# Patient Record
Sex: Female | Born: 1970 | Race: Black or African American | Hispanic: No | Marital: Single | Smoking: Current every day smoker
Health system: Southern US, Community
[De-identification: ages and names within clinical notes are randomized; demographics above are authoritative.]

## PROBLEM LIST (undated history)

## (undated) ENCOUNTER — Emergency Department (HOSPITAL_COMMUNITY): Admission: EM | Payer: Self-pay | Source: Home / Self Care

## (undated) DIAGNOSIS — I341 Nonrheumatic mitral (valve) prolapse: Secondary | ICD-10-CM

## (undated) DIAGNOSIS — I10 Essential (primary) hypertension: Secondary | ICD-10-CM

## (undated) DIAGNOSIS — J42 Unspecified chronic bronchitis: Secondary | ICD-10-CM

## (undated) DIAGNOSIS — D649 Anemia, unspecified: Secondary | ICD-10-CM

## (undated) DIAGNOSIS — L03011 Cellulitis of right finger: Secondary | ICD-10-CM

## (undated) DIAGNOSIS — F319 Bipolar disorder, unspecified: Secondary | ICD-10-CM

## (undated) DIAGNOSIS — J45909 Unspecified asthma, uncomplicated: Secondary | ICD-10-CM

## (undated) DIAGNOSIS — F419 Anxiety disorder, unspecified: Secondary | ICD-10-CM

## (undated) HISTORY — DX: Anxiety disorder, unspecified: F41.9

## (undated) HISTORY — PX: NO PAST SURGERIES: SHX2092

---

## 1998-10-28 ENCOUNTER — Emergency Department (HOSPITAL_COMMUNITY): Admission: EM | Admit: 1998-10-28 | Discharge: 1998-10-28 | Payer: Self-pay | Admitting: Emergency Medicine

## 1999-03-27 ENCOUNTER — Emergency Department (HOSPITAL_COMMUNITY): Admission: EM | Admit: 1999-03-27 | Discharge: 1999-03-27 | Payer: Self-pay | Admitting: Emergency Medicine

## 1999-04-29 ENCOUNTER — Encounter: Payer: Self-pay | Admitting: Family Medicine

## 1999-04-29 ENCOUNTER — Ambulatory Visit (HOSPITAL_COMMUNITY): Admission: RE | Admit: 1999-04-29 | Discharge: 1999-04-29 | Payer: Self-pay | Admitting: Family Medicine

## 1999-05-22 ENCOUNTER — Other Ambulatory Visit: Admission: RE | Admit: 1999-05-22 | Discharge: 1999-05-22 | Payer: Self-pay | Admitting: Family Medicine

## 1999-08-11 ENCOUNTER — Inpatient Hospital Stay (HOSPITAL_COMMUNITY): Admission: AD | Admit: 1999-08-11 | Discharge: 1999-08-11 | Payer: Self-pay | Admitting: Family Medicine

## 1999-09-12 ENCOUNTER — Emergency Department (HOSPITAL_COMMUNITY): Admission: EM | Admit: 1999-09-12 | Discharge: 1999-09-12 | Payer: Self-pay | Admitting: Emergency Medicine

## 2000-05-28 ENCOUNTER — Emergency Department (HOSPITAL_COMMUNITY): Admission: EM | Admit: 2000-05-28 | Discharge: 2000-05-28 | Payer: Self-pay | Admitting: Emergency Medicine

## 2000-05-28 ENCOUNTER — Emergency Department (HOSPITAL_COMMUNITY): Admission: EM | Admit: 2000-05-28 | Discharge: 2000-05-29 | Payer: Self-pay | Admitting: Emergency Medicine

## 2000-05-29 ENCOUNTER — Emergency Department (HOSPITAL_COMMUNITY): Admission: EM | Admit: 2000-05-29 | Discharge: 2000-05-29 | Payer: Self-pay | Admitting: Emergency Medicine

## 2001-12-02 ENCOUNTER — Other Ambulatory Visit: Admission: RE | Admit: 2001-12-02 | Discharge: 2001-12-02 | Payer: Self-pay | Admitting: Family Medicine

## 2002-04-15 ENCOUNTER — Encounter: Admission: RE | Admit: 2002-04-15 | Discharge: 2002-04-15 | Payer: Self-pay | Admitting: Family Medicine

## 2002-04-15 ENCOUNTER — Encounter: Payer: Self-pay | Admitting: Family Medicine

## 2002-09-03 ENCOUNTER — Emergency Department (HOSPITAL_COMMUNITY): Admission: EM | Admit: 2002-09-03 | Discharge: 2002-09-03 | Payer: Self-pay | Admitting: Emergency Medicine

## 2002-10-13 ENCOUNTER — Ambulatory Visit (HOSPITAL_COMMUNITY): Admission: RE | Admit: 2002-10-13 | Discharge: 2002-10-13 | Payer: Self-pay

## 2002-12-14 ENCOUNTER — Ambulatory Visit (HOSPITAL_COMMUNITY): Admission: RE | Admit: 2002-12-14 | Discharge: 2002-12-14 | Payer: Self-pay

## 2003-02-12 ENCOUNTER — Ambulatory Visit (HOSPITAL_COMMUNITY): Admission: RE | Admit: 2003-02-12 | Discharge: 2003-02-12 | Payer: Self-pay

## 2003-03-25 ENCOUNTER — Encounter (HOSPITAL_COMMUNITY): Admission: RE | Admit: 2003-03-25 | Discharge: 2003-04-07 | Payer: Self-pay

## 2003-04-14 ENCOUNTER — Inpatient Hospital Stay (HOSPITAL_COMMUNITY): Admission: AD | Admit: 2003-04-14 | Discharge: 2003-04-16 | Payer: Self-pay

## 2003-04-14 ENCOUNTER — Encounter: Admission: RE | Admit: 2003-04-14 | Discharge: 2003-04-14 | Payer: Self-pay

## 2003-04-15 ENCOUNTER — Encounter (INDEPENDENT_AMBULATORY_CARE_PROVIDER_SITE_OTHER): Payer: Self-pay | Admitting: *Deleted

## 2004-12-13 ENCOUNTER — Emergency Department (HOSPITAL_COMMUNITY): Admission: EM | Admit: 2004-12-13 | Discharge: 2004-12-13 | Payer: Self-pay | Admitting: Emergency Medicine

## 2005-01-22 ENCOUNTER — Emergency Department (HOSPITAL_COMMUNITY): Admission: EM | Admit: 2005-01-22 | Discharge: 2005-01-23 | Payer: Self-pay | Admitting: Emergency Medicine

## 2005-04-24 ENCOUNTER — Other Ambulatory Visit: Admission: RE | Admit: 2005-04-24 | Discharge: 2005-04-24 | Payer: Self-pay | Admitting: Family Medicine

## 2005-12-28 ENCOUNTER — Emergency Department (HOSPITAL_COMMUNITY): Admission: EM | Admit: 2005-12-28 | Discharge: 2005-12-28 | Payer: Self-pay | Admitting: Emergency Medicine

## 2006-04-30 ENCOUNTER — Inpatient Hospital Stay (HOSPITAL_COMMUNITY): Admission: RE | Admit: 2006-04-30 | Discharge: 2006-05-02 | Payer: Self-pay | Admitting: Psychiatry

## 2006-05-01 ENCOUNTER — Ambulatory Visit: Payer: Self-pay | Admitting: Psychiatry

## 2007-01-03 ENCOUNTER — Emergency Department (HOSPITAL_COMMUNITY): Admission: EM | Admit: 2007-01-03 | Discharge: 2007-01-03 | Payer: Self-pay | Admitting: Emergency Medicine

## 2007-05-14 ENCOUNTER — Emergency Department (HOSPITAL_COMMUNITY): Admission: EM | Admit: 2007-05-14 | Discharge: 2007-05-14 | Payer: Self-pay | Admitting: Emergency Medicine

## 2007-06-28 ENCOUNTER — Emergency Department (HOSPITAL_COMMUNITY): Admission: EM | Admit: 2007-06-28 | Discharge: 2007-06-28 | Payer: Self-pay | Admitting: Emergency Medicine

## 2008-11-12 ENCOUNTER — Inpatient Hospital Stay (HOSPITAL_COMMUNITY): Admission: RE | Admit: 2008-11-12 | Discharge: 2008-11-12 | Payer: Self-pay | Admitting: Obstetrics & Gynecology

## 2009-07-15 ENCOUNTER — Emergency Department (HOSPITAL_COMMUNITY): Admission: EM | Admit: 2009-07-15 | Discharge: 2009-07-15 | Payer: Self-pay | Admitting: Emergency Medicine

## 2009-07-16 ENCOUNTER — Emergency Department (HOSPITAL_COMMUNITY): Admission: EM | Admit: 2009-07-16 | Discharge: 2009-07-16 | Payer: Self-pay | Admitting: Emergency Medicine

## 2009-10-22 ENCOUNTER — Inpatient Hospital Stay (HOSPITAL_COMMUNITY): Admission: EM | Admit: 2009-10-22 | Discharge: 2009-10-24 | Payer: Self-pay | Admitting: Emergency Medicine

## 2010-04-19 ENCOUNTER — Emergency Department (HOSPITAL_COMMUNITY): Admission: EM | Admit: 2010-04-19 | Discharge: 2010-04-19 | Payer: Self-pay | Admitting: Emergency Medicine

## 2011-03-14 LAB — URINALYSIS, ROUTINE W REFLEX MICROSCOPIC
Glucose, UA: NEGATIVE mg/dL
Hgb urine dipstick: NEGATIVE
Hgb urine dipstick: NEGATIVE
Ketones, ur: 15 mg/dL — AB
Nitrite: NEGATIVE
Nitrite: NEGATIVE
Protein, ur: 30 mg/dL — AB
Protein, ur: NEGATIVE mg/dL
Specific Gravity, Urine: 1.005 (ref 1.005–1.030)
Specific Gravity, Urine: 1.015 (ref 1.005–1.030)
Urobilinogen, UA: 0.2 mg/dL (ref 0.0–1.0)
Urobilinogen, UA: 1 mg/dL (ref 0.0–1.0)
pH: 5.5 (ref 5.0–8.0)

## 2011-03-14 LAB — BRAIN NATRIURETIC PEPTIDE: Pro B Natriuretic peptide (BNP): 117 pg/mL — ABNORMAL HIGH (ref 0.0–100.0)

## 2011-03-14 LAB — COMPREHENSIVE METABOLIC PANEL
ALT: 21 U/L (ref 0–35)
ALT: 21 U/L (ref 0–35)
ALT: 27 U/L (ref 0–35)
AST: 34 U/L (ref 0–37)
AST: 46 U/L — ABNORMAL HIGH (ref 0–37)
AST: 54 U/L — ABNORMAL HIGH (ref 0–37)
Albumin: 3.1 g/dL — ABNORMAL LOW (ref 3.5–5.2)
Albumin: 3.4 g/dL — ABNORMAL LOW (ref 3.5–5.2)
Albumin: 4.5 g/dL (ref 3.5–5.2)
Alkaline Phosphatase: 66 U/L (ref 39–117)
Alkaline Phosphatase: 71 U/L (ref 39–117)
Alkaline Phosphatase: 84 U/L (ref 39–117)
BUN: 11 mg/dL (ref 6–23)
BUN: 23 mg/dL (ref 6–23)
CO2: 20 mEq/L (ref 19–32)
CO2: 23 mEq/L (ref 19–32)
CO2: 23 mEq/L (ref 19–32)
Calcium: 8 mg/dL — ABNORMAL LOW (ref 8.4–10.5)
Calcium: 8.6 mg/dL (ref 8.4–10.5)
Calcium: 9.8 mg/dL (ref 8.4–10.5)
Chloride: 103 mEq/L (ref 96–112)
Chloride: 90 mEq/L — ABNORMAL LOW (ref 96–112)
Chloride: 99 mEq/L (ref 96–112)
Creatinine, Ser: 0.97 mg/dL (ref 0.4–1.2)
Creatinine, Ser: 3.67 mg/dL — ABNORMAL HIGH (ref 0.4–1.2)
GFR calc Af Amer: 17 mL/min — ABNORMAL LOW (ref >60)
GFR calc Af Amer: >60 mL/min (ref >60)
GFR calc non Af Amer: 14 mL/min — ABNORMAL LOW (ref >60)
GFR calc non Af Amer: >60 mL/min (ref >60)
GFR calc non Af Amer: >60 mL/min (ref >60)
Glucose, Bld: 77 mg/dL (ref 70–99)
Glucose, Bld: 84 mg/dL (ref 70–99)
Glucose, Bld: 97 mg/dL (ref 70–99)
Potassium: 3.8 mEq/L (ref 3.5–5.1)
Potassium: 3.9 mEq/L (ref 3.5–5.1)
Sodium: 125 mEq/L — ABNORMAL LOW (ref 135–145)
Sodium: 127 mEq/L — ABNORMAL LOW (ref 135–145)
Sodium: 131 mEq/L — ABNORMAL LOW (ref 135–145)
Total Bilirubin: 0.5 mg/dL (ref 0.3–1.2)
Total Bilirubin: 0.8 mg/dL (ref 0.3–1.2)
Total Protein: 6.3 g/dL (ref 6.0–8.3)
Total Protein: 8.2 g/dL (ref 6.0–8.3)

## 2011-03-14 LAB — CBC
HCT: 28.4 % — ABNORMAL LOW (ref 36.0–46.0)
Hemoglobin: 9.7 g/dL — ABNORMAL LOW (ref 12.0–15.0)
MCHC: 34.2 g/dL (ref 30.0–36.0)
MCV: 94.2 fL (ref 78.0–100.0)
MCV: 95.7 fL (ref 78.0–100.0)
MCV: 96 fL (ref 78.0–100.0)
Platelets: 175 10*3/uL (ref 150–400)
Platelets: 187 10*3/uL (ref 150–400)
WBC: 5.6 10*3/uL (ref 4.0–10.5)
WBC: 7.6 10*3/uL (ref 4.0–10.5)

## 2011-03-14 LAB — FOLATE RBC: RBC Folate: 257 ng/mL (ref 180–600)

## 2011-03-14 LAB — URINE MICROSCOPIC-ADD ON

## 2011-03-14 LAB — DIFFERENTIAL
Basophils Absolute: 0.1 10*3/uL (ref 0.0–0.1)
Basophils Relative: 1 % (ref 0–1)
Basophils Relative: 1 % (ref 0–1)
Eosinophils Absolute: 0 10*3/uL (ref 0.0–0.7)
Eosinophils Absolute: 0.1 10*3/uL (ref 0.0–0.7)
Eosinophils Relative: 1 % (ref 0–5)
Lymphocytes Relative: 38 % (ref 12–46)
Lymphs Abs: 2.4 10*3/uL (ref 0.7–4.0)
Lymphs Abs: 2.9 10*3/uL (ref 0.7–4.0)
Monocytes Absolute: 0.7 10*3/uL (ref 0.1–1.0)
Monocytes Relative: 10 % (ref 3–12)
Neutro Abs: 2.4 10*3/uL (ref 1.7–7.7)
Neutro Abs: 3.9 10*3/uL (ref 1.7–7.7)
Neutrophils Relative %: 43 % (ref 43–77)
Neutrophils Relative %: 51 % (ref 43–77)

## 2011-03-14 LAB — POCT PREGNANCY, URINE: Preg Test, Ur: NEGATIVE

## 2011-03-14 LAB — POCT CARDIAC MARKERS
CKMB, poc: 3.4 ng/mL (ref 1.0–8.0)
CKMB, poc: 5 ng/mL (ref 1.0–8.0)
Myoglobin, poc: 476 ng/mL (ref 12–200)
Myoglobin, poc: >500 ng/mL (ref 12–200)
Troponin i, poc: <0.05 ng/mL (ref 0.00–0.09)
Troponin i, poc: <0.05 ng/mL (ref 0.00–0.09)

## 2011-03-14 LAB — CARDIAC PANEL(CRET KIN+CKTOT+MB+TROPI)
CK, MB: 6.8 ng/mL — ABNORMAL HIGH (ref 0.3–4.0)
Relative Index: 3.4 — ABNORMAL HIGH (ref 0.0–2.5)
Total CK: 199 U/L — ABNORMAL HIGH (ref 7–177)
Troponin I: 0.01 ng/mL (ref 0.00–0.06)

## 2011-03-14 LAB — POCT I-STAT, CHEM 8
BUN: 24 mg/dL — ABNORMAL HIGH (ref 6–23)
Creatinine, Ser: 3.8 mg/dL — ABNORMAL HIGH (ref 0.4–1.2)
Glucose, Bld: 92 mg/dL (ref 70–99)
Sodium: 124 mEq/L — ABNORMAL LOW (ref 135–145)
TCO2: 22 mmol/L (ref 0–100)

## 2011-03-14 LAB — IRON AND TIBC
Iron: 71 ug/dL (ref 42–135)
Saturation Ratios: 33 % (ref 20–55)
TIBC: 213 ug/dL — ABNORMAL LOW (ref 250–470)
UIBC: 142 ug/dL

## 2011-03-14 LAB — T3, FREE: T3, Free: 2.7 pg/mL (ref 2.3–4.2)

## 2011-03-14 LAB — URINE CULTURE
Colony Count: NO GROWTH
Culture: NO GROWTH

## 2011-03-14 LAB — RETICULOCYTES
RBC.: 3.09 MIL/uL — ABNORMAL LOW (ref 3.87–5.11)
Retic Count, Absolute: 12.4 10*3/uL — ABNORMAL LOW (ref 19.0–186.0)

## 2011-03-14 LAB — CORTISOL-AM, BLOOD: Cortisol - AM: 20.9 ug/dL (ref 4.3–22.4)

## 2011-03-14 LAB — ALDOSTERONE: Aldosterone, Serum: 4 ng/dL

## 2011-04-27 NOTE — H&P (Signed)
NAMEJACQELINE, [name]                         ACCOUNT NO.:  [id number]   MEDICAL RECORD NO.:  [id number]                   PATIENT TYPE:  INP   LOCATION:  9169                                 FACILITY:  [facility]   PHYSICIAN:  Ronda Fairly. Galen Daft, M.D.              DATE OF BIRTH:  [birth year]   DATE OF ADMISSION:  04/14/2003  DATE OF DISCHARGE:                                HISTORY & PHYSICAL   CHIEF COMPLAINT:  Abnormal NST .   HISTORY OF PRESENT ILLNESS:  This patient is 38 weeks, 5 five days gestation  based on first trimester ultrasound, November 4, which was equal to 12 weeks  and 4 days.  She has had an unremarkable prenatal course with the exception  of being small for dates and ultrasounds were therefore implemented for  serial testing as well as a nonstress test weekly.  She is a smoker and she  was counseled about that.  She has a history of syphilis treated and she was  negative this pregnancy.  The patient is negative for GBS.  Patient had a  measurement of 33+ weeks on 03/24/03.  This was an ultrasound where we  implemented testing.  She had had testing without difficulty up until today.  The NST today showed some variables.   PRENATAL LABORATORY:  Blood type B positive.  Antibody screen negative.  Hemoglobin is 10.3.  Rubella immune.  RPR nonreactive.  Hepatitis B  negative.  HIV negative.  Chlamydia negative.  Gonorrhea negative.  Pap  smear within normal limits.  Group-B strep negative.  Triple screen within  normal limits.   PAST MEDICAL HISTORY:  Syphilis, smoker.  Otherwise unremarkable.   PAST OBSTETRIC HISTORY:  Para 1, spontaneous vaginal delivery, baby boy,  uncomplicated pregnancy.  He weighed 6 pounds 7 ounces in 1997.  No prior  miscarriages.  No congenital abnormalities.   FAMILY HISTORY:  Heart disease.  Otherwise unremarkable.   PHYSICAL EXAMINATION:  GENERAL:  Alert, oriented, no apparent distress.  HEENT:  Head and neck examination was negative.  RESPIRATORY:  Clear.  CARDIAC:  Regular, rate and rhythm.  ABDOMEN:  Soft, nontender.  Estimated fetal weight 6 pounds.  Vertex  presentation.  PELVIC:  Cervix is posterior, ripe, firm and approximately 1-2 cm.  The  cervix is less then 50% effaced.  Multiparous.  The vertex presenting and at  -2.  The external vaginal area had no lesions suggestive of herpes or other  acute infection.  The patient has normal external genitalia.  No lesions of  the vagina.  There was no evidence of ruptured membranes by examination.  Negative nitrazine, negative pooling.  The fetal heart rate is 140s-150s  with occasional variable decelerations.  Slow return occasionally.  Otherwise unremarkable.  There are contractions without __________  .   ASSESSMENT:  A 38 week 5 day gestation pregnancy with nonreactive nonstress  test today and she  is therefore set up for suction for borderline IUGR and  growth restriction with unreassuring test.                                               Ronda Fairly. Galen Daft, M.D.   NJT/MEDQ  D:  04/14/2003  T:  04/14/2003  Job:  [id number]

## 2011-04-27 NOTE — Discharge Summary (Signed)
[name], MALACARA NO.:  [id number]   MEDICAL RECORD NO.:  [id number]          PATIENT TYPE:  IPS   LOCATION:  0401                          FACILITY:  [facility]   PHYSICIAN:  Geoffery Lyons, M.D.      DATE OF BIRTH:  26-Oct-1971   DATE OF ADMISSION:  04/30/2006  DATE OF DISCHARGE:  05/02/2006                                 DISCHARGE SUMMARY   CHIEF COMPLAINT AND PRESENT ILLNESS:  This was the first admission to [facility] for this 40 year old single African-American female  voluntarily admitted.  History of depression, crying spells, not sleep.  Denied suicidal ideation.  Chart indicates positive for auditory  hallucinations, recently laid off from work.  That is the major stressor.  History of schizophrenia in the family.  Per chart, she drinks two 40-ounces  per day.   PAST PSYCHIATRIC HISTORY:  First time at [facility].  No previous  treatment.   ALCOHOL/DRUG HISTORY:  Claimed occasional use of alcohol.  No other  substances.   MEDICAL HISTORY:  Arterial hypertension.   MEDICATIONS:  Hydrochlorothiazide 25 mg per day, Xanax 0.25 mg twice a day  for seven years, Zoloft 50 mg daily for the last three years.   PHYSICAL EXAMINATION:  Performed and failed to show any acute findings.   LABORATORY DATA:  Not available in the chart.   MENTAL STATUS EXAM:  Fully alert, cooperative female.  Little eye contact.  Speech clear, normal rate, tempo and production.  Mood depressed.  Affect  constricted.  Thought processes logical, coherent and relevant.  Claims no  hallucinations but earlier there was some information that she was indeed  having some.  Denied any suicidal or homicidal ideation.  Cognition was well-  preserved.   ADMISSION DIAGNOSES:  AXIS I:  Major depression, rule out psychotic  features.  AXIS II:  No diagnosis.  AXIS III:  Arterial hypertension.  AXIS IV:  Moderate.  AXIS V:  GAF upon admission 35; highest GAF in the last  year 60.   HOSPITAL COURSE:  She was admitted.  She was started in individual and group  psychotherapy.  She was given Ambien for sleep.  She was given Zyprexa Zydis  5 mg at bedtime.  Xanax was discontinued as she was given some Librium as  needed.  She endorsed that she got depressed by taking Zoloft and Xanax.  Just said she was doing okay and she did not want to take the Zoloft  anymore.  Got more depressed after she discontinued the medication.  Was  crying with a lot of sadness, decreased sleep, decreased energy, decreased  motivation.  Denied any auditory hallucinations but there was some report of  the same.  On evaluation, she was reserved, guarded, affect was constricted.  Although she denies any hallucinations while she was talking, she repeated  taps the top of her head in some sort of pattern.  Endorsed financial  difficulties, being unemployed.  Her job ceased, several people were let go  of.  She stated that they are probably going to be rehired  once the summer  is over but endorsed that she needed to find another job as financially she  cannot cope.  On May 02, 2006, objectively, she was better.  There were no  suicidal or homicidal ideation.  No hallucinations.  No delusions.  Slept  through the night.  She was willing to continue the Zoloft and the Zyprexa  as she felt it was helping her with sleep as well as the anxiety and felt  much clearer.  No evidence of the previously described behavior.   DISCHARGE DIAGNOSES:  AXIS I:  Major depression, rule out psychotic  features.  AXIS II:  No diagnosis.  AXIS III:  Arterial hypertension.  AXIS IV:  Moderate.  AXIS V:  GAF upon discharge 50-55.   DISCHARGE MEDICATIONS:  1.  Ambien 10 mg at night.  2.  Zoloft 50 mg per day.  3.  Hydrochlorothiazide 25 mg per day.  4.  Zyprexa Zydis 5 mg at night.   FOLLOWUP:  [facility].      Geoffery Lyons, M.D.  Electronically Signed     IL/MEDQ  D:  05/16/2006  T:   05/17/2006  Job:  [id number]

## 2011-06-16 ENCOUNTER — Emergency Department (HOSPITAL_COMMUNITY)
Admission: EM | Admit: 2011-06-16 | Discharge: 2011-06-16 | Disposition: A | Discharge status: Home / Self Care | Payer: Self-pay | Attending: Emergency Medicine | Admitting: Emergency Medicine

## 2011-06-16 DIAGNOSIS — F329 Major depressive disorder, single episode, unspecified: Secondary | ICD-10-CM | POA: Insufficient documentation

## 2011-06-16 DIAGNOSIS — T63391A Toxic effect of venom of other spider, accidental (unintentional), initial encounter: Secondary | ICD-10-CM | POA: Insufficient documentation

## 2011-06-16 DIAGNOSIS — L298 Other pruritus: Secondary | ICD-10-CM | POA: Insufficient documentation

## 2011-06-16 DIAGNOSIS — T6391XA Toxic effect of contact with unspecified venomous animal, accidental (unintentional), initial encounter: Secondary | ICD-10-CM | POA: Insufficient documentation

## 2011-06-16 DIAGNOSIS — R21 Rash and other nonspecific skin eruption: Secondary | ICD-10-CM | POA: Insufficient documentation

## 2011-06-16 DIAGNOSIS — L2989 Other pruritus: Secondary | ICD-10-CM | POA: Insufficient documentation

## 2011-06-16 DIAGNOSIS — I10 Essential (primary) hypertension: Secondary | ICD-10-CM | POA: Insufficient documentation

## 2011-06-16 DIAGNOSIS — F3289 Other specified depressive episodes: Secondary | ICD-10-CM | POA: Insufficient documentation

## 2011-06-16 DIAGNOSIS — J45909 Unspecified asthma, uncomplicated: Secondary | ICD-10-CM | POA: Insufficient documentation

## 2011-09-13 LAB — URINALYSIS, ROUTINE W REFLEX MICROSCOPIC
Glucose, UA: NEGATIVE mg/dL
Leukocytes, UA: NEGATIVE
Protein, ur: NEGATIVE mg/dL
Specific Gravity, Urine: 1.015 (ref 1.005–1.030)
Urobilinogen, UA: 0.2 mg/dL (ref 0.0–1.0)

## 2011-09-13 LAB — URINE MICROSCOPIC-ADD ON

## 2012-01-17 ENCOUNTER — Other Ambulatory Visit: Payer: Self-pay | Admitting: Internal Medicine

## 2012-01-17 DIAGNOSIS — Z1231 Encounter for screening mammogram for malignant neoplasm of breast: Secondary | ICD-10-CM

## 2012-02-07 ENCOUNTER — Ambulatory Visit
Admission: RE | Admit: 2012-02-07 | Discharge: 2012-02-07 | Disposition: A | Discharge status: Home / Self Care | Payer: Medicaid Other | Source: Ambulatory Visit | Attending: Internal Medicine | Admitting: Internal Medicine

## 2012-02-07 DIAGNOSIS — Z1231 Encounter for screening mammogram for malignant neoplasm of breast: Secondary | ICD-10-CM

## 2013-01-13 ENCOUNTER — Other Ambulatory Visit: Payer: Self-pay | Admitting: Internal Medicine

## 2013-01-13 DIAGNOSIS — Z1231 Encounter for screening mammogram for malignant neoplasm of breast: Secondary | ICD-10-CM

## 2013-02-12 ENCOUNTER — Ambulatory Visit
Admission: RE | Admit: 2013-02-12 | Discharge: 2013-02-12 | Disposition: A | Discharge status: Home / Self Care | Payer: Medicaid Other | Source: Ambulatory Visit | Attending: Internal Medicine | Admitting: Internal Medicine

## 2014-02-17 ENCOUNTER — Other Ambulatory Visit: Payer: Self-pay

## 2014-02-17 DIAGNOSIS — Z1231 Encounter for screening mammogram for malignant neoplasm of breast: Secondary | ICD-10-CM

## 2014-03-04 ENCOUNTER — Ambulatory Visit
Admission: RE | Admit: 2014-03-04 | Discharge: 2014-03-04 | Disposition: A | Discharge status: Home / Self Care | Payer: Medicaid Other | Source: Ambulatory Visit

## 2014-03-04 DIAGNOSIS — Z1231 Encounter for screening mammogram for malignant neoplasm of breast: Secondary | ICD-10-CM

## 2014-06-04 ENCOUNTER — Emergency Department (HOSPITAL_COMMUNITY): Payer: Medicaid Other

## 2014-06-04 ENCOUNTER — Encounter (HOSPITAL_COMMUNITY): Payer: Self-pay | Admitting: Emergency Medicine

## 2014-06-04 ENCOUNTER — Inpatient Hospital Stay (HOSPITAL_COMMUNITY)
Admission: EM | Admit: 2014-06-04 | Discharge: 2014-06-05 | DRG: 191 | Disposition: A | Discharge status: Home / Self Care | Payer: Medicaid Other | Attending: Internal Medicine | Admitting: Internal Medicine

## 2014-06-04 DIAGNOSIS — F410 Panic disorder [episodic paroxysmal anxiety] without agoraphobia: Secondary | ICD-10-CM | POA: Diagnosis present

## 2014-06-04 DIAGNOSIS — F17200 Nicotine dependence, unspecified, uncomplicated: Secondary | ICD-10-CM | POA: Diagnosis present

## 2014-06-04 DIAGNOSIS — J45909 Unspecified asthma, uncomplicated: Secondary | ICD-10-CM

## 2014-06-04 DIAGNOSIS — E876 Hypokalemia: Secondary | ICD-10-CM | POA: Diagnosis present

## 2014-06-04 DIAGNOSIS — I10 Essential (primary) hypertension: Secondary | ICD-10-CM | POA: Diagnosis present

## 2014-06-04 DIAGNOSIS — E874 Mixed disorder of acid-base balance: Secondary | ICD-10-CM | POA: Diagnosis present

## 2014-06-04 DIAGNOSIS — D649 Anemia, unspecified: Secondary | ICD-10-CM

## 2014-06-04 DIAGNOSIS — R7402 Elevation of levels of lactic acid dehydrogenase (LDH): Secondary | ICD-10-CM | POA: Diagnosis present

## 2014-06-04 DIAGNOSIS — F419 Anxiety disorder, unspecified: Secondary | ICD-10-CM | POA: Diagnosis present

## 2014-06-04 DIAGNOSIS — R7401 Elevation of levels of liver transaminase levels: Secondary | ICD-10-CM

## 2014-06-04 DIAGNOSIS — J984 Other disorders of lung: Secondary | ICD-10-CM

## 2014-06-04 DIAGNOSIS — J441 Chronic obstructive pulmonary disease with (acute) exacerbation: Principal | ICD-10-CM | POA: Diagnosis present

## 2014-06-04 DIAGNOSIS — R0602 Shortness of breath: Secondary | ICD-10-CM | POA: Diagnosis present

## 2014-06-04 DIAGNOSIS — R740 Nonspecific elevation of levels of transaminase and lactic acid dehydrogenase [LDH]: Secondary | ICD-10-CM

## 2014-06-04 DIAGNOSIS — E8729 Other acidosis: Secondary | ICD-10-CM | POA: Diagnosis present

## 2014-06-04 DIAGNOSIS — E872 Acidosis: Secondary | ICD-10-CM | POA: Diagnosis present

## 2014-06-04 DIAGNOSIS — F411 Generalized anxiety disorder: Secondary | ICD-10-CM | POA: Diagnosis present

## 2014-06-04 DIAGNOSIS — J45901 Unspecified asthma with (acute) exacerbation: Principal | ICD-10-CM

## 2014-06-04 DIAGNOSIS — I959 Hypotension, unspecified: Secondary | ICD-10-CM | POA: Diagnosis present

## 2014-06-04 DIAGNOSIS — F1210 Cannabis abuse, uncomplicated: Secondary | ICD-10-CM | POA: Diagnosis present

## 2014-06-04 DIAGNOSIS — E871 Hypo-osmolality and hyponatremia: Secondary | ICD-10-CM | POA: Diagnosis present

## 2014-06-04 DIAGNOSIS — J449 Chronic obstructive pulmonary disease, unspecified: Secondary | ICD-10-CM | POA: Diagnosis present

## 2014-06-04 DIAGNOSIS — J309 Allergic rhinitis, unspecified: Secondary | ICD-10-CM | POA: Diagnosis present

## 2014-06-04 DIAGNOSIS — R0902 Hypoxemia: Secondary | ICD-10-CM | POA: Diagnosis present

## 2014-06-04 DIAGNOSIS — J439 Emphysema, unspecified: Secondary | ICD-10-CM

## 2014-06-04 DIAGNOSIS — R7989 Other specified abnormal findings of blood chemistry: Secondary | ICD-10-CM

## 2014-06-04 DIAGNOSIS — R748 Abnormal levels of other serum enzymes: Secondary | ICD-10-CM | POA: Diagnosis present

## 2014-06-04 DIAGNOSIS — I059 Rheumatic mitral valve disease, unspecified: Secondary | ICD-10-CM | POA: Diagnosis present

## 2014-06-04 DIAGNOSIS — F1910 Other psychoactive substance abuse, uncomplicated: Secondary | ICD-10-CM

## 2014-06-04 HISTORY — DX: Essential (primary) hypertension: I10

## 2014-06-04 HISTORY — DX: Unspecified asthma, uncomplicated: J45.909

## 2014-06-04 HISTORY — DX: Nonrheumatic mitral (valve) prolapse: I34.1

## 2014-06-04 LAB — CBC WITH DIFFERENTIAL/PLATELET
BASOS ABS: 0 10*3/uL (ref 0.0–0.1)
BASOS PCT: 0 % (ref 0–1)
EOS PCT: 1 % (ref 0–5)
Eosinophils Absolute: 0 10*3/uL (ref 0.0–0.7)
HEMATOCRIT: 34 % — AB (ref 36.0–46.0)
HEMOGLOBIN: 11.3 g/dL — AB (ref 12.0–15.0)
Lymphocytes Relative: 42 % (ref 12–46)
Lymphs Abs: 2.5 10*3/uL (ref 0.7–4.0)
MCH: 30.3 pg (ref 26.0–34.0)
MCHC: 33.2 g/dL (ref 30.0–36.0)
MCV: 91.2 fL (ref 78.0–100.0)
MONO ABS: 0.6 10*3/uL (ref 0.1–1.0)
MONOS PCT: 10 % (ref 3–12)
NEUTROS ABS: 2.8 10*3/uL (ref 1.7–7.7)
Neutrophils Relative %: 47 % (ref 43–77)
Platelets: 197 10*3/uL (ref 150–400)
RBC: 3.73 MIL/uL — ABNORMAL LOW (ref 3.87–5.11)
RDW: 13.6 % (ref 11.5–15.5)
WBC: 5.8 10*3/uL (ref 4.0–10.5)

## 2014-06-04 LAB — URINALYSIS, ROUTINE W REFLEX MICROSCOPIC
Bilirubin Urine: NEGATIVE
GLUCOSE, UA: NEGATIVE mg/dL
Hgb urine dipstick: NEGATIVE
KETONES UR: NEGATIVE mg/dL
NITRITE: NEGATIVE
PROTEIN: NEGATIVE mg/dL
Specific Gravity, Urine: 1.011 (ref 1.005–1.030)
Urobilinogen, UA: 1 mg/dL (ref 0.0–1.0)
pH: 5.5 (ref 5.0–8.0)

## 2014-06-04 LAB — OSMOLALITY, URINE: OSMOLALITY UR: 178 mosm/kg — AB (ref 390–1090)

## 2014-06-04 LAB — I-STAT ARTERIAL BLOOD GAS, ED
ACID-BASE EXCESS: 1 mmol/L (ref 0.0–2.0)
Bicarbonate: 24.3 mEq/L — ABNORMAL HIGH (ref 20.0–24.0)
O2 SAT: 99 %
PO2 ART: 139 mmHg — AB (ref 80.0–100.0)
TCO2: 25 mmol/L (ref 0–100)
pCO2 arterial: 34.3 mmHg — ABNORMAL LOW (ref 35.0–45.0)
pH, Arterial: 7.458 — ABNORMAL HIGH (ref 7.350–7.450)

## 2014-06-04 LAB — URINE MICROSCOPIC-ADD ON

## 2014-06-04 LAB — RAPID URINE DRUG SCREEN, HOSP PERFORMED
Amphetamines: NOT DETECTED
BARBITURATES: NOT DETECTED
Benzodiazepines: NOT DETECTED
COCAINE: NOT DETECTED
Opiates: NOT DETECTED
Tetrahydrocannabinol: POSITIVE — AB

## 2014-06-04 LAB — COMPREHENSIVE METABOLIC PANEL
ALBUMIN: 3.5 g/dL (ref 3.5–5.2)
ALK PHOS: 215 U/L — AB (ref 39–117)
ALT: 123 U/L — AB (ref 0–35)
AST: 153 U/L — AB (ref 0–37)
BILIRUBIN TOTAL: 1.4 mg/dL — AB (ref 0.3–1.2)
BUN: 6 mg/dL (ref 6–23)
CHLORIDE: 81 meq/L — AB (ref 96–112)
CO2: 21 mEq/L (ref 19–32)
Calcium: 9.3 mg/dL (ref 8.4–10.5)
Creatinine, Ser: 1.03 mg/dL (ref 0.50–1.10)
GFR calc Af Amer: 77 mL/min — ABNORMAL LOW (ref >90)
GFR calc non Af Amer: 66 mL/min — ABNORMAL LOW (ref >90)
Glucose, Bld: 125 mg/dL — ABNORMAL HIGH (ref 70–99)
POTASSIUM: 3.5 meq/L — AB (ref 3.7–5.3)
SODIUM: 125 meq/L — AB (ref 137–147)
TOTAL PROTEIN: 7.1 g/dL (ref 6.0–8.3)

## 2014-06-04 LAB — TSH: TSH: 0.507 u[IU]/mL (ref 0.350–4.500)

## 2014-06-04 LAB — MAGNESIUM: Magnesium: 1.4 mg/dL — ABNORMAL LOW (ref 1.5–2.5)

## 2014-06-04 LAB — ETHANOL: Alcohol, Ethyl (B): <11 mg/dL (ref 0–11)

## 2014-06-04 LAB — PROTIME-INR
INR: 1.1 (ref 0.00–1.49)
Prothrombin Time: 14.2 seconds (ref 11.6–15.2)

## 2014-06-04 LAB — I-STAT CG4 LACTIC ACID, ED: Lactic Acid, Venous: 1.83 mmol/L (ref 0.5–2.2)

## 2014-06-04 LAB — OSMOLALITY: OSMOLALITY: 256 mosm/kg — AB (ref 275–300)

## 2014-06-04 LAB — GAMMA GT: GGT: 472 U/L — ABNORMAL HIGH (ref 7–51)

## 2014-06-04 LAB — PHOSPHORUS: Phosphorus: 3.9 mg/dL (ref 2.3–4.6)

## 2014-06-04 LAB — TROPONIN I: Troponin I: <0.3 ng/mL (ref <0.30)

## 2014-06-04 LAB — POC URINE PREG, ED: Preg Test, Ur: NEGATIVE

## 2014-06-04 LAB — APTT: aPTT: 26 seconds (ref 24–37)

## 2014-06-04 LAB — D-DIMER, QUANTITATIVE: D-Dimer, Quant: 1.22 ug/mL-FEU — ABNORMAL HIGH (ref 0.00–0.48)

## 2014-06-04 MED ORDER — SODIUM CHLORIDE 0.9 % IV SOLN
INTRAVENOUS | Status: AC
  Administered 2014-06-04 – 2014-06-05 (×2): via INTRAVENOUS

## 2014-06-04 MED ORDER — POTASSIUM CHLORIDE CRYS ER 20 MEQ PO TBCR
40.0000 meq | EXTENDED_RELEASE_TABLET | Freq: Once | ORAL | Status: AC
  Administered 2014-06-04: 40 meq via ORAL
  Filled 2014-06-04: qty 2

## 2014-06-04 MED ORDER — MAGNESIUM OXIDE 400 (241.3 MG) MG PO TABS
400.0000 mg | ORAL_TABLET | Freq: Once | ORAL | Status: AC
  Administered 2014-06-04: 400 mg via ORAL
  Filled 2014-06-04: qty 1

## 2014-06-04 MED ORDER — ENOXAPARIN SODIUM 40 MG/0.4ML ~~LOC~~ SOLN
40.0000 mg | SUBCUTANEOUS | Status: DC
  Filled 2014-06-04 (×2): qty 0.4

## 2014-06-04 MED ORDER — IPRATROPIUM-ALBUTEROL 0.5-2.5 (3) MG/3ML IN SOLN
3.0000 mL | Freq: Once | RESPIRATORY_TRACT | Status: AC
  Administered 2014-06-04: 3 mL via RESPIRATORY_TRACT
  Filled 2014-06-04: qty 3

## 2014-06-04 MED ORDER — SODIUM CHLORIDE 0.9 % IJ SOLN
3.0000 mL | Freq: Two times a day (BID) | INTRAMUSCULAR | Status: DC
  Administered 2014-06-04: 3 mL via INTRAVENOUS

## 2014-06-04 MED ORDER — IOHEXOL 350 MG/ML SOLN
80.0000 mL | Freq: Once | INTRAVENOUS | Status: AC | PRN
  Administered 2014-06-04: 80 mL via INTRAVENOUS

## 2014-06-04 MED ORDER — ALBUTEROL SULFATE (2.5 MG/3ML) 0.083% IN NEBU: 2.5000 mg | INHALATION_SOLUTION | Freq: Four times a day (QID) | RESPIRATORY_TRACT | Status: DC | PRN

## 2014-06-04 MED ORDER — SODIUM CHLORIDE 0.9 % IV BOLUS (SEPSIS)
1000.0000 mL | Freq: Once | INTRAVENOUS | Status: AC
  Administered 2014-06-04: 1000 mL via INTRAVENOUS

## 2014-06-04 MED ORDER — LORAZEPAM 2 MG/ML IJ SOLN
1.0000 mg | Freq: Once | INTRAMUSCULAR | Status: AC
  Administered 2014-06-04: 1 mg via INTRAVENOUS
  Filled 2014-06-04: qty 1

## 2014-06-04 NOTE — ED Notes (Signed)
Portable xray at bedside.

## 2014-06-04 NOTE — Progress Notes (Signed)
Pt w/o complaints of dizziness, headache or any pain.

## 2014-06-04 NOTE — H&P (Signed)
Date: 06/04/2014               Patient Name:  [name] MRN: [id number]  DOB: 20-Jun-1971 Age / Sex: 43 y.o., female   PCP: No Pcp Per Patient           Medical Service: Internal Medicine Teaching Service         Attending Physician: Dr. Axel Filler, MD    First Contact: Dr. Michail Jewels, MD Pager: [phone] (7AM-5PM Mon-Fri)  Second Contact: Dr. Jessee Avers, MD Pager: [phone]       After Hours (After 5p/  First Contact Pager: [phone]  weekends / holidays): Second Contact Pager: [phone]    Most Recent Discharge Date:  06/16/11  Chief Complaint:  Chief Complaint  Patient presents with  . Shortness of Breath       History of Present Illness:  [name] is a 43 y.o. female who has a past medical history of intermittent asthma, anxiety/panic attacks, HTN, and MVP who presents to ED with dyspnea.  She went outside this AM to look at her garden and came back inside and began feeling SOB.  She had the air conditioning on and felt that her symptoms may have been related to being exposed to the cold air.  The episode lasted about 10 minutes and was somewhat relieved with her albuterol inhaler which she has not used in 4 months.  She also endorses mild wheezing, HA, generalized weakness (her legs and arms felt "heavy"), slight lightheadedness, mild dizziness, and seeing spots.  She denies any chest tightness, cough, diaphoresis, fever/chills, N/V/D/C, abdominal pain, urinary symptoms, or changes in bowel habits.  No recent illness or stressors.  She reports decreased appetite since her mom passed away and has been drinking ensure to try to increase her caloric intake.  She smokes 1pack cig/2 wks, drinks alcohol occasionally, but denies recent THC use.  She does have a h/o panic attacks and reports this felt similar to her panic attacks in the past.    According to EMS, her SpO2 was in the 80s?  In the ED, she was given ativan, duonebs, and 1L NS bolus.  She is saturating 100%  on RA.    Meds: Current Facility-Administered Medications  Medication Dose Route Frequency Provider Last Rate Last Dose  . 0.9 %  sodium chloride infusion   Intravenous Continuous Jessee Avers, MD 100 mL/hr at 06/04/14 2007    . albuterol (PROVENTIL) (2.5 MG/3ML) 0.083% nebulizer solution 2.5 mg  2.5 mg Nebulization Q6H PRN Jessee Avers, MD      . enoxaparin (LOVENOX) injection 40 mg  40 mg Subcutaneous Q24H Jessee Avers, MD      . magnesium oxide (MAG-OX) tablet 400 mg  400 mg Oral Once Jones Bales, MD      . potassium chloride SA (K-DUR,KLOR-CON) CR tablet 40 mEq  40 mEq Oral Once Jones Bales, MD      . sodium chloride 0.9 % injection 3 mL  3 mL Intravenous Q12H Jessee Avers, MD   3 mL at 06/04/14 2005    Prescriptions prior to admission  Medication Sig Dispense Refill  . albuterol (PROVENTIL HFA;VENTOLIN HFA) 108 (90 BASE) MCG/ACT inhaler Inhale 2 puffs into the lungs every 6 (six) hours as needed for wheezing or shortness of breath.      . Brompheniramine-Phenylephrine (COLD & ALLERGY PO) Take 2 tablets by mouth every 4 (four) hours as needed. For sinus      .  lisinopril-hydrochlorothiazide (PRINZIDE,ZESTORETIC) 20-12.5 MG per tablet Take 1 tablet by mouth daily.      . mometasone (NASONEX) 50 MCG/ACT nasal spray Place 2 sprays into the nose daily as needed. allergies        Allergies: Allergies as of 06/04/2014  . (No Known Allergies)    PMH: Past Medical History  Diagnosis Date  . Asthma   . Hypertension   . Mitral valve prolapse     PSH: History reviewed. No pertinent past surgical history.  FH: No family history on file.  SH: History  Substance Use Topics  . Smoking status: Current Every Day Smoker -- 0.25 packs/day    Types: Cigarettes  . Smokeless tobacco: Not on file  . Alcohol Use: Yes     Comment: occassionally    Review of Systems: Pertinent items are noted in HPI.  Physical Exam: BP 80/42  Pulse 87  Temp(Src) 98.4 F (36.9  C) (Oral)  Resp 20  Ht 5' 4" (1.626 m)  Wt 100 lb 8 oz (45.587 kg)  BMI 17.24 kg/m2  SpO2 99%  Physical Exam Constitutional: Vital signs reviewed.  Patient appears chronically ill, cachetic appearing much older than her stated age.  She is cooperative with our exam.  Head: Normocephalic and atraumatic Eyes: PERRL, EOMI, conjunctivae normal, no scleral icterus.  Neck: Supple, Trachea midline .  Cardiovascular: RRR, distant heart sounds, no MRG, pulses symmetric and intact bilaterally Pulmonary/Chest: normal respiratory effort, CTAB, no wheezes, rales, or rhonchi Abdominal: Thin. Soft. Non-tender, non-distended, bowel sounds are normal, no masses, organomegaly, or guarding present.  Musculoskeletal: No joint deformities, erythema, or stiffness Extremities: Tattoo over her RUE  Neurological: A&O x3, cranial nerve II-XII are grossly intact, no focal motor deficit, sensory intact to light touch bilaterally.  Skin: Warm, dry and intact. No rash, cyanosis, or clubbing.  Psychiatric: Normal mood and affect.   Lab results:  Basic Metabolic Panel:  Recent Labs  06/04/14 1312 06/04/14 1445 06/04/14 1819  NA 125*  --   --   K 3.5*  --   --   CL 81*  --   --   CO2 21  --   --   GLUCOSE 125*  --   --   BUN 6  --   --   CREATININE 1.03  --   --   CALCIUM 9.3  --   --   MG  --  1.4*  --   PHOS  --   --  3.9   Anion Gap: 23  Calcium/Magnesium/Phosphorus:  Recent Labs Lab 06/04/14 1312 06/04/14 1445 06/04/14 1819  CALCIUM 9.3  --   --   MG  --  1.4*  --   PHOS  --   --  3.9    Liver Function Tests:  Recent Labs  06/04/14 1312  AST 153*  ALT 123*  ALKPHOS 215*  BILITOT 1.4*  PROT 7.1  ALBUMIN 3.5   No results found for this basename: LIPASE, AMYLASE,  in the last 72 hours No results found for this basename: AMMONIA,  in the last 72 hours  CBC: Lab Results  Component Value Date   WBC 5.8 06/04/2014   HGB 11.3* 06/04/2014   HCT 34.0* 06/04/2014   MCV 91.2  06/04/2014   PLT 197 06/04/2014    Lipase: No results found for this basename: LIPASE    Lactic Acid/Procalcitonin:  Recent Labs Lab 06/04/14 1501  LATICACIDVEN 1.83    Cardiac Enzymes: Lab Results  Component Value  Date   CKTOTAL 199* 10/22/2009   CKMB 6.8* 10/22/2009   TROPONINI <0.30 06/04/2014   BNP: No results found for this basename: PROBNP,  in the last 72 hours  D-Dimer:  Recent Labs  06/04/14 1312  DDIMER 1.22*    CBG: No results found for this basename: GLUCAP,  in the last 72 hours  Hemoglobin A1C: No results found for this basename: HGBA1C,  in the last 72 hours  Lipid Panel: No results found for this basename: CHOL, HDL, LDLCALC, TRIG, CHOLHDL, LDLDIRECT,  in the last 72 hours  Thyroid Function Tests: No results found for this basename: TSH, T4TOTAL, FREET4, T3FREE, THYROIDAB,  in the last 72 hours  Anemia Panel: No results found for this basename: VITAMINB12, FOLATE, FERRITIN, TIBC, IRON, RETICCTPCT,  in the last 72 hours  Coagulation:  Recent Labs  06/04/14 1819  LABPROT 14.2  INR 1.10    Urine Drug Screen: Drugs of Abuse:     Component Value Date/Time   LABOPIA NONE DETECTED 06/04/2014 1445   COCAINSCRNUR NONE DETECTED 06/04/2014 1445   LABBENZ NONE DETECTED 06/04/2014 1445   AMPHETMU NONE DETECTED 06/04/2014 1445   THCU POSITIVE* 06/04/2014 1445   LABBARB NONE DETECTED 06/04/2014 1445    Alcohol Level:  Recent Labs  06/04/14 1445  ETH <11    Urinalysis:    Component Value Date/Time   COLORURINE YELLOW 06/04/2014 1445   APPEARANCEUR CLOUDY* 06/04/2014 1445   LABSPEC 1.011 06/04/2014 1445   PHURINE 5.5 06/04/2014 1445   GLUCOSEU NEGATIVE 06/04/2014 1445   HGBUR NEGATIVE 06/04/2014 1445   BILIRUBINUR NEGATIVE 06/04/2014 1445   [place] 06/04/2014 1445   PROTEINUR NEGATIVE 06/04/2014 1445   UROBILINOGEN 1.0 06/04/2014 1445   NITRITE NEGATIVE 06/04/2014 1445   LEUKOCYTESUR TRACE* 06/04/2014 1445    Imaging results:  Ct  Angio Chest W/cm &/or Wo Cm  06/04/2014   CLINICAL DATA:  Shortness of breath, chest pain, oxygen desaturation, history of asthma.  EXAM: CT ANGIOGRAPHY CHEST WITH CONTRAST  TECHNIQUE: Multidetector CT imaging of the chest was performed using the standard protocol during bolus administration of intravenous contrast. Multiplanar CT image reconstructions and MIPs were obtained to evaluate the vascular anatomy.  CONTRAST:  62m OMNIPAQUE IOHEXOL 350 MG/ML SOLN  COMPARISON:  Chest radiograph June 04, 2014  FINDINGS: Adequate contrast opacification of the pulmonary artery's. Main pulmonary artery is not enlarged. No pulmonary arterial filling defects to the level of the subsegmental branches.  Heart is unremarkable, no right heart strain. Pericardial wall thickening anteriorly without effusion. Thoracic aorta is normal course and caliber, unremarkable, mild calcific atherosclerosis with 2 vessel arch, a normal variant. No lymphadenopathy by CT size criteria. Tracheobronchial tree is patent, no pneumothorax. Moderate to severe centrilobular emphysema with apical bullous changes. Increased lung volumes. 3 mm right lower lobe sub solid pulmonary nodule, below size surveillance recommendations.  Included view of the abdomen is unremarkable. Visualized soft tissues and included osseous structures appear normal.  Review of the MIP images confirms the above findings.  IMPRESSION: No acute pulmonary embolism nor acute cardiopulmonary process.  Moderate to severe emphysema with apical bullous changes.   Electronically Signed   By: CElon Alas  On: 06/04/2014 16:56   Dg Chest Portable 1 View  06/04/2014   CLINICAL DATA:  SHORTNESS OF BREATH  EXAM: PORTABLE CHEST - 1 VIEW  COMPARISON:  Two view chest dated 10/21/2009  FINDINGS: The heart size and mediastinal contours are within normal limits. Both lungs are clear. The  visualized skeletal structures are unremarkable.  IMPRESSION: No active disease.   Electronically Signed    By: Margaree Mackintosh M.D.   On: 06/04/2014 13:27    EKG: EKG Interpretation  Date/Time:  Friday June 04 2014 13:08:39 EDT Ventricular Rate:  92 PR Interval:  141 QRS Duration: 136 QT Interval:  451 QTC Calculation: 558 R Axis:   -4 Text Interpretation:  Sinus rhythm Atrial premature complex LVH with secondary repolarization abnormality Anterior Q waves, possibly due to LVH Minimal ST elevation, lateral leads Prolonged QT interval Artifact in lead(s) V6 Artifact Confirmed by Wyvonnia Dusky  MD, STEPHEN [phone]) on 06/04/2014 1:30:10 PM   Antibiotics: Antibiotics Given (last 72 hours)   None      Anti-infectives   None      SIRS/Sepsis/Septic Shock criteria met:  No  Consults:    Assessment & Plan by Problem: Principal Problem:   Shortness of breath Active Problems:   Anxiety disorder   Asthma in adult   Chronic bullous emphysema   Increased anion gap metabolic acidosis   Elevated liver enzymes   Hyponatremia   Hypertension   Hypoxia   Chronic allergic rhinitis   Panic attacks   Acute respiratory distress Pt reports SOB this AM with a h/o similar episodes associated with panic attacks.  When we examined her O2 sats were 100% on RA.  pCXR was c/w no active disease, PNA, pneumothorax, pulmonary edema/effusion.  ABG reveals respiratory alkalosis.  Wells score: 1.5, so risk of PE is low probability.  D-dimer elevated, CTA chest completed and revealed no acute PE nor acute cardiopulmonary process.  Moderate to severe emphysema with apical bullous changes.  Pt reports using her rescue inhaler only infrequently.  Likely panic attack as pt reports the episode lasted only 10 minutes and has completely resolved in the ED with SpO2 100% on RA.   -albuterol q6h PRN -supplemental O2 PRN  -provide smoking cessation education -continuous pulse oximetry -ABG if decompensates  Intermittent asthma Stable.  -per above  Hypertension  Stable, on zestoretic at home. -hold home  meds -should d/c HCTZ d/t hyponatremia/hypokalemia   Elevated transaminases  Possibilities include NAFLD, alcoholic hepatitis, viral hepatitis but would expect ALT>AST.  Normal synthetic function (albumin, PT/INR).   -trend LFTs, check GGT -hepatitis panel   Normocytic anemia  H/H stable; baseline hgb ~9.  Pt has no symptoms/signs of bleeding. INR 1.10.   -monitor cbc  Substance abuse  Pt has a h/o tobacco and THC use  -smoking cessation counseling  FEN  Fluids- NS 161m/h Electrolytes-  Hypokalemia - check Mg; Replete as needed  Hyponatremia - likely related to HCTZ use which should be discontinued, will check TSH, adrenal insufficiency? Nutrition- Regular  Screening  -check HIV  VTE prophylaxis  lovenox 432mSQ qd  Disposition Disposition deferred at this time, awaiting improvement of current medical problems. Anticipated discharge in approximately 1-2 day(s).    Emergency Contact Contact Information   Name Relation Home Work Mobile   Conner,Otis Father [phone]    The patient does have a current PCP (No Pcp Per Patient) and [facility].  Signed JaJones BalesMD PGY-1, Internal Medicine Teaching Service [phone]-5PM Mon-Fri) 06/04/2014, 10:28 PM

## 2014-06-04 NOTE — ED Provider Notes (Signed)
Medical screening examination/treatment/procedure(s) were conducted as a shared visit with non-physician practitioner(s) and myself.  I personally evaluated the patient during the encounter.  Acute onset SOB after looking at garden. Felt SOB and panicky.  No chest pain.  O2 saturation 85% for EMS.  Decreased breath sounds throughout.  Hypotensive on arrival. US without evidence of R heart strain.  R/o PE.   Elevated LFTs likely from alcohol abuse, hyponatremia 125.   EKG Interpretation   Date/Time:  Friday June 04 2014 13:08:39 EDT Ventricular Rate:  92 PR Interval:  141 QRS Duration: 136 QT Interval:  451 QTC Calculation: 558 R Axis:   -4 Text Interpretation:  Sinus rhythm Atrial premature complex LVH with  secondary repolarization abnormality Anterior Q waves, possibly due to LVH  Minimal ST elevation, lateral leads Prolonged QT interval Artifact in  lead(s) V6 Artifact Confirmed by Manus GunningANCOUR  MD, STEPHEN [phone]([id number]) on 06/04/2014  1:30:10 PM       Glynn OctaveStephen Rancour, MD 06/04/14 [id number]

## 2014-06-04 NOTE — ED Notes (Addendum)
Per PT[facility] - pt coming from home. Pt was outside, then came inside ate some chicken, then started to feel like she couldn't breath. Upon ems arrival O2 sats were 85%, pt was hyperventilating, pt was placed on 15 liters/,min NRB. Lung sounds clear. Able to get pt to slow her breathing down, O2 sats raised to 92%. Then upon arrival pt O2 sats started to decrease again. Has inhaler at home, but didn't use it. Hx of panic attacks and HTN. BP 104/70 HR 110. RR 20.

## 2014-06-04 NOTE — Progress Notes (Signed)
[name] [id number] Admission Data: 06/04/2014 7:29 PM Attending Provider: Farley LyJerry Dale Joines, MD  PCP:No PCP Per Patient Consults/ Treatment Team:    [name] is a 43 y.o. female patient admitted from ED awake, alert  & orientated  X 3,  Full Code, VSS - Blood pressure 94/61, pulse 75, temperature 97.9 F (36.6 C), temperature source Oral, resp. rate 20, height 5\' 4"  (1.626 m), weight 45.587 kg (100 lb 8 oz), SpO2 100.00%., no c/o shortness of breath, no c/o chest pain, no distress noted. Tele # 11 placed.  Allergies:  No Known Allergies   Past Medical History  Diagnosis Date  . Asthma   . Hypertension   . Mitral valve prolapse      Pt orientation to unit, room and routine. Information packet given to patient/family.  Admission INP armband ID verified with patient/family, and in place. SR up x 2, fall risk assessment complete with Patient and family verbalizing understanding of risks associated with falls. Pt verbalizes an understanding of how to use the call bell and to call for help before getting out of bed.  Skin, clean-dry- intact without evidence of bruising, or skin tears.   No evidence of skin break down noted on exam.     Will cont to monitor and assist as needed.  [name], [name] L, RN 06/04/2014 7:29 PM

## 2014-06-04 NOTE — ED Notes (Signed)
PA at the bedside discussing lab results.

## 2014-06-04 NOTE — ED Provider Notes (Signed)
CSN: [id number]     Arrival date & time 06/04/14  1247 History   First MD Initiated Contact with Patient 06/04/14 1255     Chief Complaint  Patient presents with  . Shortness of Breath     (Consider location/radiation/quality/duration/timing/severity/associated sxs/prior Treatment) HPI Comments: Patient is a 43 year old female past medical history significant for asthma, hypertension, panic attacks presented to the emergency department via EMS for shortness of breath and respiratory distress. Patient states earlier today she was outside developed wheezing and asthma attack-like symptoms and began to feel panicked and started to hyperventilate. When EMS arrived the patient had oxygen saturations of 85% placed on a nonrebreather which improved oxygen saturations. Patient is only endorsing anxiety and shortness of breath. She states she has been feeling well until this occurred. He endorses rare alcohol use, denies any intravenous drugs or recreational drug use aside from marijuana. Patient is a level V caveat due to acuity of condition.    Past Medical History  Diagnosis Date  . Asthma   . Hypertension   . Mitral valve prolapse    History reviewed. No pertinent past surgical history. No family history on file. History  Substance Use Topics  . Smoking status: Current Every Day Smoker -- 0.25 packs/day    Types: Cigarettes  . Smokeless tobacco: Not on file  . Alcohol Use: Yes     Comment: occassionally   OB History   Grav Para Term Preterm Abortions TAB SAB Ect Mult Living                 Review of Systems  Unable to perform ROS: Acuity of condition      Allergies  Review of patient's allergies indicates no known allergies.  Home Medications   Prior to Admission medications   Medication Sig Start Date End Date Taking? Authorizing Provider  albuterol (PROVENTIL HFA;VENTOLIN HFA) 108 (90 BASE) MCG/ACT inhaler Inhale 2 puffs into the lungs every 6 (six) hours as needed for  wheezing or shortness of breath.   Yes Historical Provider, MD  Brompheniramine-Phenylephrine (COLD & ALLERGY PO) Take 2 tablets by mouth every 4 (four) hours as needed. For sinus   Yes Historical Provider, MD  lisinopril-hydrochlorothiazide (PRINZIDE,ZESTORETIC) 20-12.5 MG per tablet Take 1 tablet by mouth daily.   Yes Historical Provider, MD  mometasone (NASONEX) 50 MCG/ACT nasal spray Place 2 sprays into the nose daily as needed. allergies   Yes Historical Provider, MD   BP 93/55  Pulse 89  Temp(Src) 97.7 F (36.5 C) (Oral)  Resp 22  SpO2 100% Physical Exam  Constitutional: She appears well-developed and well-nourished. She is cooperative.  Non-toxic appearance. She appears ill. She appears distressed. Nasal cannula in place.  HENT:  Head: Normocephalic and atraumatic.  Right Ear: External ear normal.  Left Ear: External ear normal.  Nose: Nose normal.  Eyes: Conjunctivae are normal.  Neck: Neck supple.  Cardiovascular: Regular rhythm, normal heart sounds and intact distal pulses.  Tachycardia present.   Pulmonary/Chest: Accessory muscle usage present. Tachypnea noted. She is in respiratory distress. She has decreased breath sounds in the right lower field and the left lower field. She exhibits no tenderness.   Unable to talk in complete sentences  Abdominal: Soft. There is no tenderness.  Musculoskeletal: Normal range of motion. She exhibits no edema.  Generalized body shaking  Neurological: She is alert. GCS eye subscore is 4. GCS verbal subscore is 5. GCS motor subscore is 6.  Skin: Skin is warm and dry.  ED Course  Procedures (including critical care time) Medications  sodium chloride 0.9 % bolus 1,000 mL (1,000 mLs Intravenous New Bag/Given 06/04/14 1443)  LORazepam (ATIVAN) injection 1 mg (1 mg Intravenous Given 06/04/14 1329)  ipratropium-albuterol (DUONEB) 0.5-2.5 (3) MG/3ML nebulizer solution 3 mL (3 mLs Nebulization Given 06/04/14 1330)  iohexol (OMNIPAQUE) 350 MG/ML  injection 80 mL (80 mLs Intravenous Contrast Given 06/04/14 1630)    Labs Review Labs Reviewed  COMPREHENSIVE METABOLIC PANEL - Abnormal; Notable for the following:    Sodium 125 (*)    Potassium 3.5 (*)    Chloride 81 (*)    Glucose, Bld 125 (*)    AST 153 (*)    ALT 123 (*)    Alkaline Phosphatase 215 (*)    Total Bilirubin 1.4 (*)    GFR calc non Af Amer 66 (*)    GFR calc Af Amer 77 (*)    All other components within normal limits  CBC WITH DIFFERENTIAL - Abnormal; Notable for the following:    RBC 3.73 (*)    Hemoglobin 11.3 (*)    HCT 34.0 (*)    All other components within normal limits  D-DIMER, QUANTITATIVE - Abnormal; Notable for the following:    D-Dimer, Quant 1.22 (*)    All other components within normal limits  URINALYSIS, ROUTINE W REFLEX MICROSCOPIC - Abnormal; Notable for the following:    APPearance CLOUDY (*)    Leukocytes, UA TRACE (*)    All other components within normal limits  URINE RAPID DRUG SCREEN (HOSP PERFORMED) - Abnormal; Notable for the following:    Tetrahydrocannabinol POSITIVE (*)    All other components within normal limits  MAGNESIUM - Abnormal; Notable for the following:    Magnesium 1.4 (*)    All other components within normal limits  URINE MICROSCOPIC-ADD ON - Abnormal; Notable for the following:    Squamous Epithelial / LPF MANY (*)    All other components within normal limits  I-STAT ARTERIAL BLOOD GAS, ED - Abnormal; Notable for the following:    pH, Arterial 7.458 (*)    pCO2 arterial 34.3 (*)    pO2, Arterial 139.0 (*)    Bicarbonate 24.3 (*)    All other components within normal limits  TROPONIN I  ETHANOL  HEPATITIS PANEL, ACUTE  OSMOLALITY  OSMOLALITY, URINE  POC URINE PREG, ED  I-STAT CG4 LACTIC ACID, ED    Imaging Review Dg Chest Portable 1 View  06/04/2014   CLINICAL DATA:  SHORTNESS OF BREATH  EXAM: PORTABLE CHEST - 1 VIEW  COMPARISON:  Two view chest dated 10/21/2009  FINDINGS: The heart size and mediastinal  contours are within normal limits. Both lungs are clear. The visualized skeletal structures are unremarkable.  IMPRESSION: No active disease.   Electronically Signed   By: Salome HolmesHector  Cooper M.D.   On: 06/04/2014 13:27     EKG Interpretation   Date/Time:  Friday June 04 2014 13:08:39 EDT Ventricular Rate:  92 PR Interval:  141 QRS Duration: 136 QT Interval:  451 QTC Calculation: 558 R Axis:   -4 Text Interpretation:  Sinus rhythm Atrial premature complex LVH with  secondary repolarization abnormality Anterior Q waves, possibly due to LVH  Minimal ST elevation, lateral leads Prolonged QT interval Artifact in  lead(s) V6 Artifact Confirmed by Manus GunningANCOUR  MD, STEPHEN [phone]([id number]) on 06/04/2014  1:30:10 PM      CRITICAL CARE Performed by: Francee PiccoloPIEPENBRINK, Jamason Peckham L   Total critical care time: 30 minutes  Critical  care time was exclusive of separately billable procedures and treating other patients.  Critical care was necessary to treat or prevent imminent or life-threatening deterioration.  Critical care was time spent personally by me on the following activities: development of treatment plan with patient and/or surrogate as well as nursing, discussions with consultants, evaluation of patient's response to treatment, examination of patient, obtaining history from patient or surrogate, ordering and performing treatments and interventions, ordering and review of laboratory studies, ordering and review of radiographic studies, pulse oximetry and re-evaluation of patient's condition.  MDM   Final diagnoses:  Hyponatremia  Transaminitis  Elevated d-dimer  Shortness of breath    Filed Vitals:   06/04/14 1615  BP: 93/55  Pulse: 89  Temp:   Resp: 22   Patient presented via EMS for shortness of breath and respiratory distress. Patient is hypoxic on room air via EMS prior to arrival.   Patient initially with accessory muscle use and tachycardic, tachypneic very anxious appearing.   Patient  noted to be hyponatremic, hypokalemic, hypochloremic. There is an elevated d-dimer. Transaminitis is noted. Abdominal exam is benign. Will obtain CT chest.   I have reviewed nursing notes, vital signs, and all appropriate lab and imaging results for this patient.  Patient will be admitted to teaching service for further management and evaluation of symptoms. Patient d/w with Dr. Manus Gunningancour, agrees with plan.      Jeannetta EllisJennifer L Khoen Genet, PA-C 06/04/14 1651

## 2014-06-04 NOTE — Progress Notes (Signed)
Pt BP 86/54. Notified MD on call. MD ordered orthostatic v/s and asked for MAP to be documented.

## 2014-06-04 NOTE — ED Notes (Addendum)
Pt sts that she started to feel sob and then get body cramps. sts she tried using her inhaler and taking her anti-anxiety medicine. sts she felt like her heart was racing and couldn't catch her breath. Pt unable to speak in full sentences, stopping to take a breath in between words.

## 2014-06-04 NOTE — ED Notes (Signed)
Admitting MD at bedside.

## 2014-06-04 NOTE — ED Notes (Signed)
Pt taken off NRM and placed on 3 liters/min O2 [place]. maintaining good O2 sats.

## 2014-06-05 DIAGNOSIS — J441 Chronic obstructive pulmonary disease with (acute) exacerbation: Secondary | ICD-10-CM | POA: Diagnosis present

## 2014-06-05 DIAGNOSIS — E878 Other disorders of electrolyte and fluid balance, not elsewhere classified: Secondary | ICD-10-CM

## 2014-06-05 LAB — COMPREHENSIVE METABOLIC PANEL
ALT: 91 U/L — AB (ref 0–35)
AST: 106 U/L — ABNORMAL HIGH (ref 0–37)
Albumin: 2.8 g/dL — ABNORMAL LOW (ref 3.5–5.2)
Alkaline Phosphatase: 171 U/L — ABNORMAL HIGH (ref 39–117)
BILIRUBIN TOTAL: 0.7 mg/dL (ref 0.3–1.2)
BUN: 7 mg/dL (ref 6–23)
CHLORIDE: 97 meq/L (ref 96–112)
CO2: 23 meq/L (ref 19–32)
Calcium: 8.3 mg/dL — ABNORMAL LOW (ref 8.4–10.5)
Creatinine, Ser: 0.75 mg/dL (ref 0.50–1.10)
GFR calc Af Amer: >90 mL/min (ref >90)
GLUCOSE: 73 mg/dL (ref 70–99)
POTASSIUM: 3.5 meq/L — AB (ref 3.7–5.3)
SODIUM: 135 meq/L — AB (ref 137–147)
Total Protein: 6.1 g/dL (ref 6.0–8.3)

## 2014-06-05 LAB — MAGNESIUM: MAGNESIUM: 1.9 mg/dL (ref 1.5–2.5)

## 2014-06-05 LAB — HIV ANTIBODY (ROUTINE TESTING W REFLEX): HIV 1&2 Ab, 4th Generation: NONREACTIVE

## 2014-06-05 LAB — URIC ACID: Uric Acid, Serum: 4.8 mg/dL (ref 2.4–7.0)

## 2014-06-05 LAB — HEPATITIS PANEL, ACUTE
HCV AB: NEGATIVE
Hep A IgM: NONREACTIVE
Hep B C IgM: NONREACTIVE
Hepatitis B Surface Ag: NEGATIVE

## 2014-06-05 MED ORDER — IPRATROPIUM-ALBUTEROL 18-103 MCG/ACT IN AERO: 1.0000 | INHALATION_SPRAY | RESPIRATORY_TRACT | Status: DC | PRN

## 2014-06-05 MED ORDER — POTASSIUM CHLORIDE CRYS ER 20 MEQ PO TBCR
40.0000 meq | EXTENDED_RELEASE_TABLET | Freq: Once | ORAL | Status: AC
  Administered 2014-06-05: 40 meq via ORAL
  Filled 2014-06-05: qty 2

## 2014-06-05 MED ORDER — PNEUMOCOCCAL VAC POLYVALENT 25 MCG/0.5ML IJ INJ
0.5000 mL | INJECTION | INTRAMUSCULAR | Status: AC
  Administered 2014-06-05: 0.5 mL via INTRAMUSCULAR
  Filled 2014-06-05: qty 0.5

## 2014-06-05 MED ORDER — SODIUM CHLORIDE 0.9 % IV BOLUS (SEPSIS)
1000.0000 mL | Freq: Once | INTRAVENOUS | Status: AC
  Administered 2014-06-05: 1000 mL via INTRAVENOUS

## 2014-06-05 NOTE — Progress Notes (Signed)
Subjective:   Day of hospitalization: 1  VSS.  No overnight events.  Pt is feeling great this AM.  No SOB, CP, cough.  Has not required albuterol overnight.    Objective:   Vital signs in last 24 hours: Filed Vitals:   06/04/14 1844 06/04/14 2015 06/04/14 2126 06/05/14 0633  BP: 94/61 86/54 80/42  91/59  Pulse: 75 87  68  Temp: 97.9 F (36.6 C) 98.4 F (36.9 C)  97.6 F (36.4 C)  TempSrc: Oral Oral  Oral  Resp: 20 20  18   Height: 5\' 4"  (1.626 m)     Weight: 100 lb 8 oz (45.587 kg)     SpO2: 100% 99%  100%    Weight: Filed Weights   06/04/14 1844  Weight: 100 lb 8 oz (45.587 kg)    I/Os: No intake or output data in the 24 hours ending 06/05/14 1318  Physical Exam: Constitutional: Vital signs reviewed.  Patient is sitting up in bed in no acute distress and cooperative with exam.   HEENT: [place]/AT; 1cm nodule on R temple area, EOMI, conjunctivae pale, no scleral icterus  Cardiovascular: RRR, no MRG Pulmonary/Chest: normal respiratory effort, no accessory muscle use, CTAB, no wheezes, rales, or rhonchi Abdominal: Soft. +BS, NT/ND Neurological: A&O x3, CN II-XII grossly intact; non-focal exam Extremities: 2+DP b/l, no C/C/E  Skin: Warm, dry and intact. No rash  Lab Results:  BMP:  Recent Labs Lab 06/04/14 1312 06/04/14 1445 06/04/14 1819 06/05/14 0452  NA 125*  --   --  135*  K 3.5*  --   --  3.5*  CL 81*  --   --  97  CO2 21  --   --  23  GLUCOSE 125*  --   --  73  BUN 6  --   --  7  CREATININE 1.03  --   --  0.75  CALCIUM 9.3  --   --  8.3*  MG  --  1.4*  --  1.9  PHOS  --   --  3.9  --    Anion Gap:  15  CBC:  Recent Labs Lab 06/04/14 1312  WBC 5.8  NEUTROABS 2.8  HGB 11.3*  HCT 34.0*  MCV 91.2  PLT 197    Coagulation:  Recent Labs Lab 06/04/14 1819  LABPROT 14.2  INR 1.10   LFTs:  Recent Labs Lab 06/04/14 1312 06/05/14 0452  AST 153* 106*  ALT 123* 91*  ALKPHOS 215* 171*  BILITOT 1.4* 0.7  PROT 7.1 6.1  ALBUMIN 3.5  2.8*    Pancreatic Enzymes: No results found for this basename: LIPASE, AMYLASE,  in the last 168 hours  Lactic Acid/Procalcitonin:  Recent Labs Lab 06/04/14 1501  LATICACIDVEN 1.83   Cardiac Enzymes:  Recent Labs Lab 06/04/14 1312  TROPONINI <0.30    EKG: EKG Interpretation  Date/Time:  Friday June 04 2014 13:08:39 EDT Ventricular Rate:  92 PR Interval:  141 QRS Duration: 136 QT Interval:  451 QTC Calculation: 558 R Axis:   -4 Text Interpretation:  Sinus rhythm Atrial premature complex LVH with secondary repolarization abnormality Anterior Q waves, possibly due to LVH Minimal ST elevation, lateral leads Prolonged QT interval Artifact in lead(s) V6 Artifact Confirmed by Manus GunningANCOUR  MD, STEPHEN ([id number]) on 06/04/2014 1:30:10 PM  D-Dimer:  Recent Labs Lab 06/04/14 1312  DDIMER 1.22*    Urinalysis:  Recent Labs Lab 06/04/14 1445  COLORURINE YELLOW  LABSPEC 1.011  PHURINE 5.5  GLUCOSEU  NEGATIVE  HGBUR NEGATIVE  BILIRUBINUR NEGATIVE  KETONESUR NEGATIVE  PROTEINUR NEGATIVE  UROBILINOGEN 1.0  NITRITE NEGATIVE  LEUKOCYTESUR TRACE*   Studies/Results: Ct Angio Chest W/cm &/or Wo Cm  06/04/2014   CLINICAL DATA:  Shortness of breath, chest pain, oxygen desaturation, history of asthma.  EXAM: CT ANGIOGRAPHY CHEST WITH CONTRAST  TECHNIQUE: Multidetector CT imaging of the chest was performed using the standard protocol during bolus administration of intravenous contrast. Multiplanar CT image reconstructions and MIPs were obtained to evaluate the vascular anatomy.  CONTRAST:  80mL OMNIPAQUE IOHEXOL 350 MG/ML SOLN  COMPARISON:  Chest radiograph June 04, 2014  FINDINGS: Adequate contrast opacification of the pulmonary artery's. Main pulmonary artery is not enlarged. No pulmonary arterial filling defects to the level of the subsegmental branches.  Heart is unremarkable, no right heart strain. Pericardial wall thickening anteriorly without effusion. Thoracic aorta is normal course  and caliber, unremarkable, mild calcific atherosclerosis with 2 vessel arch, a normal variant. No lymphadenopathy by CT size criteria. Tracheobronchial tree is patent, no pneumothorax. Moderate to severe centrilobular emphysema with apical bullous changes. Increased lung volumes. 3 mm right lower lobe sub solid pulmonary nodule, below size surveillance recommendations.  Included view of the abdomen is unremarkable. Visualized soft tissues and included osseous structures appear normal.  Review of the MIP images confirms the above findings.  IMPRESSION: No acute pulmonary embolism nor acute cardiopulmonary process.  Moderate to severe emphysema with apical bullous changes.   Electronically Signed   By: Awilda Metro   On: 06/04/2014 16:56   Dg Chest Portable 1 View  06/04/2014   CLINICAL DATA:  SHORTNESS OF BREATH  EXAM: PORTABLE CHEST - 1 VIEW  COMPARISON:  Two view chest dated 10/21/2009  FINDINGS: The heart size and mediastinal contours are within normal limits. Both lungs are clear. The visualized skeletal structures are unremarkable.  IMPRESSION: No active disease.   Electronically Signed   By: Salome Holmes M.D.   On: 06/04/2014 13:27    Medications:  Scheduled Meds: . enoxaparin (LOVENOX) injection  40 mg Subcutaneous Q24H  . sodium chloride  3 mL Intravenous Q12H   Continuous Infusions:  PRN Meds: albuterol  Antibiotics: Antibiotics Given (last 72 hours)   None      Day of Hospitalization: 1  Consults:    Assessment/Plan:   Principal Problem:   Shortness of breath Active Problems:   Anxiety disorder   Asthma in adult   Chronic bullous emphysema   Increased anion gap metabolic acidosis   Elevated liver enzymes   Hyponatremia   Hypertension   Hypoxia   Chronic allergic rhinitis   Panic attacks  Acute respiratory distress  Pt reports no SOB overnight.  O2sats 100% on RA.  O2 sats dropped to 92% on RA while ambulating.  Did not require albuterol overnight.  Stable to  d/c home today with close follow-up in [facility].  Counseled on smoking cessation.   -d/c on combivent -follow-up in [facility] -PFTs as an outpatient    Intermittent asthma  Stable.  -per above   Hypertension  Stable, on zestoretic at home.  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  AST>ALT, GGT elevated.  Also admits to drinking more since her mother passed away in 12/28/22.  Normal synthetic function (albumin, PT/INR).  -counseled on alcohol cessation  Normocytic anemia  H/H stable; baseline hgb ~9. Pt has no symptoms/signs of bleeding. INR 1.10.  -monitor  cbc   Polysubstance abuse  Pt has a h/o tobacco, THC, and alcohol abuse  -smoking, THC, and alcohol cessation counseling -[facility] to help cessation    FEN  Fluids- NS 13300ml/h  Electrolytes-  Hypokalemia - Replete as needed  Hypomagnesemia - Repleted with magox Hyponatremia - likely related to HCTZ Nutrition- Regular   Disposition Disposition is deferred, awaiting improvement of current medical problems.  Anticipated discharge in approximately 1-2 day(s).     LOS: 1 day   Marrian SalvageJacquelyn S Gill, MD PGY-1, Internal Medicine Teaching Service [phone] (7AM-5PM Mon-Fri) 06/05/2014, 1:18 PM

## 2014-06-05 NOTE — Discharge Summary (Signed)
Name: [name] MRN: [id number] DOB: 02-01-1971 43 y.o. PCP: No Pcp Per Patient _________________________________________________  Date of Admission: 06/04/2014 12:49 PM Date of Discharge: 06/05/2014 Attending Physician: Farley LyJerry Dale Joines, MD  Discharge Diagnosis: Acute respiratory distress Intermittent asthma History of hypertension  Elevated transaminases Electrolyte abnormalities Normocytic anemia Polysubstance abuse    Discharge Medications:   Medication List    STOP taking these medications       albuterol 108 (90 BASE) MCG/ACT inhaler  Commonly known as:  PROVENTIL HFA;VENTOLIN HFA     COLD & ALLERGY PO     lisinopril-hydrochlorothiazide 20-12.5 MG per tablet  Commonly known as:  PRINZIDE,ZESTORETIC      TAKE these medications       albuterol-ipratropium 18-103 MCG/ACT inhaler  Commonly known as:  COMBIVENT  Inhale 1-2 puffs into the lungs every 4 (four) hours as needed for wheezing or shortness of breath.     mometasone 50 MCG/ACT nasal spray  Commonly known as:  NASONEX  Place 2 sprays into the nose daily as needed. allergies        Disposition and follow-up:   [name] [facility] in [facility] to home.  Please address the following problems post-discharge:  1.Resolution of shortness of breath, check SpO2 with ambulation, any further panic attacks-schedule PFTs 2.Blood pressure, meds discontinued upon admission d/t soft BP, doubt these need to be resumed 3.Elevated transaminases, likely secondary to alcohol abuse 4.Polysubstance abuse counseling-alcohol, tobacco, THC 5.Needs anemia panel 6.Electrolyte abnormalities-hypokalemia, hyponatremia resolution 7.Health maintenance-pap smear, mammogram, appropriate vaccines   Labs / imaging needed at time of follow-up: CMP, cbc, anemia panel, SpO2 with ambulation, PFTs  Pending labs/ test needing follow-up: None  Follow-up Appointments: The  internal medicine clinic will call you to schedule a hospital follow-up appointment next week.     Discharge Instructions: Discharge Instructions   Call MD for:  difficulty breathing, headache or visual disturbances    Complete by:  As directed      Call MD for:  extreme fatigue    Complete by:  As directed      Call MD for:  persistant dizziness or light-headedness    Complete by:  As directed      Diet - low sodium heart healthy    Complete by:  As directed      Increase activity slowly    Complete by:  As directed            Consultations:  None  Procedures Performed:  Ct Angio Chest W/cm &/or Wo Cm  06/04/2014   CLINICAL DATA:  Shortness of breath, chest pain, oxygen desaturation, history of asthma.  EXAM: CT ANGIOGRAPHY CHEST WITH CONTRAST  TECHNIQUE: Multidetector CT imaging of the chest was performed using the standard protocol during bolus administration of intravenous contrast. Multiplanar CT image reconstructions and MIPs were obtained to evaluate the vascular anatomy.  CONTRAST:  80mL OMNIPAQUE IOHEXOL 350 MG/ML SOLN  COMPARISON:  Chest radiograph June 04, 2014  FINDINGS: Adequate contrast opacification of the pulmonary artery's. Main pulmonary artery is not enlarged. No pulmonary arterial filling defects to the level of the subsegmental branches.  Heart is unremarkable, no right heart strain. Pericardial wall thickening anteriorly without effusion. Thoracic aorta is normal course and caliber, unremarkable, mild calcific atherosclerosis with 2 vessel arch, a normal variant. No lymphadenopathy by CT size criteria. Tracheobronchial tree is patent, no pneumothorax. Moderate to severe centrilobular emphysema with apical bullous changes. Increased lung  volumes. 3 mm right lower lobe sub solid pulmonary nodule, below size surveillance recommendations.  Included view of the abdomen is unremarkable. Visualized soft tissues and included osseous structures appear normal.  Review of the MIP  images confirms the above findings.  IMPRESSION: No acute pulmonary embolism nor acute cardiopulmonary process.  Moderate to severe emphysema with apical bullous changes.   Electronically Signed   By: Awilda Metro   On: 06/04/2014 16:56   Dg Chest Portable 1 View  06/04/2014   CLINICAL DATA:  SHORTNESS OF BREATH  EXAM: PORTABLE CHEST - 1 VIEW  COMPARISON:  Two view chest dated 10/21/2009  FINDINGS: The heart size and mediastinal contours are within normal limits. Both lungs are clear. The visualized skeletal structures are unremarkable.  IMPRESSION: No active disease.   Electronically Signed   By: Salome Holmes M.D.   On: 06/04/2014 13:27    2D Echo: N/A  Cardiac Cath: N/A  Admission HPI:  [name] is a 43 y.o. female who has a past medical history of intermittent asthma, anxiety/panic attacks, HTN, and MVP who presents to ED with dyspnea. She went outside this AM to look at her garden and came back inside and began feeling SOB. She had the air conditioning on and felt that her symptoms may have been related to being exposed to the cold air. The episode lasted about 10 minutes and was somewhat relieved with her albuterol inhaler which she has not used in 4 months. She also endorses mild wheezing, HA, generalized weakness (her legs and arms felt "heavy"), slight lightheadedness, mild dizziness, and seeing spots. She denies any chest tightness, cough, diaphoresis, fever/chills, N/V/D/C, abdominal pain, urinary symptoms, or changes in bowel habits. No recent illness or stressors. She reports decreased appetite since her mom passed away and has been drinking ensure to try to increase her caloric intake. She smokes 1pack cig/2 wks, drinks alcohol occasionally, but denies recent THC use. She does have a h/o panic attacks and reports this felt similar to her panic attacks in the past.  According to EMS, her SpO2 was in the 80s? In the ED, she was given ativan, duonebs, and 1L NS bolus. She is  saturating 100% on RA.   Hospital Course by problem list: Principal Problem:   COPD exacerbation Active Problems:   Anxiety disorder   Asthma in adult   Chronic bullous emphysema   Increased anion gap metabolic acidosis   Elevated liver enzymes   Hyponatremia   Hypertension   Shortness of breath   Hypoxia   Chronic allergic rhinitis   Panic attacks   Acute respiratory distress  Pt p/w SOB this AM with a h/o similar episodes associated with panic attacks. When we examined her O2 sats were 100% on RA. pCXR was c/w no active disease, PNA, pneumothorax, pulmonary edema/effusion. ABG reveals respiratory alkalosis.  According to ED notes, O2 sat was 85% and pt placed on NR with improvement of O2 sat.  Wells score: 1.5, so risk of PE is low probability. D-dimer elevated, CTA chest completed and revealed no acute PE nor acute cardiopulmonary process. Moderate to severe emphysema with apical bullous changes. Pt reports using her rescue inhaler only infrequently (once in 4 months).   Likely panic attack as pt reports the episode lasted only 10 minutes and has completely resolved in the ED with SpO2 100% on RA.  She was kept overnight and provided duonebs PRN but did not require any additional treatments or oxygen supplementation.  O2  sats at rest were 100% on RA and dropped to 92% on RA while ambulating.  However, given significant lung disease on imaging, she was discharged with combivent and provided smoking cessation counseling.  She does not recall having PFTs but was diagnosed with asthma in her teens.  At hospital follow-up appointment, she will need to have PFTs arranged.       Intermittent asthma  Stable.  CXR shows no active disease.  CTA chest shows no acute pulmonary embolism nor acute cardiopulmonary process. Moderate to severe emphysema with apical bullous changes.  Plans per above.    Hypertension  Stable, on zestoretic at home.  Held home meds during hospitalization due to soft BP.  We  will discontinue these upon discharge as pt is normotensive (97/66) after a 1L NS bolus.  She reports that her BP runs low (in the 90s systolic) and according to chart review this is accurate.    Elevated transaminases  Likely due to alcoholic hepatitis, AST>ALT.  GGT elevated.  Viral hepatitis panel neg.  Normal synthetic function (albumin, PT/INR). Pt admits to drinking more since her mother passed away in January.  She was counseled extensively on alcohol cessation.   Normocytic anemia  H/H stable; baseline hgb ~9. Pt has no symptoms/signs of bleeding. INR 1.10.  This should be worked up further on hospital follow-up.    Substance abuse  Pt has a h/o tobacco, alcohol, and THC use.  UDS positive for THC.  Counseled extensively on cigarette/THC and alcohol abuse.  Mg, phos normal.  [facility] and should offer aides that may help her quit.   Electrolyte abnormalities Pt initially hypokalemic and hyponatremic.  Potassium repleted and provided NS with improvement.  Likely related to HCTZ use which was discontinued upon discharge.     Discharge Vitals:   BP 88/59  Pulse 70  Temp(Src) 98.7 F (37.1 C) (Oral)  Resp 18  Ht 5\' 4"  (1.626 m)  Wt 100 lb 8 oz (45.587 kg)  BMI 17.24 kg/m2  SpO2 100%  Discharge Labs:  Results for orders placed during the hospital encounter of 06/04/14 (from the past 24 hour(s))  I-STAT CG4 LACTIC ACID, ED     Status: None   Collection Time    06/04/14  3:01 PM      Result Value Ref Range   Lactic Acid, Venous 1.83  0.5 - 2.2 mmol/L  I-STAT ARTERIAL BLOOD GAS, ED     Status: Abnormal   Collection Time    06/04/14  3:20 PM      Result Value Ref Range   pH, Arterial 7.458 (*) 7.350 - 7.450   pCO2 arterial 34.3 (*) 35.0 - 45.0 mmHg   pO2, Arterial 139.0 (*) 80.0 - 100.0 mmHg   Bicarbonate 24.3 (*) 20.0 - 24.0 mEq/L   TCO2 25  0 - 100 mmol/L   O2 Saturation 99.0     Acid-Base Excess 1.0  0.0 - 2.0 mmol/L   Collection site RADIAL, ALLEN'S TEST  ACCEPTABLE     Drawn by RT     Sample type ARTERIAL    PHOSPHORUS     Status: None   Collection Time    06/04/14  6:19 PM      Result Value Ref Range   Phosphorus 3.9  2.3 - 4.6 mg/dL  PROTIME-INR     Status: None   Collection Time    06/04/14  6:19 PM      Result Value Ref Range  Prothrombin Time 14.2  11.6 - 15.2 seconds   INR 1.10  0.00 - 1.49  APTT     Status: None   Collection Time    06/04/14  6:19 PM      Result Value Ref Range   aPTT 26  24 - 37 seconds  TSH     Status: None   Collection Time    06/04/14  9:20 PM      Result Value Ref Range   TSH 0.507  0.350 - 4.500 uIU/mL  HIV ANTIBODY (ROUTINE TESTING)     Status: None   Collection Time    06/04/14  9:20 PM      Result Value Ref Range   HIV 1&2 Ab, 4th Generation NONREACTIVE  NONREACTIVE  GAMMA GT     Status: Abnormal   Collection Time    06/04/14 11:04 PM      Result Value Ref Range   GGT 472 (*) 7 - 51 U/L  COMPREHENSIVE METABOLIC PANEL     Status: Abnormal   Collection Time    06/05/14  4:52 AM      Result Value Ref Range   Sodium 135 (*) 137 - 147 mEq/L   Potassium 3.5 (*) 3.7 - 5.3 mEq/L   Chloride 97  96 - 112 mEq/L   CO2 23  19 - 32 mEq/L   Glucose, Bld 73  70 - 99 mg/dL   BUN 7  6 - 23 mg/dL   Creatinine, Ser 1.61  0.50 - 1.10 mg/dL   Calcium 8.3 (*) 8.4 - 10.5 mg/dL   Total Protein 6.1  6.0 - 8.3 g/dL   Albumin 2.8 (*) 3.5 - 5.2 g/dL   AST 096 (*) 0 - 37 U/L   ALT 91 (*) 0 - 35 U/L   Alkaline Phosphatase 171 (*) 39 - 117 U/L   Total Bilirubin 0.7  0.3 - 1.2 mg/dL   GFR calc non Af Amer >90  >90 mL/min   GFR calc Af Amer >90  >90 mL/min  MAGNESIUM     Status: None   Collection Time    06/05/14  4:52 AM      Result Value Ref Range   Magnesium 1.9  1.5 - 2.5 mg/dL  URIC ACID     Status: None   Collection Time    06/05/14  4:52 AM      Result Value Ref Range   Uric Acid, Serum 4.8  2.4 - 7.0 mg/dL    Signed: Marrian Salvage, MD 06/07/2014, 10:26 AM   Services Ordered on Discharge:  None Equipment Ordered on Discharge: None

## 2014-06-05 NOTE — H&P (Signed)
Internal Medicine Attending Admission Note Date: 06/05/2014  Patient name: Laurie ChimeRhonda E [name] Medical record number: [id number] Date of birth: 1970/12/23 Age: 43 y.o. Gender: female  I saw and evaluated the patient. I reviewed the resident's note and I agree with the resident's findings and plan as documented in the resident's note, with the following additional comments.  Chief Complaint(s): Generalized weakness, shortness of breath  History - key components related to admission: Patient is a 43 year old woman with history of asthma, anxiety/panic attacks, hypertension, and mitral valve prolapse brought to the emergency department by EMS with complaint of shortness of breath.  Patient reports walking in her garden without problems, then went back inside and became generally very weak; she reports associated shortness of breath.  EMS documented hypoxia, and patient was hypotensive on arrival to the ED.  She was treated with IV normal saline, a breathing treatment, and a dose of Ativan.  Patient reports resolution of her symptoms in the emergency department, and currently is feeling fine without any shortness of breath or feeling of weakness.  She had no chest pain with this episode.  She reports having been out of bed and ambulating this morning without problems.  She reports smoking about one pack of cigarettes every 2 weeks; she reports some alcohol consumption and prior occasional marijuana consumption.     Physical Exam - key components related to admission:  Filed Vitals:   06/04/14 1844 06/04/14 2015 06/04/14 2126 06/05/14 0633  BP: 94/61 86/54 80/42  91/59  Pulse: 75 87  68  Temp: 97.9 F (36.6 C) 98.4 F (36.9 C)  97.6 F (36.4 C)  TempSrc: Oral Oral  Oral  Resp: 20 20  18   Height: 5\' 4"  (1.626 m)     Weight: 100 lb 8 oz (45.587 kg)     SpO2: 100% 99%  100%   General: Alert, no distress Lungs: Clear Heart: Regular; S1-S2, no S3, no S4, no murmurs Abdomen: Bowel sounds present,  soft, nontender Extremities: No edema; no calf tenderness  Lab results:   Basic Metabolic Panel:  Recent Labs  74/25/9506/26/15 1312 06/04/14 1445 06/04/14 1819 06/05/14 0452  NA 125*  --   --  135*  K 3.5*  --   --  3.5*  CL 81*  --   --  97  CO2 21  --   --  23  GLUCOSE 125*  --   --  73  BUN 6  --   --  7  CREATININE 1.03  --   --  0.75  CALCIUM 9.3  --   --  8.3*  MG  --  1.4*  --  1.9  PHOS  --   --  3.9  --     Liver Function Tests:  Recent Labs  06/04/14 1312 06/05/14 0452  AST 153* 106*  ALT 123* 91*  ALKPHOS 215* 171*  BILITOT 1.4* 0.7  PROT 7.1 6.1  ALBUMIN 3.5 2.8*    CBC:  Recent Labs  06/04/14 1312  WBC 5.8  HGB 11.3*  HCT 34.0*  MCV 91.2  PLT 197    Recent Labs  06/04/14 1312  NEUTROABS 2.8  LYMPHSABS 2.5  MONOABS 0.6  EOSABS 0.0  BASOSABS 0.0    Cardiac Enzymes:  Recent Labs  06/04/14 1312  TROPONINI <0.30     D-Dimer:  Recent Labs  06/04/14 1312  DDIMER 1.22*     Thyroid Function Tests:  Recent Labs  06/04/14 2120  TSH 0.507  Coagulation:  Recent Labs  06/04/14 1819  INR 1.10    Urine Drug Screen: Drugs of Abuse     Component Value Date/Time   LABOPIA NONE DETECTED 06/04/2014 1445   COCAINSCRNUR NONE DETECTED 06/04/2014 1445   LABBENZ NONE DETECTED 06/04/2014 1445   AMPHETMU NONE DETECTED 06/04/2014 1445   THCU POSITIVE* 06/04/2014 1445   LABBARB NONE DETECTED 06/04/2014 1445     Alcohol Level:  Recent Labs  06/04/14 1445  ETH <11    Urinalysis    Component Value Date/Time   COLORURINE YELLOW 06/04/2014 1445   APPEARANCEUR CLOUDY* 06/04/2014 1445   LABSPEC 1.011 06/04/2014 1445   PHURINE 5.5 06/04/2014 1445   GLUCOSEU NEGATIVE 06/04/2014 1445   HGBUR NEGATIVE 06/04/2014 1445   BILIRUBINUR NEGATIVE 06/04/2014 1445   KETONESUR NEGATIVE 06/04/2014 1445   PROTEINUR NEGATIVE 06/04/2014 1445   UROBILINOGEN 1.0 06/04/2014 1445   NITRITE NEGATIVE 06/04/2014 1445   LEUKOCYTESUR TRACE* 06/04/2014 1445     Urine microscopic:  Recent Labs  06/04/14 1445  EPIU MANY*  WBCU 0-2  RBCU 0-2  BACTERIA RARE      Imaging results:  Ct Angio Chest W/cm &/or Wo Cm  06/04/2014   CLINICAL DATA:  Shortness of breath, chest pain, oxygen desaturation, history of asthma.  EXAM: CT ANGIOGRAPHY CHEST WITH CONTRAST  TECHNIQUE: Multidetector CT imaging of the chest was performed using the standard protocol during bolus administration of intravenous contrast. Multiplanar CT image reconstructions and MIPs were obtained to evaluate the vascular anatomy.  CONTRAST:  80mL OMNIPAQUE IOHEXOL 350 MG/ML SOLN  COMPARISON:  Chest radiograph June 04, 2014  FINDINGS: Adequate contrast opacification of the pulmonary artery's. Main pulmonary artery is not enlarged. No pulmonary arterial filling defects to the level of the subsegmental branches.  Heart is unremarkable, no right heart strain. Pericardial wall thickening anteriorly without effusion. Thoracic aorta is normal course and caliber, unremarkable, mild calcific atherosclerosis with 2 vessel arch, a normal variant. No lymphadenopathy by CT size criteria. Tracheobronchial tree is patent, no pneumothorax. Moderate to severe centrilobular emphysema with apical bullous changes. Increased lung volumes. 3 mm right lower lobe sub solid pulmonary nodule, below size surveillance recommendations.  Included view of the abdomen is unremarkable. Visualized soft tissues and included osseous structures appear normal.  Review of the MIP images confirms the above findings.  IMPRESSION: No acute pulmonary embolism nor acute cardiopulmonary process.  Moderate to severe emphysema with apical bullous changes.   Electronically Signed   By: Awilda Metroourtnay  Bloomer   On: 06/04/2014 16:56   Dg Chest Portable 1 View  06/04/2014   CLINICAL DATA:  SHORTNESS OF BREATH  EXAM: PORTABLE CHEST - 1 VIEW  COMPARISON:  Two view chest dated 10/21/2009  FINDINGS: The heart size and mediastinal contours are within  normal limits. Both lungs are clear. The visualized skeletal structures are unremarkable.  IMPRESSION: No active disease.   Electronically Signed   By: Salome HolmesHector  Cooper M.D.   On: 06/04/2014 13:27    Other results: EKG: Sinus rhythm; LVH with repolarization abnormality; anterior Q waves, prolonged QT interval  Assessment & Plan by Problem:  1.  Acute respiratory distress, likely due to COPD.  Patient presented with acute respiratory distress which resolved following inhaled bronchodilators  and IV fluid given in the emergency department.  Her chest CT scan shows moderate to severe emphysema with apical bullous changes.  Patient carries a diagnosis of asthma, and has been on albuterol inhaler at home as needed.  She is much  improved today, with no shortness of breath.  Her O2 saturations have been good on room air.  The plan is to check oxygen saturation with ambulation; start Combivent metered-dose inhaler; we strongly advised complete smoking cessation and avoidance of marijuana as well; patient will need outpatient pulmonary function studies.  2.  Hypotension likely due to volume depletion and antihypertensive medication effect.  Patient is on a combination ACE inhibitor/diuretic for high blood pressure; her hypotension responded to normal saline volume replacement and she is currently asymptomatic.  The plan is to check orthostatics this a.m.; hold her antihypertensive medication pending outpatient clinic followup.    3.  Elevated transaminases and alkaline phosphatase.  Hepatitis serology was negative; the elevated liver enzymes are likely due to alcohol, and are somewhat better today.  We strongly advised patient to avoid alcohol completely; would repeat a liver panel on followup in the outpatient clinic.  If the elevation is persistent or worsening, then further evaluation of her liver is advisable.  4.  Anticipate possible discharge home later today if patient does well, with follow-up in the  outpatient clinic next week.

## 2014-06-05 NOTE — Progress Notes (Signed)
SATURATION QUALIFICATIONS:   Patient Saturations on Room Air at Rest = 100%  Patient Saturations on Room Air while Ambulating = 92%

## 2014-06-05 NOTE — Discharge Instructions (Signed)
Please keep your follow-up appointments; this is very important for your continued recovery.  The internal medicine clinic will call you to schedule an appointment for next week.    We have made the following additions/changes to your medications:  Please do not take your blood pressure medications.    Please continue to take all of your medications as prescribed.  Do not miss any doses without contacting your primary physician.  If you have questions, please contact your physician or contact the Internal Medicine Teaching Service at [phone].  Please bring your medicications with you to your appointments; medications may be eye drops, herbals, vitamins, or pills.    If you believe you are suffering from a life-threatening emergency, go to the nearest Emergency Department.      [employer] Guide  1) Find a Scientist, water quality Although you won't have to find out who is covered by [employer] plan, it is a good idea to ask around and get recommendations. You will then need to call the office and see if the doctor you have chosen will accept you as a new patient and what types of options they offer for patients who are self-pay. Some doctors offer discounts or will set up payment plans for their patients who do not have insurance, but you will need to ask so you aren't surprised when you get to your appointment.  2) Contact Your Local Health Department Not all health departments have doctors that can see patients for sick visits, but many do, so it is worth a call to see if yours does. If you don't know where your local health department is, you can check in your phone book. The CDC also has a tool to help you locate your state's health department, and many state websites also have listings of all of their local health departments.  3) Find a Walk-in Clinic If your illness is not likely to be very severe or complicated, you may want to try a walk in clinic. These are popping  up all over the country in pharmacies, drugstores, and shopping centers. They're usually staffed by nurse practitioners or physician assistants that have been trained to treat common illnesses and complaints. They're usually fairly quick and inexpensive. However, if you have serious medical issues or chronic medical problems, these are probably not your best option.  No Primary Care Doctor: - Call Health Connect at  [phone] - they can help you locate a primary care doctor that  accepts your insurance, provides certain services, etc. - Physician Referral Service- [phone]  Chronic Pain Problems: Organization         Address  Phone   Notes  [facility]  [phone] Patients need to be referred by their primary care doctor.   Medication Assistance: Organization         Address  Phone   Notes  [facility] Medication [facility] [address] [place] [place], [place] [address] [phone] --Must be a resident of [facility] -- Must have NO insurance coverage whatsoever (no Medicaid/ Medicare, etc.) -- The pt. MUST have a primary care doctor that directs their care regularly and follows them in the community   MedAssist  [phone]   [employer]  [phone]    Agencies that provide inexpensive medical care: Organization         Address  Phone   Notes  [facility]  [phone]  [facility]    [phone]) [phone]   [facility] [address] [place], [place] [address] [phone]) [phone]   [facility] [place] [address], [place] [phone]) [phone]   Planned Parenthood    [phone]) [phone]   [facility]    [phone]) [phone]   [facility]  [address], [place] Phone:  [phone](336) [phone], Fax:  [phone] Hours of Operation:  9 am - 6 pm, M-F.  Also accepts Medicaid/Medicare and self-pay.  [facility]  [address], [place] Phone: [phone]) [phone], Fax: [phone](336) [phone]. Hours of Operation:  8:30 am - 5:30 pm, M-F.  Also accepts Medicaid and self-pay.  [facility] [place] [address], [place] Phone: [phone](336) [phone]   [facility] [address], [place], [place] [phone], Ext. [phone] Mondays & Thursdays: 7-9 AM.  First 15 patients are seen on a first come, first serve basis.    Medicaid-accepting [facility] Providers:  Organization         Address  Phone   Notes  [facility] [address], [place] [phone]) [phone] Also accepts self-pay patients.  [facility] [address], [place]  [phone]) [phone]   [facility] [address], [place] [phone]) [phone]   [facility] [address] [place] [address], [place] [phone]) [phone]   Renaye RakersVeita Bland [address], [place]   [phone] Only accepts [place] Access [place] patients after they have their name applied to their card.   Self-Pay (no insurance) in [facility]:  Organization         Address  Phone   Notes  Sickle Cell Patients, [facility] [address], [place] [phone]) [phone]   [facility] [address], [place] [phone]) [phone]   [facility] [place]  [address], [place] [phone]) [phone]   [facility]/Dr. Osei-Bonsu  [address] [place] [address], [place] or [id number], [place] [phone]) [phone] Phone number for both [place] and [place] locations is the same.  [facility] [address], [place] [phone]) [phone]   [facility] [place] [address] [place] [address], [place] or [address] [phone]) [phone]) [phone]   [facility] [address], [place] [phone]) [phone], phone; [phone](336) [phone], fax Sees patients 1st and 3rd Saturday of  every month.  Must not qualify for public or private insurance (i.e. Medicaid, Medicare, [place] Health Choice, Veterans' Benefits)  Household income should be no more than 200% of the poverty level The clinic cannot treat you if you are pregnant or think you are pregnant  Sexually transmitted diseases are not treated at the clinic.    Dental Care: Organization         Address  Phone  Notes  [facility] Department of [facility] [address], [place] [phone] Accepts children up to age 42 who are enrolled in [place]icaid or [place] Health Choice; pregnant women with a Medicaid card; and children who have applied for Medicaid or [place] Health Choice, but were declined, whose parents can pay a reduced fee at time of service.  [facility] Department of [facility] [place]  [address],  [place] [phone] Accepts children up to age 22 who are enrolled in Medicaid or [place] Health Choice; pregnant women with a Medicaid card; and children who have applied for Medicaid or [place] Health Choice, but were declined, whose parents can pay a reduced fee at time of service.  [facility]  [address] [place], [place] [phone] Patients are seen by appointment only. Walk-ins are not accepted. [facility] will see patients 58 years of age and older. Monday - Tuesday (8am-5pm) Most Wednesdays (8:30-5pm) $30 per visit, cash only  [facility]  [address], [facility] [phone] Patients are seen by appointment only. Walk-ins are not accepted. [facility] will see patients 45 years of age and older. One Wednesday Evening (Monthly: Volunteer Based).  $30 per visit, cash only  [employer]  [phone] for adults; Children under age 75, call Graduate Pediatric Dentistry at [phone]. Children aged 9-14, please call [phone] to request a pediatric application.  Dental  services are provided in all areas of dental care including fillings, crowns and bridges, complete and partial dentures, implants, gum treatment, root canals, and extractions. Preventive care is also provided. Treatment is provided to both adults and children. Patients are selected via a lottery and there is often a waiting list.   [facility] [address], [place]  [phone] [email or web address]   [facility] [address] [place], [place] [phone], Ext. [phone] Second and Fourth Thursday of each month, opens at 6:30 AM; Clinic ends at 9 AM.  Patients are seen on a first-come first-served basis, and a limited number are seen during each clinic.   [facility]  [address] [place] [place], [place] [phone]   Eligibility Requirements You must have lived in [place], [place], or [place] counties for at least the last three months.   You cannot be eligible for state or federal sponsored [place], including [employer], [place], or [employer].   You generally cannot be eligible for healthcare insurance through your employer.    How to apply: Eligibility screenings are held every Tuesday and Wednesday afternoon from 1:00 pm until 4:00 pm. You do not need an appointment for the interview!  [facility] [address], [place], [place] [phone]   [facility] Health Department  [phone]   [facility] Health Department  [phone]   [facility] Health Department  [phone]    Behavioral Health Resources in the Community: Intensive Outpatient Programs Organization         Address  Phone  Notes  [facility] [address] [place], [place] [phone]   [facility] Outpatient [address], [place], [place] [phone]   [facility]: [facility] Drug S[facility] [address], [place], [place]  [phone]   [facility] Mental Health [address]    [place], [place] [phone] or [phone]   Substance Abuse Resources Organization         Address  Phone  Notes  Alcohol [facility] Drug Services  [phone]   [facility]  [phone]   [facility]  [phone]   Floydene Flock  [phone]   Residential & Outpatient Substance Abuse Program  [phone]   Psychological Services Organization         Address  Phone  Notes  [facility] Behavioral Health  [phone]   [facility]  [phone]- [phone]   [facility] [address], [place] [phone] or [phone]    Mobile Crisis Teams Organization         Address  Phone  Notes  [facility], Mobile Crisis Care Unit  [phone]   [facility]  [address] [place], [place] [phone]   Doristine LocksSharon DeEsch [address] [place] [phone]    Self-Help/Support Groups Organization         Address  Phone             Notes  [facility] [place] - variety of support groups  336- [phone] Call for more information  Narcotics Anonymous (NA), [facility] [address], [place]  2 meetings at this location   Statisticianesidential Treatment Programs Organization         Address  Phone  Notes  [facility] [address],    [place]  [phone]   [facility]  [address], [place] [phone], [place], [place] [phone]   [facility] [address], [place] [phone] Admissions: 8am-3pm M-F  [facility] [address]    [place], [place] [phone]   [facility] [address], [place], [place] [phone]   [facility] [address]  [place], [place] [phone]   [facility] - Intensive Outpatient [address], [place], [place] [phone]   [facility] ([facility] [address]  [place], [place]  [phone] or [phone]   [facility] (RT[facility]) [address] [place], [place] [phone] Accepts Medicaid  [facility] [address]  [place] [phone] Substance Abuse/Addiction Treatment   [facility] Behavioral Health Resources Organization         Address  Phone  Notes  [facility]  [phone]) [phone]   Angie FavaJulie Brannon, PhD [address], [address] [place], [place]   [phone]) [phone] or [phone]) [phone]   [facility] [place]   [address] [place], [place] [phone](336) [phone]   [facility] [address], [place], [place] [phone](336) [phone] Insurance/Medicaid/sponsorship through [facility]  [facility] [address]                                    [place], [place] [phone](336) [phone] Therapy/tele-psych/case  [facility] [address]   [place], [place] [phone](336) [phone]    Dr. Lolly MustacheArfeen  [phone]) [phone]   [facility] [place]  [employer] [facility] 1) [address], [place] 2) [address], [place] 3)  [address], [place] [phone](336) [phone]  [phone](336) [phone]   [facility] Child Abuse Hotline [phone](336) [phone] or [phone]) [phone] (After Hours)

## 2014-06-06 NOTE — Progress Notes (Signed)
Utilization Review Completed.Dowell, Deborah T6/28/2015  

## 2014-06-08 NOTE — Discharge Summary (Signed)
Internal Medicine Attending Addendum Although anxiety may have contributed to her symptoms, given patient's hypoxia on presentation and CT scan finding of moderate to severe emphysema with apical bullous changes, a component of her respiratory distress likely was due to  her COPD.  She will need pulmonary function testing on follow up, and smoking cessation was strongly advised.

## 2014-06-14 ENCOUNTER — Encounter: Payer: Self-pay | Admitting: General Practice

## 2014-06-30 ENCOUNTER — Ambulatory Visit (INDEPENDENT_AMBULATORY_CARE_PROVIDER_SITE_OTHER): Payer: Medicaid Other | Admitting: Internal Medicine

## 2014-06-30 VITALS — BP 162/98 | HR 77 | Temp 98.6°F | Wt 103.7 lb

## 2014-06-30 DIAGNOSIS — F419 Anxiety disorder, unspecified: Secondary | ICD-10-CM

## 2014-06-30 DIAGNOSIS — I10 Essential (primary) hypertension: Secondary | ICD-10-CM

## 2014-06-30 DIAGNOSIS — E876 Hypokalemia: Secondary | ICD-10-CM

## 2014-06-30 DIAGNOSIS — R748 Abnormal levels of other serum enzymes: Secondary | ICD-10-CM

## 2014-06-30 DIAGNOSIS — J45909 Unspecified asthma, uncomplicated: Secondary | ICD-10-CM

## 2014-06-30 DIAGNOSIS — D649 Anemia, unspecified: Secondary | ICD-10-CM

## 2014-06-30 DIAGNOSIS — F411 Generalized anxiety disorder: Secondary | ICD-10-CM

## 2014-06-30 DIAGNOSIS — J441 Chronic obstructive pulmonary disease with (acute) exacerbation: Secondary | ICD-10-CM

## 2014-06-30 LAB — ANEMIA PANEL
%SAT: 48 % (ref 20–55)
ABS Retic: 51 10*3/uL (ref 19.0–186.0)
FERRITIN: 374 ng/mL — AB (ref 10–291)
FOLATE: 15.1 ng/mL
Iron: 138 ug/dL (ref 42–145)
RBC.: 3.92 MIL/uL (ref 3.87–5.11)
Retic Ct Pct: 1.3 % (ref 0.4–2.3)
TIBC: 287 ug/dL (ref 250–470)
UIBC: 149 ug/dL (ref 125–400)
Vitamin B-12: 817 pg/mL (ref 211–911)

## 2014-06-30 LAB — CBC
HCT: 35.7 % — ABNORMAL LOW (ref 36.0–46.0)
Hemoglobin: 11.9 g/dL — ABNORMAL LOW (ref 12.0–15.0)
MCH: 30.4 pg (ref 26.0–34.0)
MCHC: 33.3 g/dL (ref 30.0–36.0)
MCV: 91.1 fL (ref 78.0–100.0)
Platelets: 370 10*3/uL (ref 150–400)
RBC: 3.92 MIL/uL (ref 3.87–5.11)
RDW: 15.3 % (ref 11.5–15.5)
WBC: 5.8 10*3/uL (ref 4.0–10.5)

## 2014-06-30 LAB — COMPLETE METABOLIC PANEL WITH GFR
ALBUMIN: 4.3 g/dL (ref 3.5–5.2)
ALT: 31 U/L (ref 0–35)
AST: 52 U/L — AB (ref 0–37)
Alkaline Phosphatase: 125 U/L — ABNORMAL HIGH (ref 39–117)
BUN: 12 mg/dL (ref 6–23)
CALCIUM: 9.3 mg/dL (ref 8.4–10.5)
CO2: 26 mEq/L (ref 19–32)
Chloride: 98 mEq/L (ref 96–112)
Creat: 0.68 mg/dL (ref 0.50–1.10)
GFR, Est African American: >89 mL/min
GFR, Est Non African American: >89 mL/min
Glucose, Bld: 136 mg/dL — ABNORMAL HIGH (ref 70–99)
POTASSIUM: 4.2 meq/L (ref 3.5–5.3)
Sodium: 133 mEq/L — ABNORMAL LOW (ref 135–145)
Total Bilirubin: 0.4 mg/dL (ref 0.2–1.2)
Total Protein: 7.7 g/dL (ref 6.0–8.3)

## 2014-06-30 MED ORDER — LISINOPRIL 40 MG PO TABS: 40.0000 mg | ORAL_TABLET | Freq: Every day | ORAL | Status: DC

## 2014-06-30 NOTE — Patient Instructions (Addendum)
1. I will call you if there are problems with your labs.  Please start taking Lisinopril 40mg  (1 pill) every day for your blood pressure.     2. Please take all medications as prescribed.    3. If you have worsening of your symptoms or new symptoms arise, please call the clinic [phone]), or go to the ER immediately if symptoms are severe.  4. We will call you with your appointment date for lung function test.  Please come back in 1 month to check you blood pressure.

## 2014-06-30 NOTE — Progress Notes (Signed)
   Subjective:    [name], female    DOB: Nov 29, 1971, 43 y.o.   MRN: [id number]  HPI Comments: [name] is a 43 year old woman with a PMH of asthma (dx at age 43 but no constant inhaler use), anxiety and HTN.  She presents for hospital follow-up after admission for acute respiratory distress thought to be secondary to panic attack.  Her asthma appeared stable, however CT finding suggest emphysema and she has + smoking hx.  She says she feels fine since discharge.  Still with occasional dyspnea responding to Combivent, uses it once per day, especially if she goes outside or in hot environment.  She has been smoking since she was 19.  Smokes 1 PPD q2 weeks.  She is contemplating quitting.  She says she has had 2 panic attacks since discharge, dyspnea is the main symptoms, no chest pain or tachycardia, she is able to calm down by going to a quiet environment, using her inhaler.  Vistaril [facility] helps a little.  She takes 1 in the AM and 1 in the PM.  [facility].  Going to counseling session.  She feels this is helpful.  Her mother recently passed.  Has family support.   Asthma She complains of shortness of breath and wheezing. There is no cough. Pertinent negatives include no appetite change, chest pain, fever or headaches. Her past medical history is significant for asthma.      Review of Systems  Constitutional: Negative for fever, chills and appetite change.  Eyes: Negative for visual disturbance.  Respiratory: Positive for shortness of breath and wheezing. Negative for cough and chest tightness.   Cardiovascular: Negative for chest pain and leg swelling.  Gastrointestinal: Negative for nausea, vomiting, abdominal pain, diarrhea, constipation and blood in stool.  Genitourinary: Negative for dysuria, frequency and vaginal bleeding.  Neurological: Positive for light-headedness. Negative for syncope, weakness and headaches.       Lightheaded  when she gets dyspneic.  Psychiatric/Behavioral: The patient is nervous/anxious.        Objective:   Physical Exam  Vitals reviewed. Constitutional: She is oriented to person, place, and time. No distress.  Thin build  HENT:  Head: Normocephalic and atraumatic.  Mouth/Throat: Oropharynx is clear and moist. No oropharyngeal exudate.  Sinuses non-tender  Eyes: EOM are normal. Pupils are equal, round, and reactive to light.  Cardiovascular: Normal rate, regular rhythm, normal heart sounds and intact distal pulses.  Exam reveals no gallop and no friction rub.   No murmur heard. Pulmonary/Chest: Effort normal and breath sounds normal. No respiratory distress. She has no wheezes. She has no rales.  Abdominal: Soft. Bowel sounds are normal. She exhibits no distension. There is no tenderness.  Musculoskeletal: Normal range of motion. She exhibits no edema and no tenderness.  Neurological: She is alert and oriented to person, place, and time. No cranial nerve deficit.  Skin: Skin is warm. She is not diaphoretic.  Psychiatric: She has a normal mood and affect. Her behavior is normal.          Assessment & Plan:  Please see problem based assessment and plan.

## 2014-07-01 ENCOUNTER — Encounter: Payer: Self-pay | Admitting: Internal Medicine

## 2014-07-01 NOTE — Assessment & Plan Note (Addendum)
No wheezing on exam.  SpO2 on RA is 100% at rest and 97% with ambulation.  She has not had to increase inhaler use.  She does not remember having PFTs done in the past.  Given smoking hx and emphysematous changes on imaging will send for PFTs. - continue prn inhaler - refer for PFTs - follow-up in 1 month

## 2014-07-01 NOTE — Assessment & Plan Note (Signed)
Thought to be 2/2 to increased EtOH use since her mother passed.  Hepatitis panel negative.   - repeat CMP

## 2014-07-01 NOTE — Assessment & Plan Note (Signed)
Stable.  She follows with [facility] and finds therapy helpful.  She is compliant with Remeron, trazodone and prn Vistaril, all [facility].  - continue following with [facility] - continue current medications

## 2014-07-01 NOTE — Assessment & Plan Note (Signed)
BP Readings from Last 3 Encounters:  06/30/14 162/98  06/05/14 97/66    Lab Results  Component Value Date   NA 133* 06/30/2014   K 4.2 06/30/2014   CREATININE 0.68 06/30/2014    Assessment: Blood pressure control:  poor control Progress toward BP goal:   not at goal Comments: lisinopril-HCTZ was discontinued during admission due to normotension and hypokalemia.  She is hypertensive at today's visit and says her BP was also high at Psychiatrist office the other day.   Plan: Medications:  Start lisinopril 40mg  daily.  No HCTZ given hypokalemia. Other plans: check CMP; RTC in 1 month for follow-up of BP

## 2014-07-02 NOTE — Progress Notes (Signed)
Attending physician note: Presenting problems, physical findings, medications, reviewed with resident physician Dr. Evelena PeatAlex Wilson and I concur with her assessment and management plan. Cephas DarbyJames Gibran Veselka, M.D., FACP

## 2014-07-12 ENCOUNTER — Encounter (HOSPITAL_COMMUNITY): Payer: Medicaid Other

## 2014-07-20 ENCOUNTER — Encounter (HOSPITAL_COMMUNITY): Payer: Medicaid Other

## 2014-07-29 ENCOUNTER — Ambulatory Visit (HOSPITAL_COMMUNITY)
Admission: RE | Admit: 2014-07-29 | Discharge: 2014-07-29 | Disposition: A | Discharge status: Home / Self Care | Payer: Medicaid Other | Source: Ambulatory Visit | Attending: Internal Medicine | Admitting: Internal Medicine

## 2014-07-29 DIAGNOSIS — J441 Chronic obstructive pulmonary disease with (acute) exacerbation: Secondary | ICD-10-CM | POA: Diagnosis present

## 2014-07-29 LAB — PULMONARY FUNCTION TEST
DL/VA % PRED: 63 %
DL/VA: 3.14 ml/min/mmHg/L
DLCO COR % PRED: 57 %
DLCO cor: 14.84 ml/min/mmHg
DLCO unc % pred: 57 %
DLCO unc: 14.84 ml/min/mmHg
FEF 25-75 Post: 2.68 L/sec
FEF 25-75 Pre: 2.76 L/sec
FEF2575-%CHANGE-POST: -3 %
FEF2575-%PRED-POST: 94 %
FEF2575-%Pred-Pre: 97 %
FEV1-%Change-Post: -2 %
FEV1-%PRED-POST: 105 %
FEV1-%PRED-PRE: 108 %
FEV1-POST: 2.72 L
FEV1-PRE: 2.79 L
FEV1FVC-%Change-Post: -1 %
FEV1FVC-%PRED-PRE: 96 %
FEV6-%Change-Post: -1 %
FEV6-%Pred-Post: 110 %
FEV6-%Pred-Pre: 112 %
FEV6-POST: 3.41 L
FEV6-Pre: 3.47 L
FEV6FVC-%Pred-Post: 102 %
FEV6FVC-%Pred-Pre: 102 %
FVC-%CHANGE-POST: -1 %
FVC-%PRED-PRE: 109 %
FVC-%Pred-Post: 108 %
FVC-Post: 3.42 L
FVC-Pre: 3.47 L
POST FEV1/FVC RATIO: 79 %
PRE FEV1/FVC RATIO: 80 %
PRE FEV6/FVC RATIO: 100 %
Post FEV6/FVC ratio: 100 %
RV % pred: 123 %
RV: 2.07 L
TLC % pred: 101 %
TLC: 5.28 L

## 2014-07-29 MED ORDER — ALBUTEROL SULFATE (2.5 MG/3ML) 0.083% IN NEBU
2.5000 mg | INHALATION_SOLUTION | Freq: Once | RESPIRATORY_TRACT | Status: AC
  Administered 2014-07-29: 2.5 mg via RESPIRATORY_TRACT

## 2014-08-19 ENCOUNTER — Encounter: Payer: Self-pay | Admitting: Internal Medicine

## 2014-08-19 ENCOUNTER — Ambulatory Visit (INDEPENDENT_AMBULATORY_CARE_PROVIDER_SITE_OTHER): Payer: Medicaid Other | Admitting: Internal Medicine

## 2014-08-19 VITALS — BP 115/77 | HR 70 | Temp 98.4°F | Ht 64.0 in | Wt 105.9 lb

## 2014-08-19 DIAGNOSIS — F419 Anxiety disorder, unspecified: Secondary | ICD-10-CM

## 2014-08-19 DIAGNOSIS — J45909 Unspecified asthma, uncomplicated: Secondary | ICD-10-CM

## 2014-08-19 DIAGNOSIS — I10 Essential (primary) hypertension: Secondary | ICD-10-CM

## 2014-08-19 DIAGNOSIS — E559 Vitamin D deficiency, unspecified: Secondary | ICD-10-CM

## 2014-08-19 DIAGNOSIS — R748 Abnormal levels of other serum enzymes: Secondary | ICD-10-CM

## 2014-08-19 LAB — COMPLETE METABOLIC PANEL WITH GFR
ALT: 28 U/L (ref 0–35)
AST: 50 U/L — ABNORMAL HIGH (ref 0–37)
Albumin: 4.1 g/dL (ref 3.5–5.2)
Alkaline Phosphatase: 82 U/L (ref 39–117)
BILIRUBIN TOTAL: 0.5 mg/dL (ref 0.2–1.2)
BUN: 6 mg/dL (ref 6–23)
CO2: 25 mEq/L (ref 19–32)
Calcium: 9.2 mg/dL (ref 8.4–10.5)
Chloride: 101 mEq/L (ref 96–112)
Creat: 0.65 mg/dL (ref 0.50–1.10)
GFR, Est African American: >89 mL/min
GFR, Est Non African American: >89 mL/min
Glucose, Bld: 89 mg/dL (ref 70–99)
Potassium: 4 mEq/L (ref 3.5–5.3)
SODIUM: 135 meq/L (ref 135–145)
TOTAL PROTEIN: 7.3 g/dL (ref 6.0–8.3)

## 2014-08-19 MED ORDER — LISINOPRIL 40 MG PO TABS: 40.0000 mg | ORAL_TABLET | Freq: Every day | ORAL | Status: DC

## 2014-08-19 MED ORDER — CETIRIZINE HCL 5 MG PO TABS: 5.0000 mg | ORAL_TABLET | Freq: Every day | ORAL | Status: DC

## 2014-08-19 NOTE — Assessment & Plan Note (Signed)
BP Readings from Last 3 Encounters:  08/19/14 115/77  06/30/14 162/98  06/05/14 97/66    Lab Results  Component Value Date   NA 133* 06/30/2014   K 4.2 06/30/2014   CREATININE 0.68 06/30/2014    Assessment: Blood pressure control:  at goal Progress toward BP goal:   well controlled Comments: compliant with lisinopril  Plan: Medications:  continue current medications:  Lisinopril  daily. Other plans: check CMP today

## 2014-08-19 NOTE — Assessment & Plan Note (Signed)
She admits to increased EtOH in the weeks after her mother's death earlier this year.  She has since cut down and drinks 1-2 beers occasionally and not daily.  She says she realized going to therapy is helpful than the excess drinking.  LFTs were improving at last check.  AST and ALP still slightly elevated. - will repeat CMP today - order right upper quadrant [place]

## 2014-08-19 NOTE — Progress Notes (Signed)
   Subjective:    [name], female    DOB: [birth year], 43 y.o.   MRN: [id number]  HPI Comments: Ms. [name] is a 43 year old woman with a PMH of asthma (dx at age 26 but no constant inhaler use), anxiety and HTN.  She is here for follow-up of her HTN since resuming lisinopril.  Still smoking 3-4 cigarettes per day due to stress.  No increased inhaler use.       Review of Systems  Constitutional: Positive for appetite change. Negative for fever, chills and unexpected weight change.       Appetite up and down.  Respiratory: Negative for shortness of breath.   Cardiovascular: Negative for chest pain and palpitations.  Gastrointestinal: Negative for nausea, vomiting, abdominal pain, diarrhea, constipation and blood in stool.  Genitourinary: Negative for dysuria and hematuria.  Hematological: Bruises/bleeds easily.       Easy bruising ("I've always been like that).  Psychiatric/Behavioral: Negative for suicidal ideas.       Objective:   Physical Exam  Vitals reviewed. Constitutional: She is oriented to person, place, and time. She appears well-developed. No distress.  Eyes: EOM are normal. Pupils are equal, round, and reactive to light.  Cardiovascular: Normal rate, regular rhythm and normal heart sounds.  Exam reveals no gallop and no friction rub.   No murmur heard. Pulmonary/Chest: Effort normal and breath sounds normal. No respiratory distress. She has no wheezes. She has no rales.  Abdominal: Soft. Bowel sounds are normal. She exhibits no distension. There is no tenderness.  Musculoskeletal: Normal range of motion. She exhibits no edema and no tenderness.  Neurological: She is alert and oriented to person, place, and time. No cranial nerve deficit.  Skin: Skin is warm. She is not diaphoretic.  Psychiatric: She has a normal mood and affect. Her behavior is normal.          Assessment & Plan:  Please see problem based assessment and plan.

## 2014-08-19 NOTE — Assessment & Plan Note (Signed)
PFT suggestive of emphysema however pulmonologist reports absence of overinflation is inconsistent with this diagnosis.  She has not had increased dyspnea or increased inhaler use.  She reports usually only needing it when outside and exposed to environmental triggers or walking long distances. - continue albuterol prn - advised smoking cessation - she has picked up smoking cessation brochures from our office and says she will work on it - she is interested in trying gum or patch and I advised her she cannot smoke while using these products - I will continue to address smoking cessation with her

## 2014-08-19 NOTE — Patient Instructions (Signed)
1. We will call you with ultrasound appointment.     2. Please take all medications as prescribed.    3. If you have worsening of your symptoms or new symptoms arise, please call the clinic ([phone]), or go to the ER immediately if symptoms are severe.   Please return to see me in 4-6 months or sooner if you need to.

## 2014-08-19 NOTE — Assessment & Plan Note (Signed)
Stable.  Goes to group therapy weekly at [facility] and sees Psychiatrist every six weeks.  Compliant with meds.  Says Psychiatrist stopped Trazodone.  She is compliant with Remeron and prn Vistaril.  Denies SI/HI.  Has family support (her dad and daughter) and spends time with friends. - continue follow-up with [facility]/Psych - continue current medications

## 2014-08-19 NOTE — Assessment & Plan Note (Signed)
PFT suggestive of emphysema however pulmonologist reports absence of overinflation is inconsistent with this diagnosis.  She has not had increased dyspnea or increased inhaler use.  She reports usually only needing it when outside and exposed to environmental triggers or walking long distances. - continue albuterol prn - advised smoking cessation - she has picked up smoking cessation brochures from our office and says she will work on it - she is interested in trying gum or patch and I advised her she cannot smoke while using these products - I will continue to address smoking cessation with her  

## 2014-08-20 LAB — VITAMIN D 25 HYDROXY (VIT D DEFICIENCY, FRACTURES): Vit D, 25-Hydroxy: 21 ng/mL — ABNORMAL LOW (ref 30–89)

## 2014-09-02 NOTE — Progress Notes (Signed)
Internal Medicine Clinic Attending  Case discussed with Dr Andrey Campanile soon after the visit.  We reviewed the resident's history and exam and pertinent patient test results.  I agree with the assessment, diagnosis, and plan of care documented in the resident's note.

## 2014-10-07 ENCOUNTER — Encounter (HOSPITAL_COMMUNITY): Payer: Self-pay | Admitting: Emergency Medicine

## 2014-10-07 ENCOUNTER — Emergency Department (HOSPITAL_COMMUNITY)
Admission: EM | Admit: 2014-10-07 | Discharge: 2014-10-07 | Disposition: A | Discharge status: Home / Self Care | Payer: Medicaid Other | Attending: Emergency Medicine | Admitting: Emergency Medicine

## 2014-10-07 ENCOUNTER — Emergency Department (HOSPITAL_COMMUNITY): Payer: Medicaid Other

## 2014-10-07 DIAGNOSIS — Y92009 Unspecified place in unspecified non-institutional (private) residence as the place of occurrence of the external cause: Secondary | ICD-10-CM | POA: Diagnosis not present

## 2014-10-07 DIAGNOSIS — I10 Essential (primary) hypertension: Secondary | ICD-10-CM | POA: Diagnosis not present

## 2014-10-07 DIAGNOSIS — S60052A Contusion of left little finger without damage to nail, initial encounter: Secondary | ICD-10-CM | POA: Diagnosis not present

## 2014-10-07 DIAGNOSIS — Z720 Tobacco use: Secondary | ICD-10-CM | POA: Insufficient documentation

## 2014-10-07 DIAGNOSIS — Z7951 Long term (current) use of inhaled steroids: Secondary | ICD-10-CM | POA: Insufficient documentation

## 2014-10-07 DIAGNOSIS — J45909 Unspecified asthma, uncomplicated: Secondary | ICD-10-CM | POA: Insufficient documentation

## 2014-10-07 DIAGNOSIS — S6992XA Unspecified injury of left wrist, hand and finger(s), initial encounter: Secondary | ICD-10-CM

## 2014-10-07 DIAGNOSIS — Z79899 Other long term (current) drug therapy: Secondary | ICD-10-CM | POA: Insufficient documentation

## 2014-10-07 DIAGNOSIS — Y9389 Activity, other specified: Secondary | ICD-10-CM | POA: Insufficient documentation

## 2014-10-07 DIAGNOSIS — W231XXA Caught, crushed, jammed, or pinched between stationary objects, initial encounter: Secondary | ICD-10-CM | POA: Insufficient documentation

## 2014-10-07 MED ORDER — TRAMADOL HCL 50 MG PO TABS
50.0000 mg | ORAL_TABLET | Freq: Four times a day (QID) | ORAL | Status: DC | PRN
Start: 2014-10-07 — End: 2015-03-17

## 2014-10-07 NOTE — ED Provider Notes (Signed)
CSN: [id number]     Arrival date & time 10/07/14  1147 History  This chart was scribed for non-physician practitioner, Arthor CaptainAbigail Mykah Shin, PA-C, working with Flint MelterElliott L Wentz, MD by Charline BillsEssence Howell, ED Scribe. This patient was seen in room TR09C/TR09C and the patient's care was started at 1:12 PM.   Chief Complaint  Patient presents with  . Finger Injury   The history is provided by the patient. No language interpreter was used.   HPI Comments: [name] is a 43 y.o. female who presents to the Emergency Department complaining of L finger injury sustained 3 days ago. Pt states that she jabbed her finger into a brick while carrying her purse into her house. She reports associated pain, swelling and stiffness.   Past Medical History  Diagnosis Date  . Asthma   . Hypertension   . Mitral valve prolapse    History reviewed. No pertinent past surgical history. History reviewed. No pertinent family history. History  Substance Use Topics  . Smoking status: Current Every Day Smoker -- 0.25 packs/day    Types: Cigarettes  . Smokeless tobacco: Not on file     Comment: Smoking less  . Alcohol Use: Yes     Comment: occassionally   OB History   Grav Para Term Preterm Abortions TAB SAB Ect Mult Living                 Review of Systems  Musculoskeletal: Positive for arthralgias and joint swelling.  All other systems reviewed and are negative.  Allergies  Review of patient's allergies indicates no known allergies.  Home Medications   Prior to Admission medications   Medication Sig Start Date End Date Taking? Authorizing Provider  albuterol-ipratropium (COMBIVENT) 18-103 MCG/ACT inhaler Inhale 1-2 puffs into the lungs every 4 (four) hours as needed for wheezing or shortness of breath. 06/05/14  Yes Marrian SalvageJacquelyn S Gill, MD  cetirizine (ZYRTEC) 5 MG tablet Take 1 tablet (5 mg total) by mouth daily. 08/19/14  Yes Yolanda MangesAlex M Wilson, DO  hydrOXYzine (ATARAX/VISTARIL) 25 MG tablet Take 25 mg by mouth  every 8 (eight) hours as needed for anxiety.   Yes Historical Provider, MD  lisinopril (PRINIVIL,ZESTRIL) 40 MG tablet Take 1 tablet (40 mg total) by mouth daily. 08/19/14  Yes Yolanda MangesAlex M Wilson, DO  mirtazapine (REMERON) 45 MG tablet Take 45 mg by mouth at bedtime.   Yes Historical Provider, MD  mometasone (NASONEX) 50 MCG/ACT nasal spray Place 2 sprays into the nose daily as needed (allergies).    Yes Historical Provider, MD   Triage Vitals: BP 169/101  Pulse 72  Temp(Src) 98.1 F (36.7 C) (Oral)  Resp 16  Ht 5\' 4"  (1.626 m)  Wt 112 lb (50.803 kg)  BMI 19.22 kg/m2  SpO2 99% Physical Exam  Nursing note and vitals reviewed. Constitutional: She is oriented to person, place, and time. She appears well-developed and well-nourished. No distress.  HENT:  Head: Normocephalic and atraumatic.  Eyes: Conjunctivae and EOM are normal.  Neck: Neck supple.  Pulmonary/Chest: Effort normal. No respiratory distress.  Musculoskeletal: Normal range of motion.  Neurological: She is alert and oriented to person, place, and time.  Skin: Skin is warm and dry.  L hand: Stiffness, swelling and bruising noted at the PIP of L 5th digit ROM limited Exquisitely tender to palpation at the PIP of the L 5th digit  Psychiatric: She has a normal mood and affect. Her behavior is normal.   ED Course  Procedures (including critical  care time) DIAGNOSTIC STUDIES: Oxygen Saturation is 99% on RA, normal by my interpretation.    COORDINATION OF CARE: 1:14 PM-Discussed treatment plan which includes XR with pt at bedside and pt agreed to plan.   Labs Review Labs Reviewed - No data to display  Imaging Review Dg Hand Complete Left  10/07/2014   CLINICAL DATA:  Hit hand 3 days ago. Hand pain greatest in region of little finger. Initial encounter.  EXAM: LEFT HAND - COMPLETE 3+ VIEW  COMPARISON:  None.  FINDINGS: There is no evidence of fracture or dislocation. There is no evidence of arthropathy or other focal bone  abnormality. Soft tissues are unremarkable.  IMPRESSION: Negative.   Electronically Signed   By: Myles RosenthalJohn  Stahl M.D.   On: 10/07/2014 13:27    EKG Interpretation None      MDM   Final diagnoses:  Finger injury, left, initial encounter    Patient X-Ray negative for obvious fracture or dislocation. Pain managed in ED. Pt advised to follow up with orthopedics if symptoms persist for possibility of missed fracture diagnosis. Patient given brace while in ED, conservative therapy recommended and discussed. Patient will be dc home & is agreeable with above plan.   I personally performed the services described in this documentation, which was scribed in my presence. The recorded information has been reviewed and is accurate.    Arthor CaptainAbigail Jaymon Dudek, PA-C 10/07/14 1345

## 2014-10-07 NOTE — Discharge Instructions (Signed)
Jammed Finger A jammed finger is a term used to describe a variety of injuries. The injuries usually involve the joint in the middle of the finger (not the joint near the tip of the finger, and not the joint close to the hand). Usually, a jammed finger involves injured tendons or ligaments (sprain). CAUSES  "Jamming" a finger usually refers to "stubbing" the finger on an object, such as a ball during an athletic activity. Usually, the joint is extended at the time of injury, and the blow forces the joint further into extension than it normally goes. SYMPTOMS   Pain.  Swelling.  Discoloration and bruising around the joint.  Difficulty bending, straightening, and using the finger normally. DIAGNOSIS  An X-ray may be done to make sure there is no broken bone (fracture). TREATMENT   Put ice on the injured area.  Put ice in a plastic bag.  Place a towel between your skin and the bag.  Leave the ice on for 15-20 minutes at a time, 03-04 times a day.  Raise (elevate) the affected finger above the level of your heart to decrease swelling.  Take medicine as directed by your caregiver. Depending on the type of injury, your caregiver may also recommend that you:  "Buddy tape" the injured finger to the finger or fingers beside it.  Wear a protective splint.  Do strengthening exercises after the finger has begun to heal.  Do physical therapy to regain strength and mobility in the finger.  Follow up with a hand specialist. HOME CARE INSTRUCTIONS  Avoid activities that may injure the finger again until it is totally healed. SEEK IMMEDIATE MEDICAL CARE IF:   You develop pain that is more severe.  You develop increased swelling.  There is an obvious deformity in the joint.  You have severe bruising.  You have red or blue discoloration.  You or your child has an oral temperature above 102 F (38.9 C), not controlled by medicine.  You have an abnormally cold finger.  Feeling in  your finger is absent or decreasing. MAKE SURE YOU:   Understand these instructions.  Will watch your condition.  Will get help right away if you are not doing well or get worse. Document Released: 05/16/2010 Document Revised: 02/18/2012 Document Reviewed: 05/16/2010 [facility] Patient Information 2015 [place], [place]. This information is not intended to replace advice given to you by your health care provider. Make sure you discuss any questions you have with your health care provider.

## 2014-10-07 NOTE — ED Notes (Signed)
Pt reports hand injury, sts jammed on car door, swelling and bruising noted.

## 2014-10-07 NOTE — ED Provider Notes (Signed)
Medical screening examination/treatment/procedure(s) were performed by non-physician practitioner and as supervising physician I was immediately available for consultation/collaboration.  Loren Vicens L Mahlani Berninger, MD 10/07/14 1701 

## 2014-11-17 ENCOUNTER — Ambulatory Visit: Payer: Medicaid Other | Admitting: Internal Medicine

## 2014-11-23 ENCOUNTER — Ambulatory Visit: Payer: Medicaid Other | Admitting: Internal Medicine

## 2015-02-17 ENCOUNTER — Encounter: Payer: Self-pay | Admitting: *Deleted

## 2015-02-18 ENCOUNTER — Other Ambulatory Visit: Payer: Self-pay

## 2015-02-18 DIAGNOSIS — Z1231 Encounter for screening mammogram for malignant neoplasm of breast: Secondary | ICD-10-CM

## 2015-02-20 ENCOUNTER — Emergency Department (HOSPITAL_COMMUNITY): Payer: Medicaid Other

## 2015-02-20 ENCOUNTER — Emergency Department (HOSPITAL_COMMUNITY)
Admission: EM | Admit: 2015-02-20 | Discharge: 2015-02-20 | Disposition: A | Discharge status: Home / Self Care | Payer: Medicaid Other | Attending: Emergency Medicine | Admitting: Emergency Medicine

## 2015-02-20 ENCOUNTER — Encounter (HOSPITAL_COMMUNITY): Payer: Self-pay | Admitting: Nurse Practitioner

## 2015-02-20 DIAGNOSIS — W208XXA Other cause of strike by thrown, projected or falling object, initial encounter: Secondary | ICD-10-CM | POA: Diagnosis not present

## 2015-02-20 DIAGNOSIS — S9032XA Contusion of left foot, initial encounter: Secondary | ICD-10-CM | POA: Diagnosis not present

## 2015-02-20 DIAGNOSIS — Y998 Other external cause status: Secondary | ICD-10-CM | POA: Insufficient documentation

## 2015-02-20 DIAGNOSIS — Z720 Tobacco use: Secondary | ICD-10-CM | POA: Insufficient documentation

## 2015-02-20 DIAGNOSIS — Z7951 Long term (current) use of inhaled steroids: Secondary | ICD-10-CM | POA: Diagnosis not present

## 2015-02-20 DIAGNOSIS — Y9389 Activity, other specified: Secondary | ICD-10-CM | POA: Insufficient documentation

## 2015-02-20 DIAGNOSIS — F319 Bipolar disorder, unspecified: Secondary | ICD-10-CM | POA: Diagnosis not present

## 2015-02-20 DIAGNOSIS — Z79899 Other long term (current) drug therapy: Secondary | ICD-10-CM | POA: Insufficient documentation

## 2015-02-20 DIAGNOSIS — Y9289 Other specified places as the place of occurrence of the external cause: Secondary | ICD-10-CM | POA: Insufficient documentation

## 2015-02-20 DIAGNOSIS — S99922A Unspecified injury of left foot, initial encounter: Secondary | ICD-10-CM | POA: Diagnosis present

## 2015-02-20 DIAGNOSIS — I341 Nonrheumatic mitral (valve) prolapse: Secondary | ICD-10-CM | POA: Insufficient documentation

## 2015-02-20 DIAGNOSIS — J45909 Unspecified asthma, uncomplicated: Secondary | ICD-10-CM | POA: Diagnosis not present

## 2015-02-20 DIAGNOSIS — I10 Essential (primary) hypertension: Secondary | ICD-10-CM | POA: Insufficient documentation

## 2015-02-20 HISTORY — DX: Bipolar disorder, unspecified: F31.9

## 2015-02-20 MED ORDER — IBUPROFEN 400 MG PO TABS
400.0000 mg | ORAL_TABLET | Freq: Once | ORAL | Status: AC
  Administered 2015-02-20: 400 mg via ORAL
  Filled 2015-02-20: qty 1

## 2015-02-20 NOTE — ED Notes (Signed)
Declined W/C at D/C and was escorted to lobby by RN. 

## 2015-02-20 NOTE — ED Provider Notes (Signed)
CSN: [id number]     Arrival date & time 02/20/15  1139 History  This chart was scribed for a non-physician practitioner, Wynetta Emery, PA-C working with Benjiman Core, MD by [place] Peace, ED Scribe. The patient was seen in [facility]. The patient's care was started at 1:08 PM.     Chief Complaint  Patient presents with  . Foot Injury      Patient is a 44 y.o. female presenting with foot injury. The history is provided by the patient. No language interpreter was used.  Foot Injury HPI Comments: [name] is a 44 y.o. female who presents to the Emergency Department complaining of left foot injury that occurred earlier today while pt was doing laundry and had some weights from over her head drop on her foot. Pt reports current pain is very severe, and exacerbate her weightbearing. She has not taken any pain medications prior to arrival. Describes as throbbing, 8 out of 10. She adds that she is able to move her toes but it is difficult due to pain. History of hypertension. Pt is current everyday smoker.    Past Medical History  Diagnosis Date  . Asthma   . Hypertension   . Mitral valve prolapse   . Bipolar disorder    History reviewed. No pertinent past surgical history. History reviewed. No pertinent family history. History  Substance Use Topics  . Smoking status: Current Every Day Smoker -- 0.25 packs/day    Types: Cigarettes  . Smokeless tobacco: Not on file     Comment: Smoking less  . Alcohol Use: Yes     Comment: occassionally   OB History    No data available     Review of Systems  Musculoskeletal:       Left foot pain.   A complete 10 system review of systems was obtained and all systems are negative except as noted in the HPI and PMH.     Allergies  Review of patient's allergies indicates no known allergies.  Home Medications   Prior to Admission medications   Medication Sig Start Date End Date Taking? Authorizing Provider  albuterol-ipratropium  (COMBIVENT) 18-103 MCG/ACT inhaler Inhale 1-2 puffs into the lungs every 4 (four) hours as needed for wheezing or shortness of breath. 06/05/14   Marrian Salvage, MD  cetirizine (ZYRTEC) 5 MG tablet Take 1 tablet (5 mg total) by mouth daily. 08/19/14   Yolanda Manges, DO  hydrOXYzine (ATARAX/VISTARIL) 25 MG tablet Take 25 mg by mouth every 8 (eight) hours as needed for anxiety.    Historical Provider, MD  lisinopril (PRINIVIL,ZESTRIL) 40 MG tablet Take 1 tablet (40 mg total) by mouth daily. 08/19/14   Yolanda Manges, DO  mirtazapine (REMERON) 45 MG tablet Take 45 mg by mouth at bedtime.    Historical Provider, MD  mometasone (NASONEX) 50 MCG/ACT nasal spray Place 2 sprays into the nose daily as needed (allergies).     Historical Provider, MD  traMADol (ULTRAM) 50 MG tablet Take 1 tablet (50 mg total) by mouth every 6 (six) hours as needed. 10/07/14   Abigail Harris, PA-C   BP 127/84 mmHg  Pulse 84  Temp(Src) 97.8 F (36.6 C) (Oral)  Resp 16  Ht 5' 4.25" (1.632 m)  Wt 122 lb (55.339 kg)  BMI 20.78 kg/m2  SpO2 96% Physical Exam  Constitutional: She is oriented to person, place, and time. She appears well-developed and well-nourished. No distress.  HENT:  Head: Normocephalic and atraumatic.  Eyes: Conjunctivae  and EOM are normal.  Neck: Normal range of motion. Neck supple. No tracheal deviation present.  Cardiovascular: Normal rate, regular rhythm and intact distal pulses.   Pulmonary/Chest: Effort normal. No respiratory distress.  Abdominal: Soft. There is no tenderness.  Musculoskeletal: Normal range of motion. She exhibits tenderness. She exhibits no edema.       Feet:  Tenderness along the dorsum of the left foot. There is no overlying laceration or abrasion, no significant swelling or ecchymoses. She is distally neurovascularly intact. Dorsalis pedis is 2+.  Neurological: She is alert and oriented to person, place, and time.  Skin: Skin is warm and dry.  Psychiatric: She has a normal  mood and affect. Her behavior is normal.  Nursing note and vitals reviewed.   ED Course  Procedures (including critical care time) Labs Review Labs Reviewed - No data to display  Imaging Review Dg Foot Complete Left  02/20/2015   CLINICAL DATA:  Dumbbell fell off a shelf and struck her left foot.  EXAM: LEFT FOOT - COMPLETE 3+ VIEW  COMPARISON:  None.  FINDINGS: There is no evidence of fracture or dislocation. There is mild osteoarthritis of the first MTP joint. Soft tissues are unremarkable.  IMPRESSION: No acute osseous injury of the left foot.   Electronically Signed   By: Elige KoHetal  Patel   On: 02/20/2015 12:58     EKG Interpretation None     Medications - No data to display  1:10 PM- Treatment plan was discussed with patient who verbalizes understanding and agrees.   MDM   Final diagnoses:  Foot contusion, left, initial encounter    Filed Vitals:   02/20/15 1145  BP: 127/84  Pulse: 84  Temp: 97.8 F (36.6 C)  TempSrc: Oral  Resp: 16  Height: 5' 4.25" (1.632 m)  Weight: 122 lb (55.339 kg)  SpO2: 96%    Medications  ibuprofen (ADVIL,MOTRIN) tablet 400 mg (400 mg Oral Given 02/20/15 1315)    [name] is a pleasant 44 y.o. female presenting with pain to left foot after heavy weight fell on it this morning. Patient is ambulatory but without issue. She is neurovascularly intact. X-rays are negative. Patient will be given crutches, recommended rest, ice, compression elevation. I've advised her to follow with her primary care doctor if pain persists past 5-7 days.  Evaluation does not show pathology that would require ongoing emergent intervention or inpatient treatment. Pt is hemodynamically stable and mentating appropriately. Discussed findings and plan with patient/guardian, who agrees with care plan. All questions answered. Return precautions discussed and outpatient follow up given.    I personally performed the services described in this documentation, which was  scribed in my presence. The recorded information has been reviewed and is accurate.   Wynetta Emeryicole Riad Wagley, PA-C 02/20/15 1324  Benjiman CoreNathan Pickering, MD 02/20/15 [phone]

## 2015-02-20 NOTE — ED Notes (Signed)
She dropped a weight onto her L foot this am. shes had pain and trouble moving her toes since.

## 2015-02-20 NOTE — Discharge Instructions (Signed)
Rest, Ice intermittently (in the first 24-48 hours), Gentle compression with an Ace wrap, and elevate (Limb above the level of the heart) °  °Take up to 800mg of ibuprofen (that is usually 4 over the counter pills)  3 times a day for 5 days. Take with food. ° °Please follow with your primary care doctor in the next 2 days for a check-up. They must obtain records for further management.  ° °Do not hesitate to return to the Emergency Department for any new, worsening or concerning symptoms.  ° ° °Contusion °A contusion is a deep bruise. Contusions are the result of an injury that caused bleeding under the skin. The contusion may turn blue, purple, or yellow. Minor injuries will give you a painless contusion, but more severe contusions may stay painful and swollen for a few weeks.  °CAUSES  °A contusion is usually caused by a blow, trauma, or direct force to an area of the body. °SYMPTOMS  °· Swelling and redness of the injured area. °· Bruising of the injured area. °· Tenderness and soreness of the injured area. °· Pain. °DIAGNOSIS  °The diagnosis can be made by taking a history and physical exam. An X-ray, CT scan, or MRI may be needed to determine if there were any associated injuries, such as fractures. °TREATMENT  °Specific treatment will depend on what area of the body was injured. In general, the best treatment for a contusion is resting, icing, elevating, and applying cold compresses to the injured area. Over-the-counter medicines may also be recommended for pain control. Ask your caregiver what the best treatment is for your contusion. °HOME CARE INSTRUCTIONS  °· Put ice on the injured area. °¨ Put ice in a plastic bag. °¨ Place a towel between your skin and the bag. °¨ Leave the ice on for 15-20 minutes, 3-4 times a day, or as directed by your health care provider. °· Only take over-the-counter or prescription medicines for pain, discomfort, or fever as directed by your caregiver. Your caregiver may recommend  avoiding anti-inflammatory medicines (aspirin, ibuprofen, and naproxen) for 48 hours because these medicines may increase bruising. °· Rest the injured area. °· If possible, elevate the injured area to reduce swelling. °SEEK IMMEDIATE MEDICAL CARE IF:  °· You have increased bruising or swelling. °· You have pain that is getting worse. °· Your swelling or pain is not relieved with medicines. °MAKE SURE YOU:  °· Understand these instructions. °· Will watch your condition. °· Will get help right away if you are not doing well or get worse. °Document Released: 09/05/2005 Document Revised: 12/01/2013 Document Reviewed: 10/01/2011 °ExitCare® Patient Information ©2015 [employer], LLC. This information is not intended to replace advice given to you by your health care provider. Make sure you discuss any questions you have with your health care provider. ° °

## 2015-03-08 ENCOUNTER — Ambulatory Visit: Payer: Medicaid Other

## 2015-03-15 ENCOUNTER — Telehealth: Payer: Self-pay | Admitting: Internal Medicine

## 2015-03-15 NOTE — Telephone Encounter (Signed)
Call to patient to confirm appointment for 03/16/15 at 10:15 lmtcb

## 2015-03-16 ENCOUNTER — Other Ambulatory Visit (HOSPITAL_COMMUNITY)
Admission: RE | Admit: 2015-03-16 | Discharge: 2015-03-16 | Disposition: A | Discharge status: Home / Self Care | Payer: Medicaid Other | Source: Ambulatory Visit | Attending: Internal Medicine | Admitting: Internal Medicine

## 2015-03-16 ENCOUNTER — Ambulatory Visit (INDEPENDENT_AMBULATORY_CARE_PROVIDER_SITE_OTHER): Payer: Medicaid Other | Admitting: Internal Medicine

## 2015-03-16 ENCOUNTER — Encounter: Payer: Self-pay | Admitting: Internal Medicine

## 2015-03-16 VITALS — BP 139/89 | HR 77 | Temp 98.1°F | Ht 64.0 in | Wt 116.1 lb

## 2015-03-16 DIAGNOSIS — Z113 Encounter for screening for infections with a predominantly sexual mode of transmission: Secondary | ICD-10-CM | POA: Diagnosis present

## 2015-03-16 DIAGNOSIS — R748 Abnormal levels of other serum enzymes: Secondary | ICD-10-CM | POA: Diagnosis not present

## 2015-03-16 DIAGNOSIS — N898 Other specified noninflammatory disorders of vagina: Secondary | ICD-10-CM

## 2015-03-16 DIAGNOSIS — Z124 Encounter for screening for malignant neoplasm of cervix: Secondary | ICD-10-CM

## 2015-03-16 DIAGNOSIS — Z0000 Encounter for general adult medical examination without abnormal findings: Secondary | ICD-10-CM

## 2015-03-16 DIAGNOSIS — I10 Essential (primary) hypertension: Secondary | ICD-10-CM

## 2015-03-16 DIAGNOSIS — N760 Acute vaginitis: Secondary | ICD-10-CM | POA: Diagnosis present

## 2015-03-16 DIAGNOSIS — Z1151 Encounter for screening for human papillomavirus (HPV): Secondary | ICD-10-CM | POA: Insufficient documentation

## 2015-03-16 DIAGNOSIS — Z01419 Encounter for gynecological examination (general) (routine) without abnormal findings: Secondary | ICD-10-CM | POA: Insufficient documentation

## 2015-03-16 DIAGNOSIS — R945 Abnormal results of liver function studies: Secondary | ICD-10-CM | POA: Diagnosis present

## 2015-03-16 LAB — COMPLETE METABOLIC PANEL WITH GFR
ALBUMIN: 4.9 g/dL (ref 3.5–5.2)
ALT: 50 U/L — AB (ref 0–35)
AST: 93 U/L — ABNORMAL HIGH (ref 0–37)
Alkaline Phosphatase: 119 U/L — ABNORMAL HIGH (ref 39–117)
BUN: 7 mg/dL (ref 6–23)
CALCIUM: 10.1 mg/dL (ref 8.4–10.5)
CHLORIDE: 100 meq/L (ref 96–112)
CO2: 24 meq/L (ref 19–32)
Creat: 0.63 mg/dL (ref 0.50–1.10)
GFR, Est African American: >89 mL/min
GFR, Est Non African American: >89 mL/min
Glucose, Bld: 96 mg/dL (ref 70–99)
Potassium: 4.4 mEq/L (ref 3.5–5.3)
Sodium: 136 mEq/L (ref 135–145)
Total Bilirubin: 0.8 mg/dL (ref 0.2–1.2)
Total Protein: 8.2 g/dL (ref 6.0–8.3)

## 2015-03-16 NOTE — Patient Instructions (Signed)
1. I will call you if there are problems with your testing.  Please return to see me in 3 months.   2. Please take all medications as prescribed.     3. If you have worsening of your symptoms or new symptoms arise, please call the clinic [phone]), or go to the ER immediately if symptoms are severe.

## 2015-03-17 DIAGNOSIS — Z124 Encounter for screening for malignant neoplasm of cervix: Secondary | ICD-10-CM | POA: Insufficient documentation

## 2015-03-17 DIAGNOSIS — Z0000 Encounter for general adult medical examination without abnormal findings: Secondary | ICD-10-CM | POA: Insufficient documentation

## 2015-03-17 LAB — CERVICOVAGINAL ANCILLARY ONLY
Chlamydia: NEGATIVE
Neisseria Gonorrhea: NEGATIVE
WET PREP (BD AFFIRM): POSITIVE — AB

## 2015-03-17 LAB — CYTOLOGY - PAP

## 2015-03-17 MED ORDER — IPRATROPIUM-ALBUTEROL 18-103 MCG/ACT IN AERO: 1.0000 | INHALATION_SPRAY | RESPIRATORY_TRACT | Status: DC | PRN

## 2015-03-17 MED ORDER — LISINOPRIL 40 MG PO TABS: 40.0000 mg | ORAL_TABLET | Freq: Every day | ORAL | Status: DC

## 2015-03-17 MED ORDER — CETIRIZINE HCL 5 MG PO TABS: 5.0000 mg | ORAL_TABLET | Freq: Every day | ORAL | Status: DC

## 2015-03-17 NOTE — Assessment & Plan Note (Addendum)
Patient presents for PAP.  She denies hx of abnormal PAP.  She denies vaginal complaint.  When questioned about discharge she reports occasional white discharge but denies feeling of yeast infx, vaginal itching, pain or bleeding.   First menses at age 44.  She says her LMP was nearly 2 years ago.  She is unsure if other women in her family experienced early menopause because her mom and sister had gyn surgeries before menopause age (I believe she thinks they had hysterectomy and BSO because they underwent menopause post surgery but I did not specifically ask about BSO).  She has had 2 pregnancies and 2 vaginal deliveries.   There is a moderate amount of white, milky discharge on exam which seems excessive for physiologic discharge, perhaps c/w yeast. - pelvic exam, PAP and HPV co-testing performed today (with 16, 18 genotype reflex testing if HPV positive)  - wet prep sent for BV, trich and candida; GC/CT sent

## 2015-03-17 NOTE — Progress Notes (Signed)
Case discussed with Dr. Wilson soon after the resident saw the patient.  We reviewed the resident's history and exam and pertinent patient test results.  I agree with the assessment, diagnosis and plan of care documented in the resident's note. 

## 2015-03-17 NOTE — Assessment & Plan Note (Signed)
PAP today. She says she is scheduled for mammo this month.

## 2015-03-17 NOTE — Assessment & Plan Note (Signed)
BP Readings from Last 3 Encounters:  03/16/15 139/89  02/20/15 127/84  10/07/14 163/101    Lab Results  Component Value Date   NA 136 03/16/2015   K 4.4 03/16/2015   CREATININE 0.63 03/16/2015    Assessment: Blood pressure control:  well controlled Progress toward BP goal:   at goal Comments: I did not get to specifically talk about medication compliance because the visit was focused on PAP.  When I reviewed medications she said she was taking lisinopril, however, when I went to refill this medication I see that the last time I refilled it was 7 months ago with 2 refills (she should have run out 4 months ago).   Plan: Medications:  continue current medications:  Lisinopril 40mg  daily Educational resources provided: brochure (denies) Self management tools provided: home blood pressure logbook Other plans: CMP today.  RTC in 1 month for follow-up (will need to specifically ask about compliance with BP med at next visit).

## 2015-03-17 NOTE — Progress Notes (Signed)
   Subjective:    [name], female    DOB: 19-Mar-1971, 44 y.o.   MRN: [id number]  HPI Comments: [name] is a 44 year old woman with PMH as below here for PAP smear.  She denies hx of abnormal PAP or vaginal complaints today.  Please see problem based assessment and plan for details.     Past Medical History  Diagnosis Date  . Asthma   . Hypertension   . Mitral valve prolapse   . Bipolar disorder     Review of Systems  Constitutional: Negative for fever and chills.  Respiratory: Negative for shortness of breath.   Cardiovascular: Negative for chest pain.  Genitourinary: Negative for vaginal bleeding, vaginal discharge, vaginal pain and menstrual problem.       Filed Vitals:   03/16/15 1027  BP: 139/89  Pulse: 77  Temp: 98.1 F (36.7 C)  TempSrc: Oral  Height: 5\' 4"  (1.626 m)  Weight: 116 lb 1.6 oz (52.663 kg)  SpO2: 100%   Objective:   Physical Exam  Constitutional: She is oriented to person, place, and time. She appears well-developed. No distress.  HENT:  Head: Normocephalic and atraumatic.  Mouth/Throat: Oropharynx is clear and moist. No oropharyngeal exudate.  Eyes: EOM are normal. Pupils are equal, round, and reactive to light.  Cardiovascular: Normal rate, regular rhythm and normal heart sounds.  Exam reveals no gallop and no friction rub.   No murmur heard. Pulmonary/Chest: Effort normal and breath sounds normal. No respiratory distress. She has no wheezes. She has no rales.  Abdominal: Soft. Bowel sounds are normal. She exhibits no distension and no mass. There is no tenderness. There is no rebound and no guarding.  Genitourinary: Pelvic exam was performed with patient supine. No labial fusion. There is no rash, tenderness, lesion or injury on the right labia. There is no rash, tenderness, lesion or injury on the left labia. Uterus is not deviated, not enlarged, not fixed and not tender. Cervix exhibits no motion tenderness. Right adnexum  displays no mass, no tenderness and no fullness. Left adnexum displays no mass, no tenderness and no fullness. No erythema, tenderness or bleeding in the vagina. No signs of injury around the vagina. Vaginal discharge found.  There is a moderate amount of white discharge and I cannot clearly see the cervix through this discharge.  She has no pain on speculum or manual exam.   Musculoskeletal: Normal range of motion. She exhibits no edema or tenderness.  Neurological: She is alert and oriented to person, place, and time. No cranial nerve deficit.  Skin: Skin is warm. She is not diaphoretic.  Psychiatric: She has a normal mood and affect.  Vitals reviewed.         Assessment & Plan:  Please see problem based assessment and plan.

## 2015-03-17 NOTE — Assessment & Plan Note (Signed)
CMP today

## 2015-03-18 ENCOUNTER — Telehealth: Payer: Self-pay | Admitting: Internal Medicine

## 2015-03-18 NOTE — Telephone Encounter (Signed)
I attempted to call the patient to discuss lab results.  She needs an abdominal US for persistently elevated LFTs and elevated GGT.  Will plan to repeat CMP in 1 month.  Wet prep + for BV - will plan trx with Flagyl 500mg  BID x 7 days once I confirm that she is not drinking EtOH.  If she is drinking EtOH, will plan to use clindamycin 2% vaginal cream once daily qHS x 7days.

## 2015-03-20 ENCOUNTER — Other Ambulatory Visit: Payer: Self-pay | Admitting: Internal Medicine

## 2015-03-20 DIAGNOSIS — R945 Abnormal results of liver function studies: Principal | ICD-10-CM

## 2015-03-20 DIAGNOSIS — R7989 Other specified abnormal findings of blood chemistry: Secondary | ICD-10-CM

## 2015-03-22 ENCOUNTER — Telehealth: Payer: Self-pay | Admitting: Internal Medicine

## 2015-03-22 ENCOUNTER — Other Ambulatory Visit: Payer: Self-pay | Admitting: Internal Medicine

## 2015-03-22 DIAGNOSIS — B9689 Other specified bacterial agents as the cause of diseases classified elsewhere: Secondary | ICD-10-CM

## 2015-03-22 DIAGNOSIS — N760 Acute vaginitis: Principal | ICD-10-CM

## 2015-03-22 MED ORDER — CLINDAMYCIN PHOSPHATE 2 % VA CREA: 1.0000 | TOPICAL_CREAM | Freq: Every day | VAGINAL | Status: DC

## 2015-03-22 NOTE — Telephone Encounter (Signed)
I called and spoke with [name].  She reports social drinking and recently had some beers this weekend.  I will not use Flagyl given her recent EtOH and instead I have sent in rx for clindamycin cream.  I also explained that LFTs were slightly elevated again.  Abdominal US order previously.  She will get repeat hepatic function test when she comes for US.  Patient agreeable to all of the above.

## 2015-03-23 ENCOUNTER — Ambulatory Visit
Admission: RE | Admit: 2015-03-23 | Discharge: 2015-03-23 | Disposition: A | Discharge status: Home / Self Care | Payer: Medicaid Other | Source: Ambulatory Visit

## 2015-03-23 DIAGNOSIS — Z1231 Encounter for screening mammogram for malignant neoplasm of breast: Secondary | ICD-10-CM

## 2015-04-19 ENCOUNTER — Telehealth: Payer: Self-pay | Admitting: Internal Medicine

## 2015-04-19 NOTE — Telephone Encounter (Signed)
Call to patient to confirm appointment for 04/20/15 at 2:15 lmtcb ° °

## 2015-04-20 ENCOUNTER — Encounter: Payer: Self-pay | Admitting: Internal Medicine

## 2015-04-20 ENCOUNTER — Ambulatory Visit (INDEPENDENT_AMBULATORY_CARE_PROVIDER_SITE_OTHER): Payer: Medicaid Other | Admitting: Internal Medicine

## 2015-04-20 VITALS — BP 108/69 | HR 70 | Temp 98.4°F | Ht 64.0 in | Wt 123.9 lb

## 2015-04-20 DIAGNOSIS — R748 Abnormal levels of other serum enzymes: Secondary | ICD-10-CM

## 2015-04-20 DIAGNOSIS — I10 Essential (primary) hypertension: Secondary | ICD-10-CM

## 2015-04-20 DIAGNOSIS — J309 Allergic rhinitis, unspecified: Secondary | ICD-10-CM | POA: Diagnosis not present

## 2015-04-20 DIAGNOSIS — R945 Abnormal results of liver function studies: Secondary | ICD-10-CM | POA: Diagnosis present

## 2015-04-20 LAB — COMPLETE METABOLIC PANEL WITH GFR
ALT: 10 U/L (ref 0–35)
AST: 15 U/L (ref 0–37)
Albumin: 4.2 g/dL (ref 3.5–5.2)
Alkaline Phosphatase: 117 U/L (ref 39–117)
BUN: 11 mg/dL (ref 6–23)
CO2: 28 mEq/L (ref 19–32)
CREATININE: 0.63 mg/dL (ref 0.50–1.10)
Calcium: 10 mg/dL (ref 8.4–10.5)
Chloride: 102 mEq/L (ref 96–112)
GFR, Est Non African American: >89 mL/min
GLUCOSE: 83 mg/dL (ref 70–99)
Potassium: 4.1 mEq/L (ref 3.5–5.3)
Sodium: 137 mEq/L (ref 135–145)
Total Bilirubin: 0.3 mg/dL (ref 0.2–1.2)
Total Protein: 7.4 g/dL (ref 6.0–8.3)

## 2015-04-20 LAB — GAMMA GT: GGT: 23 U/L (ref 7–51)

## 2015-04-20 MED ORDER — CETIRIZINE HCL 10 MG PO TABS: 10.0000 mg | ORAL_TABLET | Freq: Every day | ORAL | Status: DC

## 2015-04-20 NOTE — Assessment & Plan Note (Addendum)
No hx of liver disease, no IVDU, no family hx of liver ds or autoimmune ds, negative HIV and hepatitis panel in 05/2014.  No physical signs or symptoms of liver ds.  There is no fatigue, jaundice, unexplained wt loss, RUQ pain to suggest PBC.  She reports excessive drinking after her mother's death last year and enzymes did improve with time.  She reports drinking excessively the weekend before she last saw me for her most recent labs.  She says she was out with family for someone's birthday and drank "two too many."  She says she rarely drinks that much and does not drink every day.  She says she has not had EtOH since that time (about four weeks ago).  It is possible that slight elevations could be due to occasional binge drinking.  We discussed the dangers of drinking excessively (even if only occasionally).  She understands and is agreeable to avoiding this in the future.  I do not think she is EtOH dependent.  Insurance did not cover US (apperantly it needs to be reordered because it was ordered nearly 1 year ago). - recheck CMP, GGT - check alpha 1 anti-trypsin given her obstructive lung disease - if ALP, AST, ALT or GGT still abnormal will proceed with abdominal [place]

## 2015-04-20 NOTE — Progress Notes (Signed)
Subjective:    Patient ID: Lenoria ChimeRhonda E Rezek, female    DOB: 1971-06-04, 44 y.o.   MRN: [id number]  HPI Comments: Ms. Fredricka BonineConnor is a 44 year old woman with PMH as below here for for follow-up of elevated liver enzymes.  Please see problem based charting for A&P.       Past Medical History  Diagnosis Date  . Asthma   . Hypertension   . Mitral valve prolapse   . Bipolar disorder    Current Outpatient Prescriptions on File Prior to Visit  Medication Sig Dispense Refill  . albuterol-ipratropium (COMBIVENT) 18-103 MCG/ACT inhaler Inhale 1-2 puffs into the lungs every 4 (four) hours as needed for wheezing or shortness of breath. 1 Inhaler 6  . cetirizine (ZYRTEC) 5 MG tablet Take 1 tablet (5 mg total) by mouth daily. 30 tablet 1  . clindamycin (CLEOCIN) 2 % vaginal cream Place 1 Applicatorful vaginally at bedtime. Use for 7 days and then stop. 40 g 0  . hydrOXYzine (ATARAX/VISTARIL) 25 MG tablet Take 25 mg by mouth every 8 (eight) hours as needed for anxiety.    Marland Kitchen. lisinopril (PRINIVIL,ZESTRIL) 40 MG tablet Take 1 tablet (40 mg total) by mouth daily. 30 tablet 3  . mirtazapine (REMERON) 45 MG tablet Take 45 mg by mouth at bedtime.    . mometasone (NASONEX) 50 MCG/ACT nasal spray Place 2 sprays into the nose daily as needed (allergies).      No current facility-administered medications on file prior to visit.    Review of Systems  Constitutional: Negative for fever, chills, appetite change and unexpected weight change.  Respiratory: Positive for cough and shortness of breath.        At baseline with asthma/allergy.  Responds to inhaler.  Cardiovascular: Negative for chest pain, palpitations and leg swelling.  Gastrointestinal: Negative for nausea, vomiting, abdominal pain, diarrhea, constipation and blood in stool.  Genitourinary: Negative for dysuria and hematuria.  Neurological: Positive for light-headedness. Negative for syncope.       Occasional lightheadedness when she gets up too  quickly in the morning.        Filed Vitals:   04/20/15 1408  BP: 108/69  Pulse: 70  Temp: 98.4 F (36.9 C)  TempSrc: Oral  Height: 5\' 4"  (1.626 m)  Weight: 123 lb 14.4 oz (56.201 kg)  SpO2: 100%    Objective:   Physical Exam  Constitutional: She is oriented to person, place, and time. She appears well-developed. No distress.  HENT:  Head: Normocephalic and atraumatic.  Mouth/Throat: Oropharynx is clear and moist. No oropharyngeal exudate.  Eyes: Conjunctivae and EOM are normal. Pupils are equal, round, and reactive to light. No scleral icterus.  Neck: Neck supple.  Cardiovascular: Normal rate, regular rhythm and normal heart sounds.  Exam reveals no gallop and no friction rub.   No murmur heard. Pulmonary/Chest: Breath sounds normal. No respiratory distress. She has no wheezes. She has no rales.  Abdominal: Soft. Bowel sounds are normal. She exhibits no distension and no mass. There is no tenderness. There is no rebound and no guarding.  No ascites, no HSM  Musculoskeletal: Normal range of motion. She exhibits no edema or tenderness.  Neurological: She is alert and oriented to person, place, and time. No cranial nerve deficit.  Skin: Skin is warm. She is not diaphoretic.  Psychiatric: She has a normal mood and affect. Her behavior is normal. Judgment and thought content normal.  Vitals reviewed.  Assessment & Plan:  Please see problem based charting for A&P.

## 2015-04-20 NOTE — Assessment & Plan Note (Signed)
BP Readings from Last 3 Encounters:  04/20/15 108/69  03/16/15 139/89  02/20/15 127/84    Lab Results  Component Value Date   NA 136 03/16/2015   K 4.4 03/16/2015   CREATININE 0.63 03/16/2015    Assessment: Blood pressure control:  well controlled Progress toward BP goal:   at goal Comments: Compliant with lisinopril 40mg  daily.  Plan: Medications:  continue current medications:  Lisinopril 40mg  daily. Educational resources provided: brochure Self management tools provided:   Other plans: RTC in 6 months.

## 2015-04-20 NOTE — Assessment & Plan Note (Signed)
She feels 5mg  cetirizine not working as well for allergy symptoms recently.  Will try 10mg  daily.

## 2015-04-20 NOTE — Patient Instructions (Addendum)
1. I will call you if there are problems with your labs.  If you still need an abdominal ultrasound, we will call you to schedule it.     2. Please take all medications as prescribed.  You can take two of your 5mg  cetirizine (Zyrtec) every day until you run out then you can pick up the 10mg  tablets and take 1 tablet daily to help with your allergy symptoms.    3. If you have worsening of your symptoms or new symptoms arise, please call the clinic [phone]), or go to the ER immediately if symptoms are severe.   Please come back to see me in 6 months or sooner if you have problems.

## 2015-04-21 NOTE — Progress Notes (Signed)
Internal Medicine Clinic Attending  Case discussed with Dr. Wilson soon after the resident saw the patient.  We reviewed the resident's history and exam and pertinent patient test results.  I agree with the assessment, diagnosis, and plan of care documented in the resident's note.  

## 2015-04-22 LAB — ALPHA-1-ANTITRYPSIN: A1 ANTITRYPSIN SER: 167 mg/dL (ref 83–199)

## 2015-05-19 NOTE — Addendum Note (Signed)
Addended by: Remus Blake on: 05/19/2015 11:56 AM   Modules accepted: Orders

## 2015-06-28 IMAGING — CR DG CHEST 1V PORT
1 series · 1 of 1 positions shown · non-contrast
Comparison: Two view chest dated 10/21/2009

CLINICAL DATA: SHORTNESS OF BREATH

EXAM:
PORTABLE CHEST - 1 VIEW

[portable]
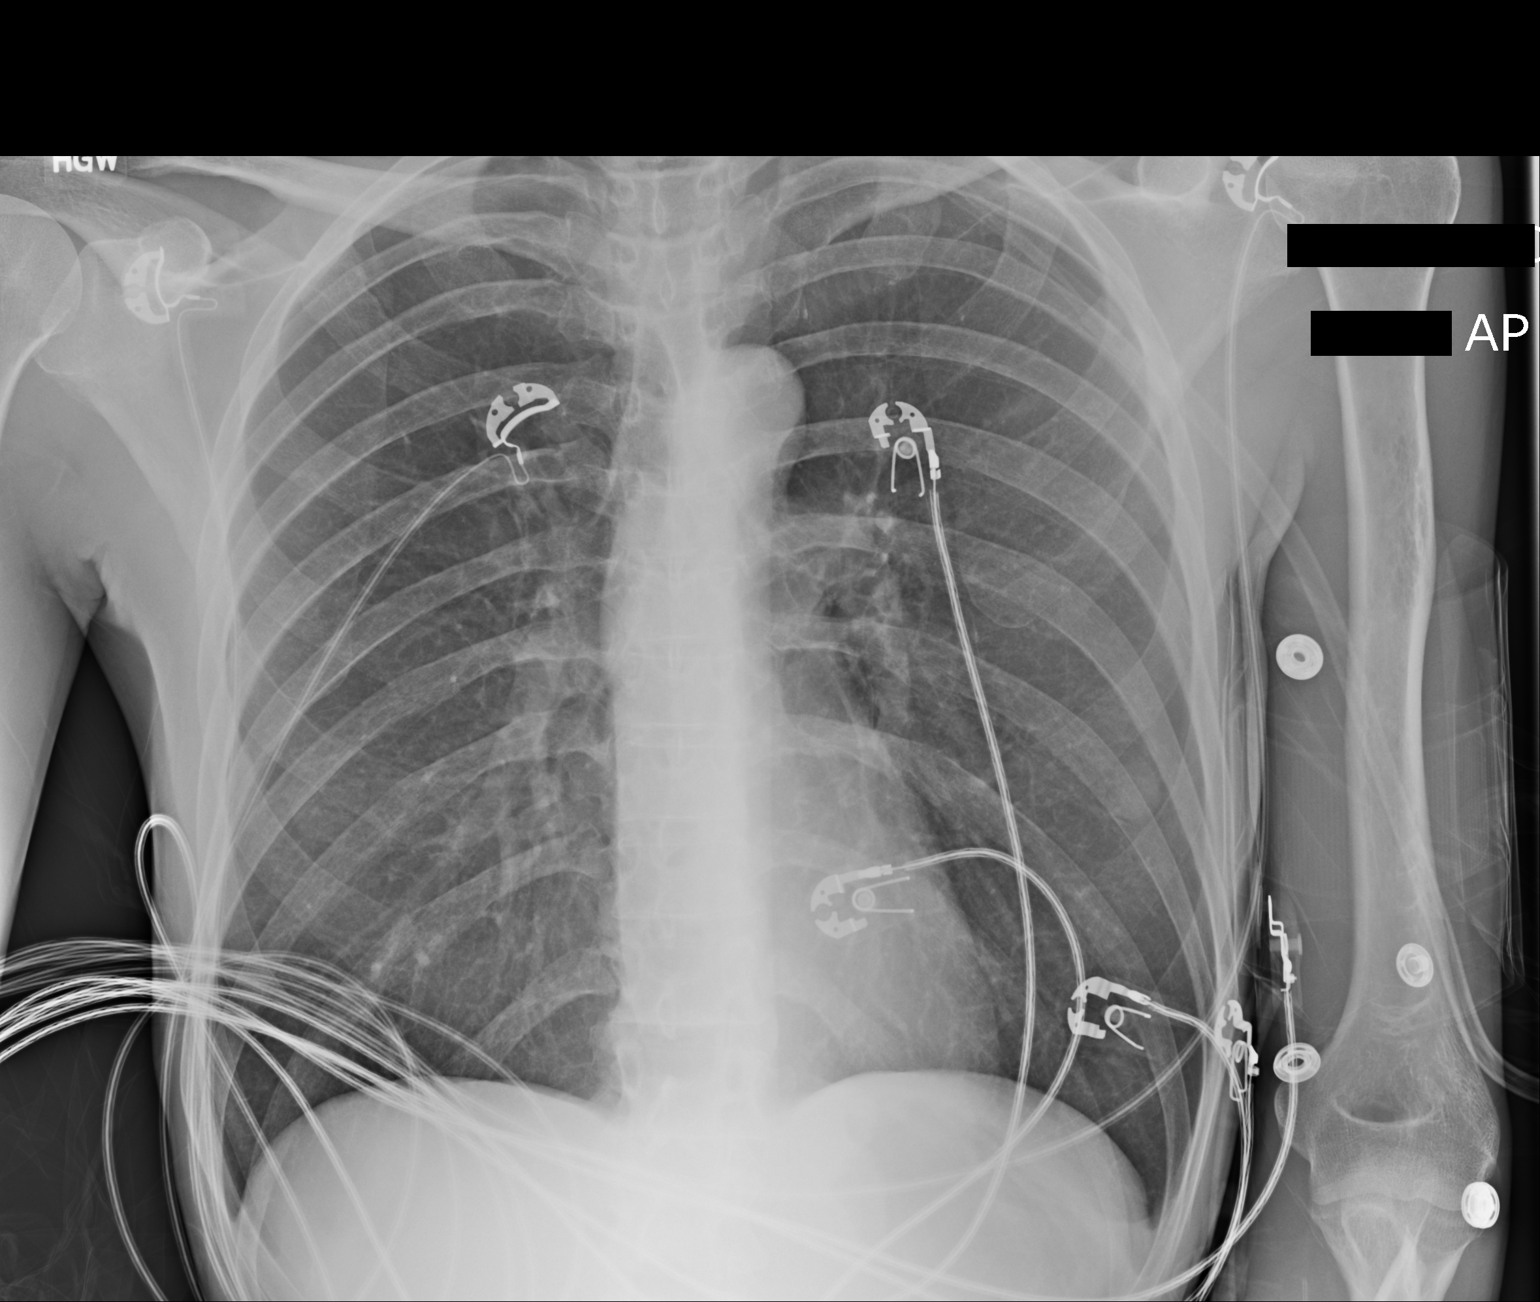

[1 of 1 positions shown; findings below may reference images not displayed]

FINDINGS: The heart size and mediastinal contours are within normal limits.
Both lungs are clear. The visualized skeletal structures are
unremarkable.
IMPRESSION: No active disease.

## 2015-06-28 IMAGING — CT CT ANGIO CHEST
2 of 8 series · 19 of 46 positions shown · IV contrast (omnipaque)
Comparison: Chest radiograph June 04, 2014

CLINICAL DATA: Shortness of breath, chest pain, oxygen
desaturation, history of asthma.

EXAM:
CT ANGIOGRAPHY CHEST WITH CONTRAST
TECHNIQUE: Multidetector CT imaging of the chest was performed using the
standard protocol during bolus administration of intravenous
contrast. Multiplanar CT image reconstructions and MIPs were
obtained to evaluate the vascular anatomy.
CONTRAST:  80mL OMNIPAQUE IOHEXOL 350 MG/ML SOLN

[Series 5: thins · axial · 0.56mm/px · z∈[+1025,+1265]mm · 16 of 266 slices shown]
[im 13/266  lung]
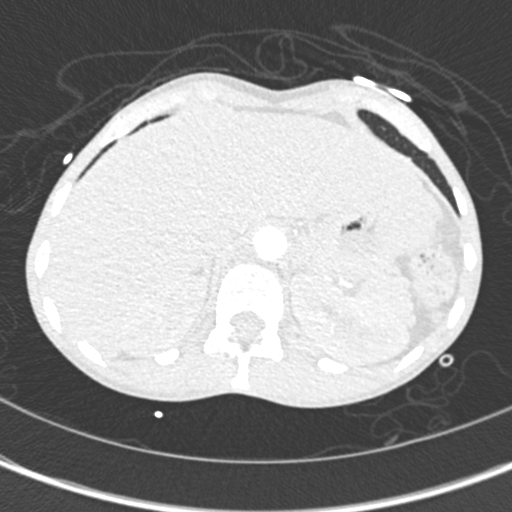
[im 25/266  soft-tissue]
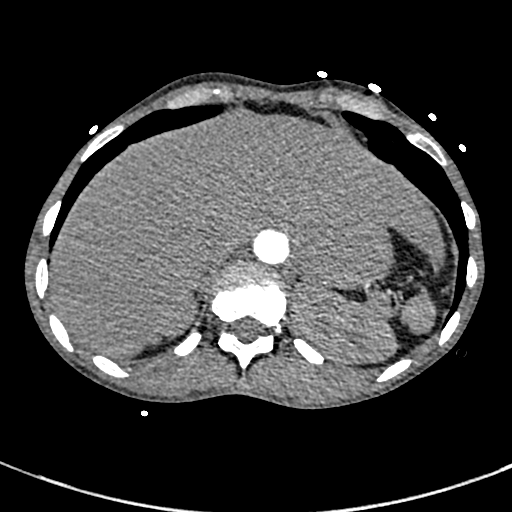
[im 49/266  lung]
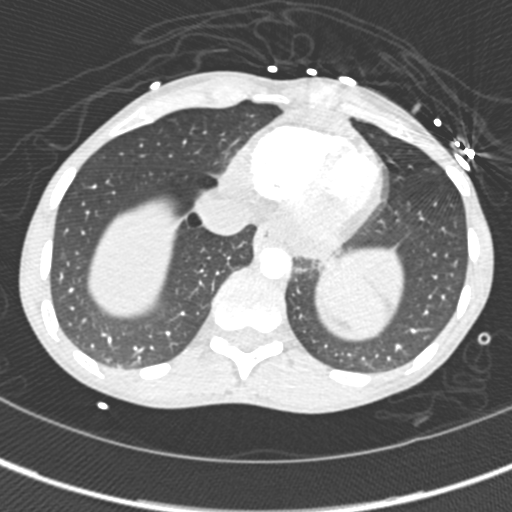
[im 61/266  soft-tissue]
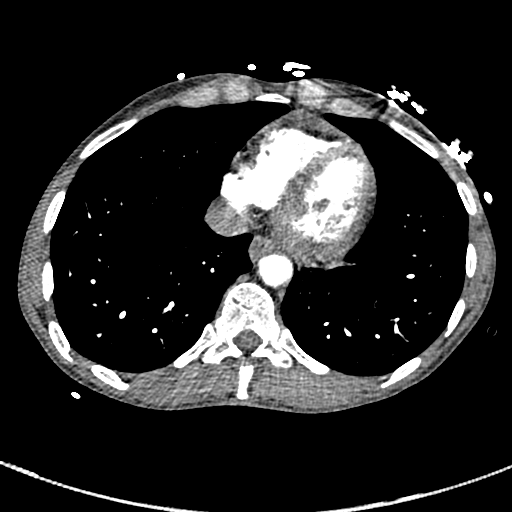
[im 73/266  lung]
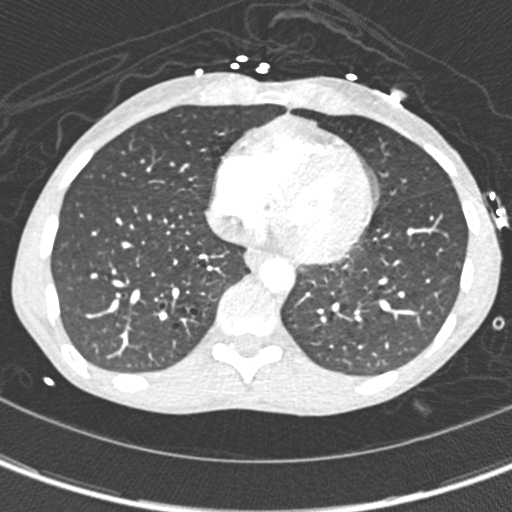
[im 97/266  soft-tissue]
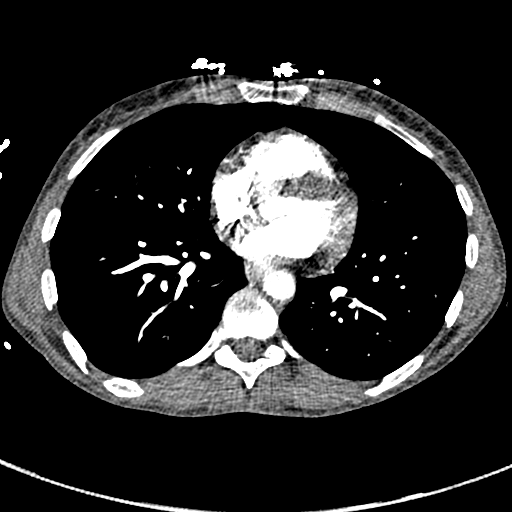
[im 109/266  lung]
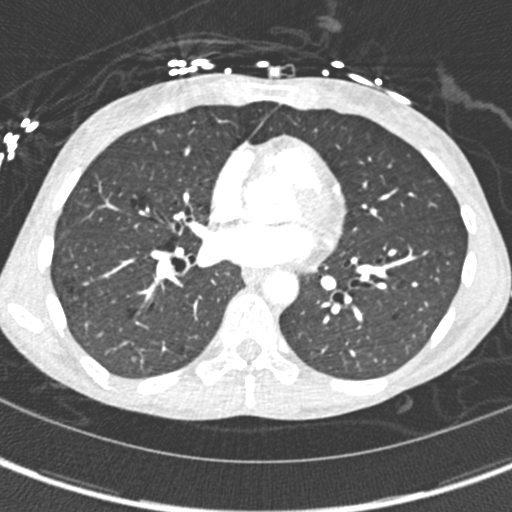
[im 121/266  soft-tissue]
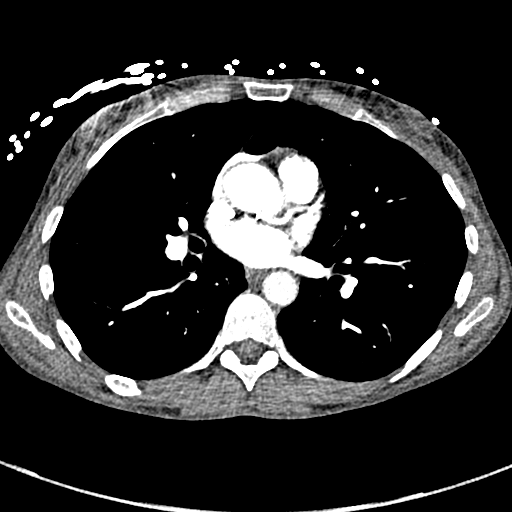
[im 145/266  lung]
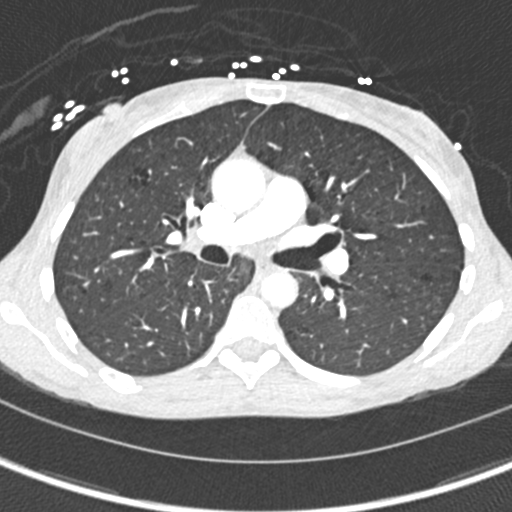
[im 157/266  soft-tissue]
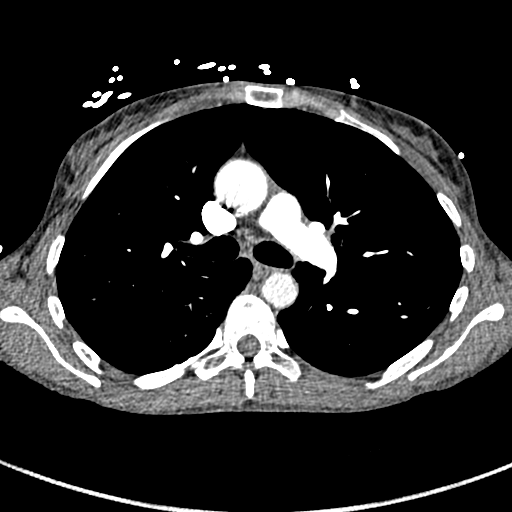
[im 169/266  lung]
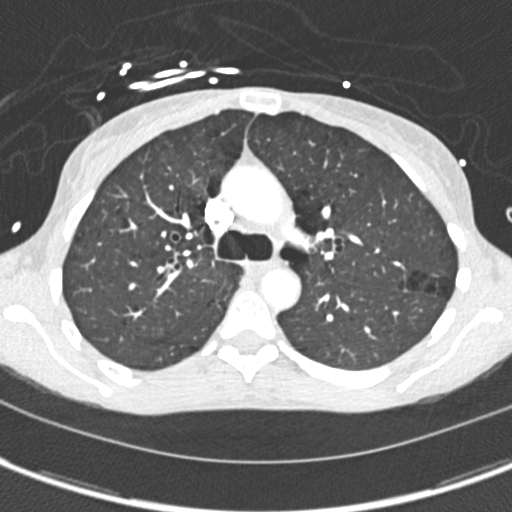
[im 193/266  soft-tissue]
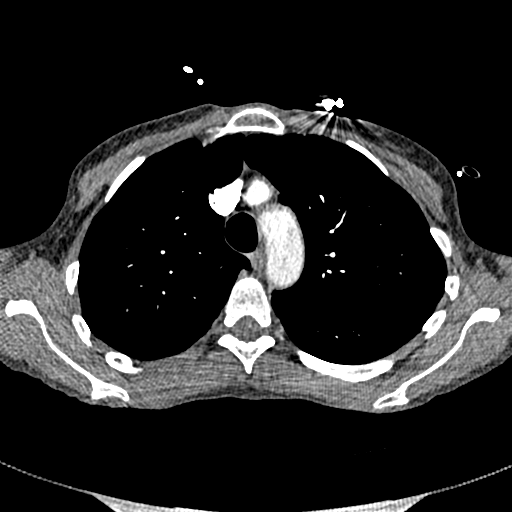
[im 205/266  lung]
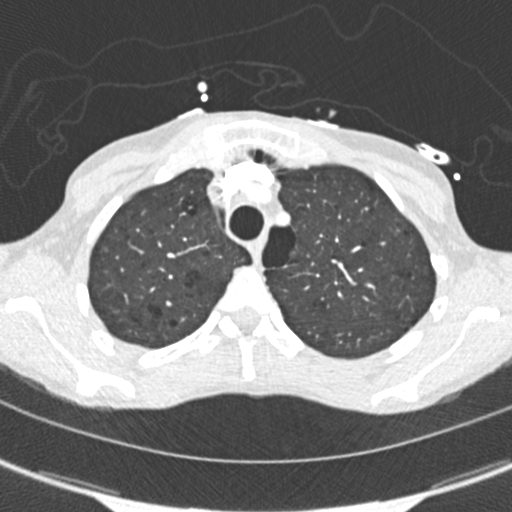
[im 217/266  soft-tissue]
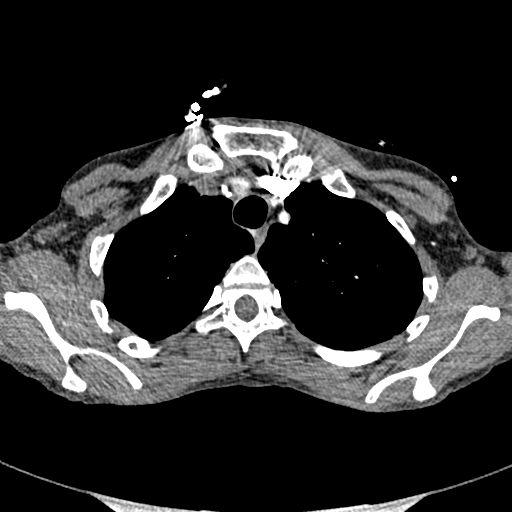
[im 241/266  lung]
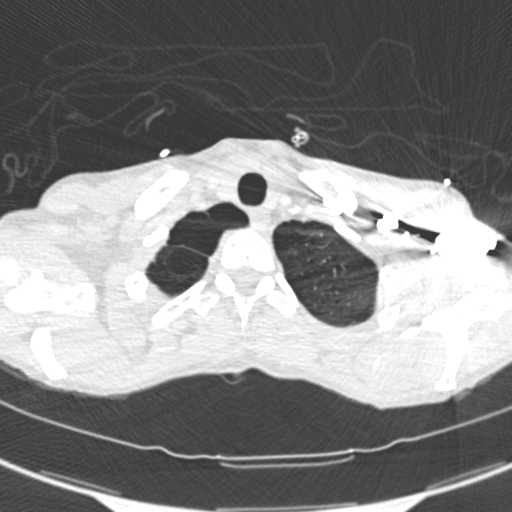
[im 253/266  soft-tissue]
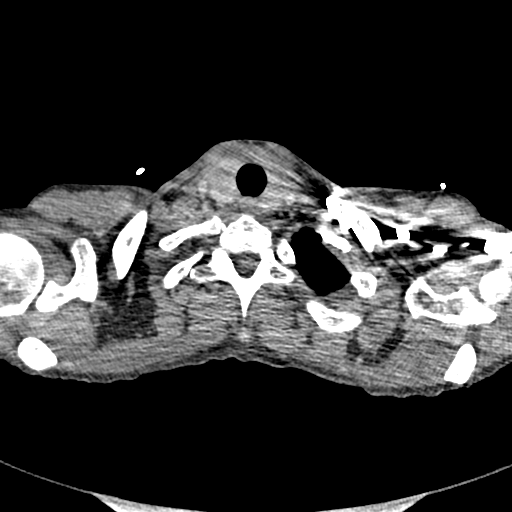

[Series 7: coronal mpr · coronal · 0.59mm/px · 3 of 91 slices shown]
[im 23/91  soft-tissue]
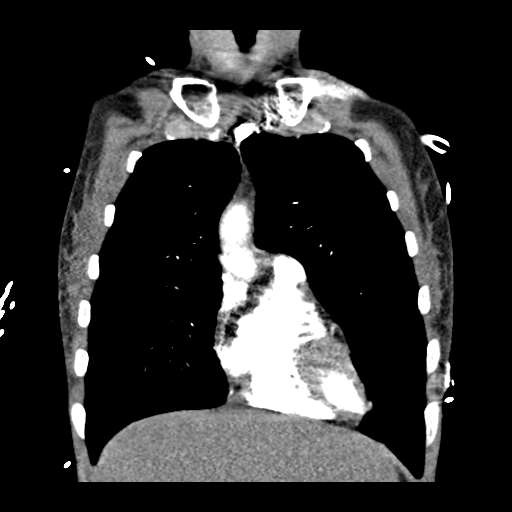
[im 46/91  soft-tissue]
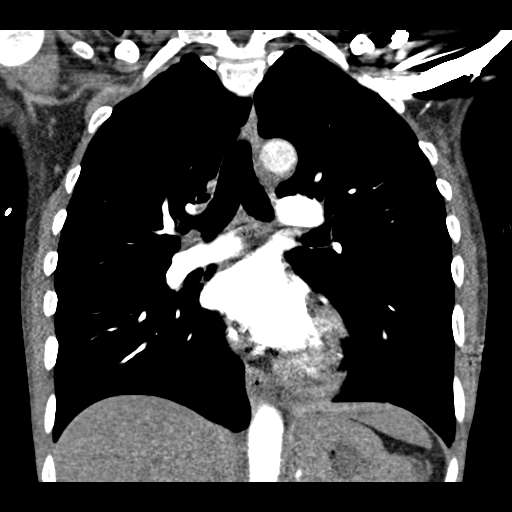
[im 68/91  soft-tissue]
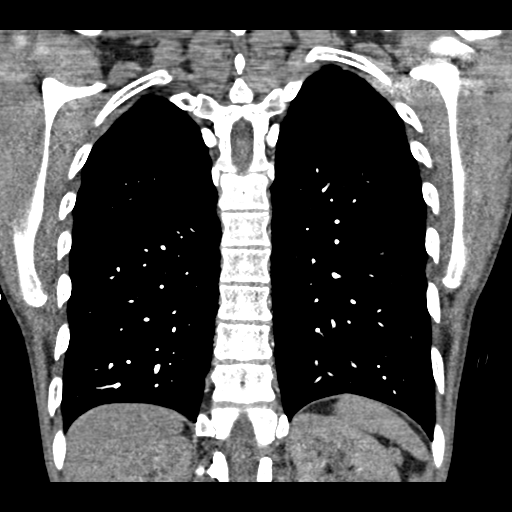

[19 of 46 positions shown; findings below may reference images not displayed]

FINDINGS: Adequate contrast opacification of the pulmonary artery's. Main
pulmonary artery is not enlarged. No pulmonary arterial filling
defects to the level of the subsegmental branches.

Heart is unremarkable, no right heart strain. Pericardial wall
thickening anteriorly without effusion. Thoracic aorta is normal
course and caliber, unremarkable, mild calcific atherosclerosis with
2 vessel arch, a normal variant. No lymphadenopathy by CT size
criteria. Tracheobronchial tree is patent, no pneumothorax. Moderate
to severe centrilobular emphysema with apical bullous changes.
Increased lung volumes. 3 mm right lower lobe sub solid pulmonary
nodule, below size surveillance recommendations.

Included view of the abdomen is unremarkable. Visualized soft
tissues and included osseous structures appear normal.

Review of the MIP images confirms the above findings.
IMPRESSION: No acute pulmonary embolism nor acute cardiopulmonary process.

Moderate to severe emphysema with apical bullous changes.

  By: Elsamowal Viedehof

## 2015-12-21 ENCOUNTER — Other Ambulatory Visit: Payer: Self-pay | Admitting: Internal Medicine

## 2015-12-29 ENCOUNTER — Encounter: Payer: Self-pay | Admitting: Internal Medicine

## 2016-02-02 ENCOUNTER — Ambulatory Visit (INDEPENDENT_AMBULATORY_CARE_PROVIDER_SITE_OTHER): Payer: Medicaid Other | Admitting: Internal Medicine

## 2016-02-02 VITALS — BP 146/89 | HR 101 | Temp 98.2°F | Ht 64.0 in | Wt 128.8 lb

## 2016-02-02 DIAGNOSIS — I10 Essential (primary) hypertension: Secondary | ICD-10-CM

## 2016-02-02 DIAGNOSIS — Z79899 Other long term (current) drug therapy: Secondary | ICD-10-CM | POA: Diagnosis not present

## 2016-02-02 DIAGNOSIS — J3089 Other allergic rhinitis: Secondary | ICD-10-CM

## 2016-02-02 DIAGNOSIS — E559 Vitamin D deficiency, unspecified: Secondary | ICD-10-CM

## 2016-02-02 DIAGNOSIS — J019 Acute sinusitis, unspecified: Secondary | ICD-10-CM

## 2016-02-02 DIAGNOSIS — Z0000 Encounter for general adult medical examination without abnormal findings: Secondary | ICD-10-CM

## 2016-02-02 DIAGNOSIS — J309 Allergic rhinitis, unspecified: Secondary | ICD-10-CM

## 2016-02-02 MED ORDER — MONTELUKAST SODIUM 10 MG PO TABS: 10.0000 mg | ORAL_TABLET | Freq: Every day | ORAL | Status: DC

## 2016-02-02 MED ORDER — VITAMIN D (CHOLECALCIFEROL) 25 MCG (1000 UT) PO TABS: 1.0000 | ORAL_TABLET | Freq: Every day | ORAL | Status: DC

## 2016-02-02 MED ORDER — LISINOPRIL 40 MG PO TABS: ORAL_TABLET | ORAL | Status: DC

## 2016-02-02 MED ORDER — AMOXICILLIN-POT CLAVULANATE 875-125 MG PO TABS: 1.0000 | ORAL_TABLET | Freq: Two times a day (BID) | ORAL | Status: DC

## 2016-02-02 NOTE — Patient Instructions (Signed)
-  Start taking augmentin twice a day for 7 days for your sinus infection -Start taking singular every day for allergies in addition to cetirizine and nasal spray and irrigation  -I refilled your lisinopril for 90-day supply -Start taking vitamin D 1000 U daily, I sent in a prescription  -Please come back in 1-2 months to see if your allergies are better, we may need to refer you to an allergist -Very nice meeting you!  General Instructions:   Please bring your medicines with you each time you come to clinic.  Medicines may include prescription medications, over-the-counter medications, herbal remedies, eye drops, vitamins, or other pills.   Progress Toward Treatment Goals:  No flowsheet data found.  Self Care Goals & Plans:  Self Care Goal 04/20/2015  Manage my medications take my medicines as prescribed; bring my medications to every visit; refill my medications on time  Eat healthy foods drink diet soda or water instead of juice or soda; eat more vegetables; eat foods that are low in salt; eat baked foods instead of fried foods; eat fruit for snacks and desserts  Stop smoking call [facility] (1-800-QUIT-NOW)    No flowsheet data found.   Care Management & Community Referrals:  No flowsheet data found.

## 2016-02-02 NOTE — Progress Notes (Signed)
[name], female   DOB: [birth year], 45 y.o.   MRN: [id number]    Subjective:   [name] female   DOB: 06/04/1971 45 y.o.   MRN: [id number]  HPI: [name] is a 45 y.o. very pleasant woman with past medical history of hypertension, asthma, allergic rhinitis, vitamin D insufficiency, and chronic normocytic anemia who presents with chief complaint of sinus pressure.   She reports having history of sinusitis with sinus pressure and tenderness (R>L) for the past 1.5 weeks with nasal congestion, green-colored nasal discharge at times, and chills but denies fever or ear pain. She denies recent sick contacts.   She has year-long allergic rhinitis that is worse in the spring. Sbe used to see an allergist and was on singular with better controlled symptoms. She is compliant with taking nasonex, zyrtec daily, and nasal saline irrigation. She also has cough and PND. She feels that her symptoms are not well-controlled.   She is compliant with taking lisinopril for hypertension. She has headache but denies blurry vision, chest pain, LE edema, or lightheadedness.   She had vitamin D insufficiency with level of 21 on 08/19/14 and not currently on replacement therapy. She denies fall or fracture.    Past Medical History  Diagnosis Date  . Asthma   . Hypertension   . Mitral valve prolapse   . Bipolar disorder    Current Outpatient Prescriptions  Medication Sig Dispense Refill  . albuterol-ipratropium (COMBIVENT) 18-103 MCG/ACT inhaler Inhale 1-2 puffs into the lungs every 4 (four) hours as needed for wheezing or shortness of breath. 1 Inhaler 6  . cetirizine (ZYRTEC) 10 MG tablet Take 1 tablet (10 mg total) by mouth daily. 30 tablet 3  . hydrOXYzine (ATARAX/VISTARIL) 25 MG tablet Take 25 mg by mouth every 8 (eight) hours as needed for anxiety.    Marland Kitchen lisinopril (PRINIVIL,ZESTRIL) 40 MG tablet TAKE 1 TABLET(40 MG) BY MOUTH DAILY 30 tablet 4  . mirtazapine  (REMERON) 45 MG tablet Take 45 mg by mouth at bedtime.    . mometasone (NASONEX) 50 MCG/ACT nasal spray Place 2 sprays into the nose daily as needed (allergies).      No current facility-administered medications for this visit.   No family history on file. Social History   Social History  . Marital Status: Single    Spouse Name: N/A  . Number of Children: N/A  . Years of Education: N/A   Social History Main Topics  . Smoking status: Current Every Day Smoker -- 0.10 packs/day    Types: Cigarettes  . Smokeless tobacco: Not on file     Comment: Smoking less.  Smokes 3 per day  . Alcohol Use: 0.0 oz/week    0 Standard drinks or equivalent per week     Comment: occassionally  . Drug Use: Yes    Special: Marijuana     Comment: once a week  . Sexual Activity: Not on file   Other Topics Concern  . Not on file   Social History Narrative   Review of Systems: Review of Systems  Constitutional: Positive for chills. Negative for fever and malaise/fatigue.  HENT: Positive for congestion. Negative for ear pain and sore throat.        Sinus tenderness R>L, PND  Eyes: Negative for blurred vision.  Respiratory: Positive for cough. Negative for shortness of breath and wheezing.   Cardiovascular: Negative for chest pain and leg swelling.  Gastrointestinal: Negative for nausea, vomiting, abdominal  pain, diarrhea and constipation.  Genitourinary: Negative for dysuria, urgency and frequency.  Neurological: Positive for headaches. Negative for dizziness.  Endo/Heme/Allergies: Positive for environmental allergies (year-long).  Psychiatric/Behavioral: Negative for depression.     Objective:  Physical Exam: Filed Vitals:   02/02/16 1035  BP: 146/89  Pulse: 101  Temp: 98.2 F (36.8 C)  TempSrc: Oral  Height:  (1.626 m)  Weight: 128 lb 12.8 oz (58.423 kg)  SpO2: 100%   Physical Exam  Constitutional: She is oriented to person, place, and time. She appears well-developed and  well-nourished. No distress.  HENT:  Head: Normocephalic and atraumatic.  Right Ear: External ear normal.  Left Ear: External ear normal.  Nose: Nose normal.  Mouth/Throat: Oropharynx is clear and moist. No oropharyngeal exudate.  Maxillary and ethmoid sinus tenderness, R>L  Eyes: Conjunctivae and EOM are normal. Pupils are equal, round, and reactive to light. Right eye exhibits no discharge. Left eye exhibits no discharge. No scleral icterus.  Neck: Normal range of motion. Neck supple.  Cardiovascular: Normal rate, regular rhythm and normal heart sounds.   Pulmonary/Chest: Effort normal and breath sounds normal. No respiratory distress. She has no wheezes. She has no rales.  Abdominal: Soft. Bowel sounds are normal. She exhibits no distension. There is no tenderness. There is no rebound and no guarding.  Musculoskeletal: Normal range of motion. She exhibits no edema or tenderness.  Neurological: She is alert and oriented to person, place, and time.  Skin: Skin is warm. No rash noted. She is not diaphoretic. No erythema. No pallor.  Psychiatric: She has a normal mood and affect. Her behavior is normal. Judgment and thought content normal.    Assessment & Plan:   Please see problem list for problem-based assessment and plan

## 2016-02-04 DIAGNOSIS — J019 Acute sinusitis, unspecified: Secondary | ICD-10-CM | POA: Insufficient documentation

## 2016-02-04 DIAGNOSIS — E559 Vitamin D deficiency, unspecified: Secondary | ICD-10-CM | POA: Insufficient documentation

## 2016-02-04 NOTE — Assessment & Plan Note (Addendum)
Assessment: Pt with year-round allergic rhinitis compliant with nasal corticosteroid and anti-histamine therapy who presents with uncontrolled symptoms.  Plan:  -Prescribe singulair 10 mg daily in setting of asthma -Continue nasonex 2 sprays in each nare daily  -Continue cetirizine 10 mg daily  -Continue saline nasal irrigation -If symptoms do not improve, will need referral to allergy & immunology for further therapy such as allergy shots or xolair

## 2016-02-04 NOTE — Assessment & Plan Note (Addendum)
Assessment: Pt with >10 day history of sinus tenderness worse on right and purulent nasal discharge with chills in setting of uncontrolled allergic rhinitis most likely due to acute bacterial rhinosinusitis.    Plan: -Prescribe augmentin 875-125 mg BID for 7 days -Continue nasonex 2 sprays in each nare daily  -Continue nasal saline irrigation  -Pt instructed to return if symptoms do not improve or worsen

## 2016-02-04 NOTE — Assessment & Plan Note (Addendum)
Assessment: Pt with well-controlled hypertension compliant with one-class (ACEi) anti-hypertensive therapy who presents with blood pressure of 146/89.   Plan:  -BP 146/89 not at goal <140/90 however usually controlled -Refill lisinopril 40 mg daily, but not ideal choice as monotherapy for AA race and no CKD, proteinuria, or DM  -Consider changing lisinopril to amlodipine or HCTZ if BP continues to be uncontrolled

## 2016-02-04 NOTE — Assessment & Plan Note (Signed)
-  Pt declined annual influenza vaccination -Pt declined tdap vaccination at this time, inquire again at next visit  -Obtain CMP and CBC at next visit, declined lab testing today

## 2016-02-04 NOTE — Assessment & Plan Note (Signed)
Assessment: Pt with vitamin D insufficiency with level of 21 on 08/19/14 not on replacement therapy who presents with no recent fall or fracture.   Plan:  -Prescribe cholecalciferol 1000 U daily

## 2016-02-06 NOTE — Progress Notes (Signed)
Internal Medicine Clinic Attending  Case discussed with Dr. Rabbani soon after the resident saw the patient.  We reviewed the resident's history and exam and pertinent patient test results.  I agree with the assessment, diagnosis, and plan of care documented in the resident's note.  

## 2016-02-14 ENCOUNTER — Other Ambulatory Visit: Payer: Self-pay

## 2016-02-14 DIAGNOSIS — Z1231 Encounter for screening mammogram for malignant neoplasm of breast: Secondary | ICD-10-CM

## 2016-03-23 ENCOUNTER — Ambulatory Visit: Payer: Medicaid Other

## 2016-04-10 ENCOUNTER — Ambulatory Visit: Payer: Medicaid Other

## 2016-04-27 ENCOUNTER — Ambulatory Visit
Admission: RE | Admit: 2016-04-27 | Discharge: 2016-04-27 | Disposition: A | Discharge status: Home / Self Care | Payer: Medicaid Other | Source: Ambulatory Visit

## 2016-04-27 DIAGNOSIS — Z1231 Encounter for screening mammogram for malignant neoplasm of breast: Secondary | ICD-10-CM

## 2016-05-23 ENCOUNTER — Encounter: Payer: Self-pay | Admitting: *Deleted

## 2016-06-14 ENCOUNTER — Encounter (HOSPITAL_COMMUNITY): Payer: Self-pay

## 2016-06-14 ENCOUNTER — Emergency Department (HOSPITAL_COMMUNITY): Payer: Medicaid Other

## 2016-06-14 ENCOUNTER — Inpatient Hospital Stay (HOSPITAL_COMMUNITY)
Admission: EM | Admit: 2016-06-14 | Discharge: 2016-06-16 | DRG: 603 | Disposition: A | Discharge status: Home / Self Care | Payer: Medicaid Other | Attending: Internal Medicine | Admitting: Internal Medicine

## 2016-06-14 DIAGNOSIS — Z23 Encounter for immunization: Secondary | ICD-10-CM | POA: Diagnosis not present

## 2016-06-14 DIAGNOSIS — F17210 Nicotine dependence, cigarettes, uncomplicated: Secondary | ICD-10-CM | POA: Diagnosis present

## 2016-06-14 DIAGNOSIS — L03113 Cellulitis of right upper limb: Secondary | ICD-10-CM | POA: Diagnosis present

## 2016-06-14 DIAGNOSIS — I341 Nonrheumatic mitral (valve) prolapse: Secondary | ICD-10-CM | POA: Diagnosis present

## 2016-06-14 DIAGNOSIS — F319 Bipolar disorder, unspecified: Secondary | ICD-10-CM | POA: Diagnosis present

## 2016-06-14 DIAGNOSIS — B9689 Other specified bacterial agents as the cause of diseases classified elsewhere: Secondary | ICD-10-CM

## 2016-06-14 DIAGNOSIS — W260XXA Contact with knife, initial encounter: Secondary | ICD-10-CM | POA: Diagnosis present

## 2016-06-14 DIAGNOSIS — I10 Essential (primary) hypertension: Secondary | ICD-10-CM | POA: Diagnosis present

## 2016-06-14 DIAGNOSIS — L0390 Cellulitis, unspecified: Secondary | ICD-10-CM | POA: Insufficient documentation

## 2016-06-14 DIAGNOSIS — J45909 Unspecified asthma, uncomplicated: Secondary | ICD-10-CM | POA: Diagnosis present

## 2016-06-14 DIAGNOSIS — L03011 Cellulitis of right finger: Secondary | ICD-10-CM

## 2016-06-14 DIAGNOSIS — F419 Anxiety disorder, unspecified: Secondary | ICD-10-CM | POA: Diagnosis present

## 2016-06-14 DIAGNOSIS — E559 Vitamin D deficiency, unspecified: Secondary | ICD-10-CM | POA: Diagnosis present

## 2016-06-14 DIAGNOSIS — J309 Allergic rhinitis, unspecified: Secondary | ICD-10-CM | POA: Diagnosis present

## 2016-06-14 HISTORY — DX: Anemia, unspecified: D64.9

## 2016-06-14 HISTORY — DX: Cellulitis of right finger: L03.011

## 2016-06-14 HISTORY — DX: Unspecified chronic bronchitis: J42

## 2016-06-14 LAB — CBC WITH DIFFERENTIAL/PLATELET
Basophils Absolute: 0 10*3/uL (ref 0.0–0.1)
Basophils Relative: 0 %
Eosinophils Absolute: 0 10*3/uL (ref 0.0–0.7)
Eosinophils Relative: 0 %
HCT: 38.3 % (ref 36.0–46.0)
Hemoglobin: 12.7 g/dL (ref 12.0–15.0)
Lymphocytes Relative: 7 %
Lymphs Abs: 1.2 10*3/uL (ref 0.7–4.0)
MCH: 30.8 pg (ref 26.0–34.0)
MCHC: 33.2 g/dL (ref 30.0–36.0)
MCV: 92.7 fL (ref 78.0–100.0)
Monocytes Absolute: 0.7 10*3/uL (ref 0.1–1.0)
Monocytes Relative: 4 %
Neutro Abs: 15.9 10*3/uL — ABNORMAL HIGH (ref 1.7–7.7)
Neutrophils Relative %: 89 %
Platelets: 299 10*3/uL (ref 150–400)
RBC: 4.13 MIL/uL (ref 3.87–5.11)
RDW: 14.1 % (ref 11.5–15.5)
WBC: 17.9 10*3/uL — ABNORMAL HIGH (ref 4.0–10.5)

## 2016-06-14 LAB — BASIC METABOLIC PANEL
Anion gap: 10 (ref 5–15)
BUN: 5 mg/dL — ABNORMAL LOW (ref 6–20)
CO2: 23 mmol/L (ref 22–32)
Calcium: 9.5 mg/dL (ref 8.9–10.3)
Chloride: 106 mmol/L (ref 101–111)
Creatinine, Ser: 0.67 mg/dL (ref 0.44–1.00)
GFR calc Af Amer: >60 mL/min (ref >60)
GFR calc non Af Amer: >60 mL/min (ref >60)
Glucose, Bld: 103 mg/dL — ABNORMAL HIGH (ref 65–99)
Potassium: 3.4 mmol/L — ABNORMAL LOW (ref 3.5–5.1)
Sodium: 139 mmol/L (ref 135–145)

## 2016-06-14 MED ORDER — LORATADINE 10 MG PO TABS
10.0000 mg | ORAL_TABLET | Freq: Every day | ORAL | Status: DC
  Administered 2016-06-14 – 2016-06-16 (×3): 10 mg via ORAL
  Filled 2016-06-14 (×3): qty 1

## 2016-06-14 MED ORDER — VITAMIN D 1000 UNITS PO TABS
1000.0000 [IU] | ORAL_TABLET | Freq: Every day | ORAL | Status: DC
  Administered 2016-06-14 – 2016-06-16 (×3): 1000 [IU] via ORAL
  Filled 2016-06-14 (×3): qty 1

## 2016-06-14 MED ORDER — LIDOCAINE HCL 2 % IJ SOLN
10.0000 mL | Freq: Once | INTRAMUSCULAR | Status: AC
  Administered 2016-06-14: 200 mg
  Filled 2016-06-14: qty 20

## 2016-06-14 MED ORDER — MONTELUKAST SODIUM 10 MG PO TABS
10.0000 mg | ORAL_TABLET | Freq: Every day | ORAL | Status: DC
  Administered 2016-06-14 – 2016-06-16 (×3): 10 mg via ORAL
  Filled 2016-06-14 (×3): qty 1

## 2016-06-14 MED ORDER — ENOXAPARIN SODIUM 40 MG/0.4ML ~~LOC~~ SOLN
40.0000 mg | SUBCUTANEOUS | Status: DC
  Administered 2016-06-14 – 2016-06-15 (×2): 40 mg via SUBCUTANEOUS
  Filled 2016-06-14 (×2): qty 0.4

## 2016-06-14 MED ORDER — HYDROMORPHONE HCL 1 MG/ML IJ SOLN
1.0000 mg | Freq: Once | INTRAMUSCULAR | Status: AC
  Administered 2016-06-14: 1 mg via INTRAVENOUS
  Filled 2016-06-14: qty 1

## 2016-06-14 MED ORDER — DEXTROSE 5 % IV SOLN
1.0000 g | INTRAVENOUS | Status: DC
  Administered 2016-06-15 – 2016-06-16 (×2): 1 g via INTRAVENOUS
  Filled 2016-06-14 (×2): qty 10

## 2016-06-14 MED ORDER — SODIUM CHLORIDE 0.9 % IV SOLN
INTRAVENOUS | Status: AC
  Administered 2016-06-14: 17:00:00 via INTRAVENOUS

## 2016-06-14 MED ORDER — MORPHINE SULFATE (PF) 2 MG/ML IV SOLN
2.0000 mg | INTRAVENOUS | Status: DC | PRN
  Administered 2016-06-14 – 2016-06-16 (×4): 2 mg via INTRAVENOUS
  Filled 2016-06-14 (×4): qty 1

## 2016-06-14 MED ORDER — FLUTICASONE PROPIONATE 50 MCG/ACT NA SUSP
1.0000 | Freq: Every day | NASAL | Status: DC
  Administered 2016-06-14 – 2016-06-16 (×3): 1 via NASAL
  Filled 2016-06-14: qty 16

## 2016-06-14 MED ORDER — QUETIAPINE FUMARATE 50 MG PO TABS
150.0000 mg | ORAL_TABLET | Freq: Every day | ORAL | Status: DC
  Administered 2016-06-15: 150 mg via ORAL
  Filled 2016-06-14: qty 1

## 2016-06-14 MED ORDER — POTASSIUM CHLORIDE CRYS ER 20 MEQ PO TBCR
40.0000 meq | EXTENDED_RELEASE_TABLET | Freq: Once | ORAL | Status: AC
  Administered 2016-06-14: 40 meq via ORAL
  Filled 2016-06-14: qty 2

## 2016-06-14 MED ORDER — HYDROXYZINE HCL 25 MG PO TABS: 25.0000 mg | ORAL_TABLET | Freq: Three times a day (TID) | ORAL | Status: DC | PRN

## 2016-06-14 MED ORDER — IPRATROPIUM-ALBUTEROL 0.5-2.5 (3) MG/3ML IN SOLN: 3.0000 mL | RESPIRATORY_TRACT | Status: DC | PRN

## 2016-06-14 MED ORDER — HYDROMORPHONE HCL 1 MG/ML IJ SOLN
1.0000 mg | Freq: Once | INTRAMUSCULAR | Status: AC
Start: 2016-06-14 — End: 2016-06-14
  Administered 2016-06-14: 1 mg via INTRAVENOUS
  Filled 2016-06-14: qty 1

## 2016-06-14 MED ORDER — ONDANSETRON HCL 4 MG/2ML IJ SOLN
4.0000 mg | Freq: Once | INTRAMUSCULAR | Status: AC
  Administered 2016-06-14: 4 mg via INTRAVENOUS
  Filled 2016-06-14: qty 2

## 2016-06-14 MED ORDER — DEXTROSE 5 % IV SOLN
1.0000 g | Freq: Once | INTRAVENOUS | Status: AC
  Administered 2016-06-14: 1 g via INTRAVENOUS
  Filled 2016-06-14: qty 10

## 2016-06-14 MED ORDER — LISINOPRIL 40 MG PO TABS
40.0000 mg | ORAL_TABLET | Freq: Every day | ORAL | Status: DC
  Administered 2016-06-14 – 2016-06-16 (×3): 40 mg via ORAL
  Filled 2016-06-14 (×3): qty 1

## 2016-06-14 MED ORDER — IBUPROFEN 400 MG PO TABS: 400.0000 mg | ORAL_TABLET | Freq: Four times a day (QID) | ORAL | Status: DC | PRN

## 2016-06-14 MED ORDER — SODIUM CHLORIDE 0.9 % IV SOLN
Freq: Once | INTRAVENOUS | Status: AC
  Administered 2016-06-14: 13:00:00 via INTRAVENOUS

## 2016-06-14 MED ORDER — MIRTAZAPINE 45 MG PO TABS
45.0000 mg | ORAL_TABLET | Freq: Every day | ORAL | Status: DC
  Administered 2016-06-15: 45 mg via ORAL
  Filled 2016-06-14 (×3): qty 1

## 2016-06-14 NOTE — ED Notes (Signed)
Patient transported to X-ray 

## 2016-06-14 NOTE — H&P (Signed)
Date: 06/14/2016               Patient Name:  Laurie ChimeRhonda E Hildenbrand MRN: [id number]  DOB: 1971/05/02 Age / Sex: 45 y.o., female   PCP: Carolynn CommentBryan Strelow, MD         Medical Service: Internal Medicine Teaching Service         Attending Physician: Dr. Burns SpainElizabeth A Butcher, MD    First Contact: Dr. Reymundo Pollarolyn Rosbel Buckner Pager: [phone]  Second Contact: Dr. Griffin BasilJennifer Krall  Pager: [phone]       After Hours (After 5p/  First Contact Pager: [phone]  weekends / holidays): Second Contact Pager: [phone]   Chief Complaint: R hand cellulitis   History of Present Illness: Ms. Fredricka BonineConnor is a 45 yo F with a pmhx of HTN, asthma, and Bipolar disorder who presents with pain and swelling in her R hand after cutting her thumb on a knife yesterday afternoon. Patient was opening a can of green beans when she lost her grip and the can fell into the sink of dirty dish water. She reached her hand into the soapy water to retreive the can and when she pulled it out she noticed the cut on her hand. She is unsure if she cut her hand on a knife that was in the sink or from the can. It bled minimally, however overnight it began to throb and when she woke in the morning her hand was extremely painful and swollen. She denied any loss of sensation and fevers at home.   Meds: Current Facility-Administered Medications  Medication Dose Route Frequency Provider Last Rate Last Dose  . 0.9 %  sodium chloride infusion   Intravenous STAT Lonia SkinnerLeslie K MakotiSofia, PA-C 125 mL/hr at 06/14/16 1715    . cholecalciferol (VITAMIN D) tablet 1,000 Units  1,000 Units Oral Daily Alexa Lucrezia Starch Burns, MD      . fluticasone (FLONASE) 50 MCG/ACT nasal spray 1 spray  1 spray Each Nare Daily Alexa Lucrezia Starch Burns, MD      . hydrOXYzine (ATARAX/VISTARIL) tablet 25 mg  25 mg Oral Q8H PRN Alexa Lucrezia Starch Burns, MD      . ibuprofen (ADVIL,MOTRIN) tablet 400 mg  400 mg Oral Q6H PRN Alexa R Burns, MD      . ipratropium-albuterol (DUONEB) 0.5-2.5 (3) MG/3ML nebulizer solution 3 mL  3 mL  Inhalation Q4H PRN Alexa Lucrezia Starch Burns, MD      . lisinopril (PRINIVIL,ZESTRIL) tablet 40 mg  40 mg Oral Daily Alexa Lucrezia Starch Burns, MD      . loratadine (CLARITIN) tablet 10 mg  10 mg Oral Daily Alexa R Burns, MD      . mirtazapine (REMERON) tablet 45 mg  45 mg Oral QHS Alexa R Burns, MD      . montelukast (SINGULAIR) tablet 10 mg  10 mg Oral Daily Alexa R Burns, MD      . potassium chloride SA (K-DUR,KLOR-CON) CR tablet 40 mEq  40 mEq Oral Once Alexa R Burns, MD      . QUEtiapine (SEROQUEL) tablet 150 mg  150 mg Oral QHS Alexa Lucrezia Starch Burns, MD        Allergies: Allergies as of 06/14/2016  . (No Known Allergies)   Past Medical History  Diagnosis Date  . Asthma   . Hypertension   . Mitral valve prolapse   . Bipolar disorder (HCC)     Family History: HTN  Social History: Lives at home with her father and two kids, age 44 and  13. She is on disability due to her bipolar disorder and is seen at mental health. She is a current smoker but has cut back to 1 pack per week, or 2-3 cigarettes a day. Occasional etoh use, 1-2 drinks on the weekends and marijuana use 2 weeks ago.   Review of Systems: A complete ROS was negative except as per HPI.   Physical Exam: Blood pressure 142/91, pulse 90, temperature 99.5 F (37.5 C), temperature source Oral, resp. rate 16, height 5\' 5"  (1.651 m), weight 121 lb (54.885 kg), SpO2 98 %.   Constitutional: NAD  HEENT: Normocephalic, atraumatic Cardiovascular: RRR, no murmurs, rubs, or gallops Respiratory: CTAB, no wheezes, rails, or rhonchi  Gastrointestinal: Soft, non tender, non distended. +BS Extremities: Edema of the R thumb and thenar region of the hand with erythema extending to the upper extremity. Extremely tender to touch. No appreciable fluid collection or crepitus. Neurovascularly intact. Psychiatric: mood and affect normal  Hematologic/Lymphatic: +lymphangitic streaking R upper and lower extremity    Assessment & Plan by Problem:  Moderate, non-purulent  cellulitis of R hand: Significant edema of R hand, tender to palpation, with erythema extending to R lower and upper extremity after cutting thumb in dirty dish water 1 day ago.  S/p I&D in ED and found to be non-purulent. No concern for abscess or necrotizing fascitis at this time. Afebrile, no signs of SIRS or sepsis. However, given extend and rapid spread of infection, admitted for IV antibiotics and close monitoring.  - Continue IV Ceftriaxone; if not improved by am, escalate to vanc and zosyn for MRSA coverage.  - If patient develops fever, consider blood cx  - Hand surgery to see patient per ED attending; follow up on further recs - Pain control: morphine 2mg  q4 prn - Repeat CBC tomorrow   HTN: - Continue home lisinopril  FULL code  Dispo: Admit patient to Inpatient with expected length of stay greater than 2 midnights.  Signed: Reymundo Pollarolyn Carrieanne Kleen, MD 06/14/2016, 5:50 PM  Pager: [phone]

## 2016-06-14 NOTE — ED Notes (Signed)
Karen, PA at the bedside.  

## 2016-06-14 NOTE — ED Provider Notes (Signed)
CSN: [id number]     Arrival date & time 06/14/16  1029 History   First MD Initiated Contact with Patient 06/14/16 1135     No chief complaint on file.    (Consider location/radiation/quality/duration/timing/severity/associated sxs/prior Treatment) Patient is a 45 y.o. female presenting with hand injury. The history is provided by the patient. No language interpreter was used.  Hand Injury Location:  Finger Time since incident:  12 hours Injury: no   Finger location:  R thumb Pain details:    Quality:  Aching   Radiates to:  Does not radiate   Severity:  Moderate   Onset quality:  Gradual   Timing:  Constant   Progression:  Worsening Chronicity:  New Handedness:  Right-handed Dislocation: no   Foreign body present:  No foreign bodies Prior injury to area:  No Relieved by:  Nothing Worsened by:  Nothing tried Ineffective treatments:  None tried Associated symptoms: swelling   Associated symptoms: no numbness   Pt reports she was washing dishes yesterday and something stuck the tip of her finger.  Pt complains of swelling.    Past Medical History  Diagnosis Date  . Asthma   . Hypertension   . Mitral valve prolapse   . Bipolar disorder (HCC)    History reviewed. No pertinent past surgical history. History reviewed. No pertinent family history. Social History  Substance Use Topics  . Smoking status: Current Every Day Smoker -- 0.10 packs/day    Types: Cigarettes  . Smokeless tobacco: None     Comment: Smoking less.  Smokes 3 per day  . Alcohol Use: 0.0 oz/week    0 Standard drinks or equivalent per week     Comment: occassionally   OB History    No data available     Review of Systems  All other systems reviewed and are negative.     Allergies  Review of patient's allergies indicates no known allergies.  Home Medications   Prior to Admission medications   Medication Sig Start Date End Date Taking? Authorizing Provider  albuterol-ipratropium (COMBIVENT)  18-103 MCG/ACT inhaler Inhale 1-2 puffs into the lungs every 4 (four) hours as needed for wheezing or shortness of breath. 03/17/15   Yolanda MangesAlex M Wilson, DO  amoxicillin-clavulanate (AUGMENTIN) 875-125 MG tablet Take 1 tablet by mouth 2 (two) times daily. One po bid x 7 days 02/02/16   Otis BraceMarjan Rabbani, MD  cetirizine (ZYRTEC) 10 MG tablet Take 1 tablet (10 mg total) by mouth daily. 04/20/15   Yolanda MangesAlex M Wilson, DO  hydrOXYzine (ATARAX/VISTARIL) 25 MG tablet Take 25 mg by mouth every 8 (eight) hours as needed for anxiety.    Historical Provider, MD  lisinopril (PRINIVIL,ZESTRIL) 40 MG tablet TAKE 1 TABLET(40 MG) BY MOUTH DAILY 02/02/16   Otis BraceMarjan Rabbani, MD  mirtazapine (REMERON) 45 MG tablet Take 45 mg by mouth at bedtime.    Historical Provider, MD  mometasone (NASONEX) 50 MCG/ACT nasal spray Place 2 sprays into the nose daily.     Historical Provider, MD  montelukast (SINGULAIR) 10 MG tablet Take 1 tablet (10 mg total) by mouth daily. 02/02/16 02/01/17  Otis BraceMarjan Rabbani, MD  Vitamin D, Cholecalciferol, 1000 units TABS Take 1 tablet by mouth daily. 02/02/16   Marjan Rabbani, MD   BP 168/102 mmHg  Pulse 92  Temp(Src) 98.6 F (37 C) (Oral)  Resp 17  Ht 5\' 5"  (1.651 m)  Wt 55.792 kg  BMI 20.47 kg/m2  SpO2 98% Physical Exam  Constitutional: She is oriented to  person, place, and time. She appears well-developed and well-nourished.  Musculoskeletal: She exhibits tenderness.  Swollen right 1st finger,  Pain with range of motion,  nv and ns intact   Neurological: She is alert and oriented to person, place, and time. She has normal reflexes.  Skin: There is erythema.  Psychiatric: She has a normal mood and affect.  Nursing note and vitals reviewed.   ED Course  .Marland Kitchen.Incision and Drainage Date/Time: 06/14/2016 3:55 PM Performed by: Elson AreasSOFIA, Tonatiuh Mallon K Authorized by: Elson AreasSOFIA, Marquesha Robideau K Consent: Verbal consent obtained. Risks and benefits: risks, benefits and alternatives were discussed Consent given by: patient Patient  understanding: patient states understanding of the procedure being performed Patient identity confirmed: verbally with patient Type: abscess Body area: upper extremity Location details: right thumb Anesthesia: local infiltration Local anesthetic: lidocaine 2% without epinephrine Scalpel size: 11 Incision type: single straight Complexity: simple Drainage: purulent Drainage amount: scant Wound treatment: wound left open Packing material: 1/4 in iodoform gauze Patient tolerance: Patient tolerated the procedure well with no immediate complications   (including critical care time) Labs Review Labs Reviewed  CBC WITH DIFFERENTIAL/PLATELET - Abnormal; Notable for the following:    WBC 17.9 (*)    Neutro Abs 15.9 (*)    All other components within normal limits  BASIC METABOLIC PANEL - Abnormal; Notable for the following:    Potassium 3.4 (*)    Glucose, Bld 103 (*)    BUN 5 (*)    All other components within normal limits    Imaging Review Dg Finger Thumb Right  06/14/2016  CLINICAL DATA:  Swelling in the right thumb after knife injury 1 day prior. EXAM: RIGHT THUMB 2+V COMPARISON:  None. FINDINGS: Diffuse soft tissue swelling. No fracture, dislocation or suspicious focal osseous lesion. No cortical erosions or periosteal reaction. No soft tissue gas. No appreciable arthropathy. No radiopaque foreign body. IMPRESSION: Diffuse soft tissue swelling, with no osseous abnormality. No radiopaque foreign body. Electronically Signed   By: Delbert PhenixJason A Poff M.D.   On: 06/14/2016 12:11   I have personally reviewed and evaluated these images and lab results as part of my medical decision-making.   EKG Interpretation None      MDM I spoke to Dr. Janee Mornhompson who advised opening area of wound.  Pt given IV antibiotics.  I will have Internal  Medicine MD admit for antibiotics and further treatment   Final diagnoses:  Cellulitis of right thumb  Cellulitis of right hand    Pt admitted on further  evaluation      Elson AreasLeslie K Travaughn Vue, PA-C 06/14/16 1556  Geoffery Lyonsouglas Delo, MD 06/14/16 1600

## 2016-06-14 NOTE — ED Notes (Signed)
Clydie BraunKaren, PA at the bedside for I and D

## 2016-06-14 NOTE — ED Notes (Signed)
Pt presents with redness and swelling noted to R thumb, hand and wrist.  Pt reports puncturing thumb either on can or knife that had fallen into sink.  Pt reports heat to hand, reports clear drainage after squeezing thumb yesterday.

## 2016-06-15 ENCOUNTER — Encounter (HOSPITAL_COMMUNITY): Payer: Self-pay | Admitting: General Practice

## 2016-06-15 LAB — BASIC METABOLIC PANEL
Anion gap: 8 (ref 5–15)
CO2: 21 mmol/L — ABNORMAL LOW (ref 22–32)
Calcium: 8.9 mg/dL (ref 8.9–10.3)
Chloride: 106 mmol/L (ref 101–111)
Creatinine, Ser: 0.67 mg/dL (ref 0.44–1.00)
GFR calc Af Amer: >60 mL/min (ref >60)
GLUCOSE: 89 mg/dL (ref 65–99)
Potassium: 3.6 mmol/L (ref 3.5–5.1)
Sodium: 135 mmol/L (ref 135–145)

## 2016-06-15 LAB — CBC
HCT: 35.6 % — ABNORMAL LOW (ref 36.0–46.0)
Hemoglobin: 11.8 g/dL — ABNORMAL LOW (ref 12.0–15.0)
MCH: 30.6 pg (ref 26.0–34.0)
MCHC: 33.1 g/dL (ref 30.0–36.0)
MCV: 92.5 fL (ref 78.0–100.0)
Platelets: 249 10*3/uL (ref 150–400)
RBC: 3.85 MIL/uL — ABNORMAL LOW (ref 3.87–5.11)
RDW: 14 % (ref 11.5–15.5)
WBC: 19.8 10*3/uL — ABNORMAL HIGH (ref 4.0–10.5)

## 2016-06-15 MED ORDER — ACETAMINOPHEN 325 MG PO TABS
650.0000 mg | ORAL_TABLET | Freq: Four times a day (QID) | ORAL | Status: DC | PRN
  Administered 2016-06-15 – 2016-06-16 (×2): 650 mg via ORAL
  Filled 2016-06-15 (×2): qty 2

## 2016-06-15 MED ORDER — TETANUS-DIPHTH-ACELL PERTUSSIS 5-2.5-18.5 LF-MCG/0.5 IM SUSP
0.5000 mL | Freq: Once | INTRAMUSCULAR | Status: AC
  Administered 2016-06-15: 0.5 mL via INTRAMUSCULAR
  Filled 2016-06-15: qty 0.5

## 2016-06-15 NOTE — Progress Notes (Signed)
  Date: 06/15/2016  Patient name: [name]  Medical record number: [id number]  Date of birth: 07/27/1971   I have seen and evaluated Laurie Chimehonda E Eisman and discussed their care with the Residency Team. Ms Doylene CanardConner [facility] who presented to ED for a R thumb cut. She cut her hand on the 5th in dirty dishwater either with a knife or on the cut lid of a tin can. Overnight, it throbbed and was extremely painful and swollen and she came to the ED. She has not had any fevers. In the ED, she had an I&D which was L shaped over the thumb pad with scant purulent drainage. It was subsequently packed. Ortho saw pt overnight and rec the packing be removed on the 8th and soaks started after the packing removed. It will heal by secondary intent and she can F/U with them as outpt. This AM,.the pain is controlled on IV PRN opioids. She has stiffness of the R hand.  PMHx, Fam Hx, and/or Soc Hx : Bipolar, HTN, and asthma. Fam Hx is sig for HTN. She is on disability 2/2 her bipolar.  Filed Vitals:   06/15/16 0550 06/15/16 0947  BP: 105/80 131/94  Pulse: 84   Temp: 99.3 F (37.4 C)   Resp: 19    NAD HRRR no MRG LCTAB ABD + BS, soft RUE - erythema slightly less at the most proximal area, thenar swelling, FROM of all joints, good sensation. Thumb incision clean  WBC 19.8 HgB 11.8  Assessment and Plan: I have seen and evaluated the patient as outlined above. I agree with the formulated Assessment and Plan as detailed in the residents' note, with the following changes:   1. R thumb infection with surrounding cellulitis, possibly early felon - she is responding well to IV ABX and opioids along with ED I&D. There is no imminent tissue loss so this course will be continued. Plan for removal of packing and soaking tomorrow. Likely will change to PO ABX and D/C home tomorrow if improvement continues.  Burns SpainElizabeth A Raife Lizer, MD 7/7/20171:45 PM

## 2016-06-15 NOTE — Consult Note (Addendum)
ORTHOPAEDIC CONSULTATION HISTORY & PHYSICAL REQUESTING PHYSICIAN: Burns SpainElizabeth A Butcher, MD  Chief Complaint: Right thumb/hand infection  HPI: [name] is a 45 y.o. female who accidentally stabbed the pulp of the right thumb in the afternoon on Wednesday reaching into dirty dish water.  It was unclear to her whether it was from a knife or the can lid after which she was reaching.  Overnight it became more swollen and painful.  She presented to the emergency room for evaluation, where incision and drainage of presumptive felon was performed and she has been admitted for IV antibiotics.  She reports that already there is been improvement in the ascending lymphangitis in her forearm and arm.  Past Medical History  Diagnosis Date  . Asthma   . Hypertension   . Mitral valve prolapse   . Bipolar disorder (HCC)   . Chronic bronchitis (HCC)   . Anemia   . Cellulitis of right thumb 06/14/2016   Past Surgical History  Procedure Laterality Date  . No past surgeries     Social History   Social History  . Marital Status: Single    Spouse Name: N/A  . Number of Children: N/A  . Years of Education: N/A   Social History Main Topics  . Smoking status: Current Every Day Smoker -- 0.12 packs/day for 25 years    Types: Cigarettes  . Smokeless tobacco: Never Used  . Alcohol Use: 1.2 oz/week    0 Standard drinks or equivalent, 1 Glasses of wine, 1 Cans of beer per week  . Drug Use: Yes    Special: Marijuana     Comment: 06/14/2016 "since I lost my mom in 2015; maybe use it 1-2 times/month"  . Sexual Activity: Not Currently   Other Topics Concern  . None   Social History Narrative   History reviewed. No pertinent family history. No Known Allergies Prior to Admission medications   Medication Sig Start Date End Date Taking? Authorizing Provider  albuterol-ipratropium (COMBIVENT) 18-103 MCG/ACT inhaler Inhale 1-2 puffs into the lungs every 4 (four) hours as needed for wheezing or shortness  of breath. 03/17/15  Yes Yolanda MangesAlex M Wilson, DO  cetirizine (ZYRTEC) 10 MG tablet Take 1 tablet (10 mg total) by mouth daily. 04/20/15  Yes Yolanda MangesAlex M Wilson, DO  hydrOXYzine (ATARAX/VISTARIL) 25 MG tablet Take 25 mg by mouth every 8 (eight) hours as needed for anxiety.   Yes Historical Provider, MD  ibuprofen (ADVIL,MOTRIN) 200 MG tablet Take 400 mg by mouth every 6 (six) hours as needed for mild pain.   Yes Historical Provider, MD  lisinopril (PRINIVIL,ZESTRIL) 40 MG tablet TAKE 1 TABLET(40 MG) BY MOUTH DAILY 02/02/16  Yes Marjan Rabbani, MD  mirtazapine (REMERON) 45 MG tablet Take 45 mg by mouth at bedtime.   Yes Historical Provider, MD  montelukast (SINGULAIR) 10 MG tablet Take 1 tablet (10 mg total) by mouth daily. 02/02/16 02/01/17 Yes Marjan Rabbani, MD  QUEtiapine (SEROQUEL) 50 MG tablet Take 150 mg by mouth at bedtime.   Yes Historical Provider, MD  Vitamin D, Cholecalciferol, 1000 units TABS Take 1 tablet by mouth daily. 02/02/16  Yes Otis BraceMarjan Rabbani, MD  amoxicillin-clavulanate (AUGMENTIN) 875-125 MG tablet Take 1 tablet by mouth 2 (two) times daily. One po bid x 7 days Patient not taking: Reported on 06/14/2016 02/02/16   Otis BraceMarjan Rabbani, MD  mometasone (NASONEX) 50 MCG/ACT nasal spray Place 2 sprays into the nose daily.     Historical Provider, MD   Dg Finger Thumb Right  06/14/2016  CLINICAL DATA:  Swelling in the right thumb after knife injury 1 day prior. EXAM: RIGHT THUMB 2+V COMPARISON:  None. FINDINGS: Diffuse soft tissue swelling. No fracture, dislocation or suspicious focal osseous lesion. No cortical erosions or periosteal reaction. No soft tissue gas. No appreciable arthropathy. No radiopaque foreign body. IMPRESSION: Diffuse soft tissue swelling, with no osseous abnormality. No radiopaque foreign body. Electronically Signed   By: Delbert PhenixJason A Poff M.D.   On: 06/14/2016 12:11    Positive ROS: All other systems have been reviewed and were otherwise negative with the exception of those mentioned in the  HPI and as above.  Physical Exam: Vitals: Refer to EMR. Constitutional:  WD, WN, NAD HEENT:  NCAT, EOMI Neuro/Psych:  Alert & oriented to person, place, and time; appropriate mood & affect Lymphatic: No generalized extremity edema or lymphadenopathy Extremities / MSK:  The extremities are normal with respect to appearance, ranges of motion, joint stability, muscle strength/tone, sensation, & perfusion except as otherwise noted:  Right thumb moderately swollen, no specific fluctuance.  There is redness that extends into the forearm.  There is a packing placed in an incision in the pulp, made longitudinally.  Assessment: Right thumb infection, presumably a developing felon with surrounding cellulitis/lymphangitis.  It is already gone bedside surgical management in the ED by ED staff, with a packing in place  Recommendations:  The packing can be pulled on Saturday.  The patient should begin warm water soaks for about 20 minutes 3 times a day after that in an effort to help ensure that the surgically created wound continues to drain as needed.  I would anticipate that she responds to continued medical management of her infection with appropriate IV antibiotics, switching to orals at the appropriate time, under the direction of her medical team.  The wound itself should close uneventfully through healing via secondary intent.  She may follow-up with me in 7-10 days for evaluation of the wound, the extent of its healing, and for assessment of the need for therapy, etc. from a functional point of view.  Cliffton Astersavid A. Janee Mornhompson, MD      Orthopaedic & Hand Surgery [facility] [address] [place], [place]  [id number] Office: [phone] Mobile: [phone]  06/15/2016, 2:23 AM

## 2016-06-15 NOTE — Progress Notes (Signed)
   Subjective: Doing well this morning, pain controlled on prns. BM this am.   Objective: Vital signs in last 24 hours: Filed Vitals:   06/14/16 1641 06/14/16 2119 06/15/16 0550 06/15/16 0947  BP: 142/91 102/63 105/80 131/94  Pulse: 90 84 84   Temp: 99.5 F (37.5 C) 99.7 F (37.6 C) 99.3 F (37.4 C)   TempSrc: Oral Oral Oral   Resp: 16 17 19    Height:      Weight: 121 lb (54.885 kg)     SpO2: 98% 99% 99%    Physical Exam Constitutional: NAD  HEENT: Normocephalic, atraumatic Cardiovascular: RRR, no murmurs, rubs, or gallops Respiratory: CTAB, no wheezes, rails, or rhonchi  Gastrointestinal: Soft, non tender, non distended. +BS Extremities: Edema of the R thumb and thenar region of the hand with erythema extending to the upper extremity. Extremely tender to touch. No appreciable fluid collection or crepitus. Neurovascularly intact. Psychiatric: mood and affect normal  Hematologic/Lymphatic: +lymphangitic streaking R upper and lower extremity - improving.  Assessment/Plan:  Moderate, non-purulent cellulitis of R hand: Significant edema of R hand, tender to palpation, with erythema extending to R lower and upper extremity after cutting thumb in dirty dish water. S/p I&D in ED and found to be non-purulent. No concern for abscess or necrotizing fascitis at this time. Afebrile, no signs of SIRS or sepsis. However, given extend and rapid spread of infection, admitted for IV antibiotics and close monitoring.  - Continue IV Ceftriaxone D2; lymphangitic streaking appears to be receeding from marked border in upper extremity. No need to escalate abx at this time.  - If patient develops fever, consider blood cx  - Per ortho, the packing can be pulled Saturday and should begin warm water soaks TID at that time. F/u with ortho in 7-10 days.  - Pain control: morphine 2mg  q4 prn - Repeat CBC tomorrow   HTN: - Continue home lisinopril  FULL code Dispo: Anticipated discharge in  approximately 1-2 day(s).   LOS: 1 day   Reymundo Pollarolyn Shaden Lacher, MD 06/15/2016, 12:40 PM Pager: [phone]

## 2016-06-15 NOTE — Discharge Instructions (Signed)
After pulling the packing on Saturday, begin warm water soaks for 20 minutes 3 times a day for a couple of days to help prevent the open wound from sealing too promptly. Cover with a fresh dressing daily until there is no longer any active drainage from the wound. Work to fully bend and straighten your thumb to prevent it from getting stiff.

## 2016-06-16 LAB — CBC
HCT: 36.1 % (ref 36.0–46.0)
Hemoglobin: 11.8 g/dL — ABNORMAL LOW (ref 12.0–15.0)
MCH: 30.5 pg (ref 26.0–34.0)
MCHC: 32.7 g/dL (ref 30.0–36.0)
MCV: 93.3 fL (ref 78.0–100.0)
PLATELETS: 234 10*3/uL (ref 150–400)
RBC: 3.87 MIL/uL (ref 3.87–5.11)
RDW: 13.9 % (ref 11.5–15.5)
WBC: 14.3 10*3/uL — AB (ref 4.0–10.5)

## 2016-06-16 MED ORDER — IBUPROFEN 600 MG PO TABS: 600.0000 mg | ORAL_TABLET | Freq: Four times a day (QID) | ORAL | Status: DC | PRN

## 2016-06-16 MED ORDER — ACETAMINOPHEN 325 MG PO TABS: 650.0000 mg | ORAL_TABLET | Freq: Four times a day (QID) | ORAL | Status: DC | PRN

## 2016-06-16 MED ORDER — CEPHALEXIN 500 MG PO CAPS: 500.0000 mg | ORAL_CAPSULE | Freq: Four times a day (QID) | ORAL | Status: DC

## 2016-06-16 NOTE — Discharge Summary (Signed)
Name: [name] MRN: [id number] DOB: February 21, 1971 45 y.o. PCP: Carolynn Comment, MD  Date of Admission: 06/14/2016 11:26 AM Date of Discharge: 06/16/2016 Attending Physician: Burns Spain, MD  Discharge Diagnosis: Principal Problem:   Cellulitis of right hand Active Problems:   Anxiety disorder   Hypertension   Chronic allergic rhinitis   Bipolar disorder (HCC)   Discharge Medications:   Medication List    STOP taking these medications        amoxicillin-clavulanate 875-125 MG tablet  Commonly known as:  AUGMENTIN      TAKE these medications        albuterol-ipratropium 18-103 MCG/ACT inhaler  Commonly known as:  COMBIVENT  Inhale 1-2 puffs into the lungs every 4 (four) hours as needed for wheezing or shortness of breath.     cephALEXin 500 MG capsule  Commonly known as:  KEFLEX  Take 1 capsule (500 mg total) by mouth every 6 (six) hours.     cetirizine 10 MG tablet  Commonly known as:  ZYRTEC  Take 1 tablet (10 mg total) by mouth daily.     hydrOXYzine 25 MG tablet  Commonly known as:  ATARAX/VISTARIL  Take 25 mg by mouth every 8 (eight) hours as needed for anxiety.     ibuprofen 200 MG tablet  Commonly known as:  ADVIL,MOTRIN  Take 400 mg by mouth every 6 (six) hours as needed for mild pain.     lisinopril 40 MG tablet  Commonly known as:  PRINIVIL,ZESTRIL  TAKE 1 TABLET(40 MG) BY MOUTH DAILY     mirtazapine 45 MG tablet  Commonly known as:  REMERON  Take 45 mg by mouth at bedtime.     mometasone 50 MCG/ACT nasal spray  Commonly known as:  NASONEX  Place 2 sprays into the nose daily.     montelukast 10 MG tablet  Commonly known as:  SINGULAIR  Take 1 tablet (10 mg total) by mouth daily.     QUEtiapine 50 MG tablet  Commonly known as:  SEROQUEL  Take 150 mg by mouth at bedtime.     Vitamin D (Cholecalciferol) 1000 units Tabs  Take 1 tablet by mouth daily.        Disposition and follow-up:   [name] was discharged from  [facility] in Stable condition.  At the hospital follow up visit please address:  1.  Cellulitis: F/u completion of antibiotic course as well as resolution of erythema.  2.  Labs / imaging needed at time of follow-up: None  3.  Pending labs/ test needing follow-up: BCx  Follow-up Appointments: Follow-up Information    Schedule an appointment as soon as possible for a visit with Janee Morn, DAVID A., MD.   Specialty:  Orthopedic Surgery   Why:  for 7-10 days following discharge from the hospital   Contact information:   [address] [place] [address] [phone]       Schedule an appointment as soon as possible for a visit with Adria Devon, MD.   Specialty:  Internal Medicine   Why:  in 1-2 weeks for hospital follow up   Contact information:   [address] [place] [address] [phone]       Hospital Course by problem list:   1. Cellulitis: Right thumb swelling and pain after cutting her finer on a knife the day prior. She underwent I%D in the ED and was found to be nonpurulent. Given rapid extension and spread of  infection, she was admitted for IV antibiotics. She was started on Ceftriaxone. She was seen in consultation by hand surgery. She had a fever to 101.5 on the night of 7/7 which resolved. Initial blood cultures have been negative. Her leukocytosis downtrended. She was transitioned to PO Keflex to complete a 5 day course of antibiotics. She was given Tdap vaccine. The packing from her wound was removed on 7/8. She will continue warm water soaks TID and was given instructions for outpatient follow up with the internal medicine clinic as well as hand surgery.  Discharge Vitals:   BP 116/72 mmHg  Pulse 86  Temp(Src) 99.3 F (37.4 C) (Oral)  Resp 18  Ht 5\' 5"  (1.651 m)  Wt 121 lb (54.885 kg)  BMI 20.14 kg/m2  SpO2 100%  Pertinent Labs, Studies, and Procedures:  CBC Latest Ref Rng 06/16/2016 06/15/2016 06/14/2016  WBC 4.0 - 10.5 K/uL 14.3(H)  19.8(H) 17.9(H)  Hemoglobin 12.0 - 15.0 g/dL 11.8(L) 11.8(L) 12.7  Hematocrit 36.0 - 46.0 % 36.1 35.6(L) 38.3  Platelets 150 - 400 K/uL 234 249 299    Discharge Instructions: Discharge Instructions    Call MD for:  difficulty breathing, headache or visual disturbances    Complete by:  As directed      Call MD for:  persistant dizziness or light-headedness    Complete by:  As directed      Call MD for:  persistant nausea and vomiting    Complete by:  As directed      Call MD for:  redness, tenderness, or signs of infection (pain, swelling, redness, odor or green/yellow discharge around incision site)    Complete by:  As directed      Call MD for:  temperature >100.4    Complete by:  As directed      Discharge instructions    Complete by:  As directed   Take your antibiotic (Keflex/cephalexin) four times a day until you complete the antibiotics. Please schedule an appointment to follow up with your primary care physician in 1-2 weeks. Please follow up with the hand surgeon. If you have any concerns at home please call the clinic [phone] or after hours you can call the hospital operator [phone] and ask for the internal medicine resident on call.     Discharge wound care:    Complete by:  As directed   Soak your hand three times a day in warm water. Pat dry. Cover with a dry gauze dressing. Change dressing if the gauze gets dirty.     Increase activity slowly    Complete by:  As directed            Signed: Lora PaulaJennifer T Florena Kozma, MD 06/16/2016, 4:09 PM   Pager: [phone]

## 2016-06-16 NOTE — Progress Notes (Signed)
Discharge instructions gone over with patient. Home medications discussed. Prescription to pick up at pharmacy. Follow up appointments to be made. Patient encouraged to complete warm soaks to thumb three times a day and to complete antibiotics. Signs and symptoms and or worsening conditions discussed with patient and reasons to call the doctor. Patient verbalized understanding of instructions.

## 2016-06-16 NOTE — Progress Notes (Signed)
   Subjective: Feeling better overall. Feels that her arm is improving. No subjective fevers. No chills.  Objective: Vital signs in last 24 hours: Filed Vitals:   06/15/16 0947 06/15/16 1540 06/15/16 2059 06/16/16 0543  BP: 131/94 124/71 109/67 102/81  Pulse:  79 79 94  Temp:  101.1 F (38.4 C) 100.8 F (38.2 C) 101.5 F (38.6 C)  TempSrc:  Oral Oral Oral  Resp:  19 18 17   Height:      Weight:      SpO2:  99% 99% 99%   Physical Exam General Apperance: NAD HEENT: Normocephalic, atraumatic, anicteric sclera Neck: Supple, trachea midline Lungs: Clear to auscultation bilaterally. No wheezes, rhonchi or rales. Breathing comfortably Heart: Regular rate and rhythm, no murmur/rub/gallop Abdomen: Soft, nontender, nondistended, no rebound/guarding Extremities: Warm and well perfused, no edema Skin: R thumb with incision, surrounding erythema, no purulent drainage Neurologic: Alert and interactive. No gross deficits.  Assessment/Plan: 45 year old woman with HTN, asthma, and Bipolar disorder who presented with pain and swelling in her R hand after cutting her thumb on a knife the day prior.  R thumb cellulitis, possibly early felon: s/p I&D in ED on 7/6. Fever to 101.5 this morning. Last temp 98.9. Remains hemodynamically stable. Leukocytosis down to 14.3 from 19.8 on admission. -Continue IV ceftriaxone. Consider transition to PO abx this afternoon if continues to stay afebrile. -Packing removed. Warm water soaks TID. -Tdap given yesterday  HTN: Continue home lisinopril 40mg  daily Vitamin D deficiency: Continue home vitamin D 1000u daily Allergic rhinitis: Continue home fluticasone 1 spray daily, Claritin daily Asthma: Duoneb prn, continue home montelukast daily Bipolar, Anxiety: Continue home hydroxyzine TID prn, Remeron daily, Seroquel daily  FEN: Regular diet VTE ppx: Lovenox  Dispo: Possibly home today if remains afebrile.   LOS: 2 days   Lora PaulaJennifer T Jacobs Golab, MD 06/16/2016,  8:42 AM Pager: [phone]

## 2016-06-20 LAB — CULTURE, BLOOD (ROUTINE X 2)
Culture: NO GROWTH
Culture: NO GROWTH

## 2016-06-28 ENCOUNTER — Ambulatory Visit (INDEPENDENT_AMBULATORY_CARE_PROVIDER_SITE_OTHER): Payer: Medicaid Other | Admitting: Internal Medicine

## 2016-06-28 ENCOUNTER — Encounter: Payer: Self-pay | Admitting: Internal Medicine

## 2016-06-28 VITALS — BP 126/76 | HR 71 | Temp 98.2°F | Ht 65.0 in | Wt 116.8 lb

## 2016-06-28 DIAGNOSIS — J309 Allergic rhinitis, unspecified: Secondary | ICD-10-CM | POA: Diagnosis not present

## 2016-06-28 DIAGNOSIS — L03113 Cellulitis of right upper limb: Secondary | ICD-10-CM | POA: Diagnosis present

## 2016-06-28 DIAGNOSIS — I10 Essential (primary) hypertension: Secondary | ICD-10-CM

## 2016-06-28 DIAGNOSIS — E559 Vitamin D deficiency, unspecified: Secondary | ICD-10-CM | POA: Diagnosis not present

## 2016-06-28 MED ORDER — LISINOPRIL 40 MG PO TABS: ORAL_TABLET | ORAL | Status: DC

## 2016-06-28 MED ORDER — IBUPROFEN 200 MG PO TABS: 400.0000 mg | ORAL_TABLET | Freq: Four times a day (QID) | ORAL | Status: DC | PRN

## 2016-06-28 MED ORDER — CETIRIZINE HCL 10 MG PO TABS: 10.0000 mg | ORAL_TABLET | Freq: Every day | ORAL | Status: DC

## 2016-06-28 MED ORDER — VITAMIN D (CHOLECALCIFEROL) 25 MCG (1000 UT) PO TABS: 1.0000 | ORAL_TABLET | Freq: Every day | ORAL | Status: DC

## 2016-06-28 MED ORDER — MONTELUKAST SODIUM 10 MG PO TABS: 10.0000 mg | ORAL_TABLET | Freq: Every day | ORAL | Status: DC

## 2016-06-28 NOTE — Patient Instructions (Signed)
The injury on her hand looks to be healing well. Continue to take care to avoid infection by keeping this area clean. Please call us if you notice changes to this area such as warmth redness swelling or increased pain, as this may be a sign that the infection is returned.  We will have you follow-up with your primary care provider to address her other chronic medical conditions.

## 2016-06-28 NOTE — Progress Notes (Signed)
   CC: Follow-up of cellulitis of the right hand HPI: Ms. [name] is a 45 y.o. female with a h/o of hypertension, asthma, bipolar disorder, vitamin D deficiency who presents for follow-up of right hand laceration and cellulitis 2 weeks ago.  Please see Problem-based charting for HPI.  Past Medical History  Diagnosis Date  . Asthma   . Hypertension   . Mitral valve prolapse   . Bipolar disorder (HCC)   . Chronic bronchitis (HCC)   . Anemia   . Cellulitis of right thumb 06/14/2016   Current Outpatient Rx  Name  Route  Sig  Dispense  Refill  . albuterol-ipratropium (COMBIVENT) 18-103 MCG/ACT inhaler   Inhalation   Inhale 1-2 puffs into the lungs every 4 (four) hours as needed for wheezing or shortness of breath.   1 Inhaler   6   . cetirizine (ZYRTEC) 10 MG tablet   Oral   Take 1 tablet (10 mg total) by mouth daily.   30 tablet   3   . hydrOXYzine (ATARAX/VISTARIL) 25 MG tablet   Oral   Take 25 mg by mouth every 8 (eight) hours as needed for anxiety.         Marland Kitchen. ibuprofen (ADVIL,MOTRIN) 200 MG tablet   Oral   Take 400 mg by mouth every 6 (six) hours as needed for mild pain.         Marland Kitchen. lisinopril (PRINIVIL,ZESTRIL) 40 MG tablet      TAKE 1 TABLET(40 MG) BY MOUTH DAILY   90 tablet   3   . mirtazapine (REMERON) 45 MG tablet   Oral   Take 45 mg by mouth at bedtime.         . mometasone (NASONEX) 50 MCG/ACT nasal spray   Nasal   Place 2 sprays into the nose daily.          . montelukast (SINGULAIR) 10 MG tablet   Oral   Take 1 tablet (10 mg total) by mouth daily.   30 tablet   2   . QUEtiapine (SEROQUEL) 50 MG tablet   Oral   Take 150 mg by mouth at bedtime.         . Vitamin D, Cholecalciferol, 1000 units TABS   Oral   Take 1 tablet by mouth daily.   90 tablet   2    Review of Systems: ROS in HPI. Otherwise, she denies CP, SOB, fevers/chills, changes to BM, urinary changes.  Physical Exam: Filed Vitals:   06/28/16 1107  BP: 126/76    Pulse: 71  Temp: 98.2 F (36.8 C)  TempSrc: Oral  Height: 5\' 5"  (1.651 m)  Weight: 116 lb 12.8 oz (52.98 kg)  SpO2: 100%   General appearance: alert, cooperative, appears stated age and no distress Head: Normocephalic, without obvious abnormality, atraumatic Lungs: clear to auscultation bilaterally Heart: regular rate and rhythm, S1, S2 normal, no murmur, click, rub or gallop Extremities: extremities normal, atraumatic, no cyanosis or edema and well healing laceration of the right thumb, mild TTP at the site of injury and good ROM of thumb and hand.  Assessment & Plan:  See encounters tab for problem based medical decision making. Patient seen with Dr. Criselda PeachesMullen  Signed: Carolynn CommentBryan Nikea Settle, MD 06/28/2016, 11:08 AM  Pager: [phone]

## 2016-06-28 NOTE — Assessment & Plan Note (Addendum)
Patient had an accident roughly 2 weeks ago in which she lacerated her right thumb with a knife. She presented to the emergency department shortly after this injury with signs of infection. I&D was performed at that time and was found to be nonpurulent but the spread of cellulitic changes on her right hand were significant enough that warranted IV antibiotics. She was admitted with consultation of orthopedic surgery. After completing a short course of IV antibiotic she was discharged with a 5 day course of by mouth antibiotics. She has since finished this antibiotic course. She followed up with orthopedic hand surgeon last week, who reported that things were healing well.  Today, she has no complaints. Her hand is not swollen, nonerythematous, and only mildly tender at the site of the injury. She has been working on range of motion exercises in order to prevent stiffness of the thumb. She denies fevers or chills.  She has a follow-up appointment with orthopedic surgeon in one month. She can follow with me as needed and will schedule routine health maintenance visit for 6 months.

## 2016-07-03 NOTE — Progress Notes (Signed)
Internal Medicine Clinic Attending  I saw and evaluated the patient.  I personally confirmed the key portions of the history and exam documented by Dr. Strelow and I reviewed pertinent patient test results.  The assessment, diagnosis, and plan were formulated together and I agree with the documentation in the resident's note. 

## 2017-02-14 ENCOUNTER — Other Ambulatory Visit: Payer: Self-pay | Admitting: Internal Medicine

## 2017-02-14 DIAGNOSIS — Z1231 Encounter for screening mammogram for malignant neoplasm of breast: Secondary | ICD-10-CM

## 2017-03-28 ENCOUNTER — Ambulatory Visit (INDEPENDENT_AMBULATORY_CARE_PROVIDER_SITE_OTHER): Payer: Medicaid Other | Admitting: Internal Medicine

## 2017-03-28 ENCOUNTER — Encounter (INDEPENDENT_AMBULATORY_CARE_PROVIDER_SITE_OTHER): Payer: Self-pay

## 2017-03-28 VITALS — BP 128/87 | HR 59 | Temp 98.6°F | Wt 115.7 lb

## 2017-03-28 DIAGNOSIS — F319 Bipolar disorder, unspecified: Secondary | ICD-10-CM

## 2017-03-28 DIAGNOSIS — E559 Vitamin D deficiency, unspecified: Secondary | ICD-10-CM

## 2017-03-28 DIAGNOSIS — J302 Other seasonal allergic rhinitis: Secondary | ICD-10-CM | POA: Diagnosis not present

## 2017-03-28 DIAGNOSIS — J4520 Mild intermittent asthma, uncomplicated: Secondary | ICD-10-CM | POA: Diagnosis not present

## 2017-03-28 DIAGNOSIS — Z79899 Other long term (current) drug therapy: Secondary | ICD-10-CM

## 2017-03-28 DIAGNOSIS — Z0000 Encounter for general adult medical examination without abnormal findings: Secondary | ICD-10-CM

## 2017-03-28 DIAGNOSIS — J309 Allergic rhinitis, unspecified: Secondary | ICD-10-CM

## 2017-03-28 DIAGNOSIS — F314 Bipolar disorder, current episode depressed, severe, without psychotic features: Secondary | ICD-10-CM

## 2017-03-28 DIAGNOSIS — I10 Essential (primary) hypertension: Secondary | ICD-10-CM | POA: Diagnosis present

## 2017-03-28 DIAGNOSIS — F17210 Nicotine dependence, cigarettes, uncomplicated: Secondary | ICD-10-CM

## 2017-03-28 MED ORDER — ALBUTEROL SULFATE HFA 108 (90 BASE) MCG/ACT IN AERS: 2.0000 | INHALATION_SPRAY | Freq: Four times a day (QID) | RESPIRATORY_TRACT | 2 refills | Status: DC | PRN

## 2017-03-28 MED ORDER — ALBUTEROL SULFATE HFA 108 (90 BASE) MCG/ACT IN AERS: 2.0000 | INHALATION_SPRAY | Freq: Once | RESPIRATORY_TRACT | Status: DC

## 2017-03-28 NOTE — Assessment & Plan Note (Signed)
Patient is a history of chronic allergic rhinitis which is worse in the spring and has had one or 2 flares of this in the recent weeks. She takes Singulair and Claritin daily. She requests advice on over-the-counter allergy drops for itchy eyes. I recommended Zaditor or artificial tears.  Assessment: Stable chronic allergic rhinitis  Plan: Continue Singulair, Claritin, Zaditor eyedrops as needed.

## 2017-03-28 NOTE — Assessment & Plan Note (Signed)
Patient is a history of well-controlled asthma currently taking Singulair for allergy control and Claritin for allergy control as these are her primary asthma triggers. She reports only having 1-2 exacerbations requiring albuterol in the last 3 months. She had a prescription for Combivent which she was using for her albuterol needs, however this prescription is expired. I will resend a prescription for albuterol inhaler today to use as needed.  Assessment: Well-controlled intermittent mild asthma.  Plan: Continue albuterol as needed, continue Singulair and Claritin for prophylaxis

## 2017-03-28 NOTE — Assessment & Plan Note (Signed)
Patient has a mammogram upcoming for her routine breast cancer screening on May 21. Patient will be due for her Pap smear in one year. She is up-to-date on her vaccinations.

## 2017-03-28 NOTE — Assessment & Plan Note (Signed)
Patient is well-controlled bipolar disorder on several psychiatric medications which are prescribed to her by her regular psychiatrist. She follows with psychiatry every 6 weeks. She has had no complaints of exacerbations in her mental health complaints. She denies any manic episodes or feeling depressed today.  Assessment: Well-controlled bipolar disorder  Plan: Continue to follow up with psychiatry, continue Quetiapine 50 mg daily mirtazapine 45 mg daily, hydroxyzine 25 mg when necessary

## 2017-03-28 NOTE — Patient Instructions (Signed)
I am glad to hear that you are doing well. I have sent a prescription for albuterol refill to your pharmacy. If you begin to have trouble with your allergies during the spring pollen season, please do not hesitate to call our clinic. I would continue to take the Claritin as needed for allergy symptoms and take the Singulair every day to help prevent asthma exacerbations.  Your blood pressure is well-controlled please continue to take your lisinopril.  We will plan to do a Pap Smear at your next visit in 1 year, otherwise you are up-to-date on all of your preventative health recommendations.

## 2017-03-28 NOTE — Assessment & Plan Note (Signed)
Patient is currently taking lisinopril 40 mg daily for hypertension. Her blood pressure is well-controlled today at less than 130/90.  Assessment: Well-controlled hypertension  Plan: Continue lisinopril 40 mg daily.

## 2017-03-28 NOTE — Assessment & Plan Note (Signed)
Patient is a history of vitamin D deficiency, currently on vitamin D over-the-counter supplements 400 units daily. After sufficient to meet her needs and she was only mildly deficient. She is currently asymptomatic and denies any myalgias today.  Plan: Continue over-the-counter vitamin D supplements 400 units daily.

## 2017-03-28 NOTE — Progress Notes (Signed)
CC: HTN f/u HPI: Ms. [name] is a 46 y.o. female with a h/o of hypertension, asthma, bipolar disorder, vitamin D deficiency  who presents for routine f/u.  Please see Problem-based charting for HPI and the status of patient's chronic medical conditions.  Past Medical History:  Diagnosis Date  . Anemia   . Asthma   . Bipolar disorder (HCC)   . Cellulitis of right thumb 06/14/2016  . Chronic bronchitis (HCC)   . Hypertension   . Mitral valve prolapse    Social History  Substance Use Topics  . Smoking status: Current Every Day Smoker    Packs/day: 0.10    Years: 25.00    Types: Cigarettes  . Smokeless tobacco: Never Used     Comment: 2 per day   . Alcohol use 1.2 oz/week    1 Glasses of wine, 1 Cans of beer per week   No family history on file. Review of Systems: ROS in HPI. Otherwise: Review of Systems  Constitutional: Negative for chills, fever and weight loss.  Respiratory: Negative for cough and shortness of breath.   Cardiovascular: Negative for chest pain and leg swelling.  Gastrointestinal: Negative for abdominal pain, constipation, diarrhea, nausea and vomiting.  Genitourinary: Negative for dysuria, frequency and urgency.   Physical Exam: Vitals:   03/28/17 1403  BP: 128/87  Pulse: (!) 59  Temp: 98.6 F (37 C)  TempSrc: Oral  SpO2: 100%  Weight: 115 lb 11.2 oz (52.5 kg)   Physical Exam  Constitutional: She appears well-developed. She is cooperative. No distress.  Cardiovascular: Normal rate, regular rhythm, normal heart sounds and normal pulses.  Exam reveals no gallop.   No murmur heard. Pulmonary/Chest: Effort normal and breath sounds normal. No respiratory distress. She has no wheezes. She has no rhonchi. She has no rales. Breasts are symmetrical.  Abdominal: Soft. Bowel sounds are normal. There is no tenderness.  Musculoskeletal: She exhibits no edema.    Assessment & Plan:  See encounters tab for problem based medical decision  making. Patient discussed with Dr. Cyndie Chime  Vitamin D insufficiency Patient is a history of vitamin D deficiency, currently on vitamin D over-the-counter supplements 400 units daily. After sufficient to meet her needs and she was only mildly deficient. She is currently asymptomatic and denies any myalgias today.  Plan: Continue over-the-counter vitamin D supplements 400 units daily.  Asthma in adult Patient is a history of well-controlled asthma currently taking Singulair for allergy control and Claritin for allergy control as these are her primary asthma triggers. She reports only having 1-2 exacerbations requiring albuterol in the last 3 months. She had a prescription for Combivent which she was using for her albuterol needs, however this prescription is expired. I will resend a prescription for albuterol inhaler today to use as needed.  Assessment: Well-controlled intermittent mild asthma.  Plan: Continue albuterol as needed, continue Singulair and Claritin for prophylaxis  Healthcare maintenance Patient has a mammogram upcoming for her routine breast cancer screening on May 21. Patient will be due for her Pap smear in one year. She is up-to-date on her vaccinations.  Hypertension Patient is currently taking lisinopril 40 mg daily for hypertension. Her blood pressure is well-controlled today at less than 130/90.  Assessment: Well-controlled hypertension  Plan: Continue lisinopril 40 mg daily.  Chronic allergic rhinitis Patient is a history of chronic allergic rhinitis which is worse in the spring and has had one or 2 flares of this in the recent weeks. She takes Singulair  and Claritin daily. She requests advice on over-the-counter allergy drops for itchy eyes. I recommended Zaditor or artificial tears.  Assessment: Stable chronic allergic rhinitis  Plan: Continue Singulair, Claritin, Zaditor eyedrops as needed.  Bipolar disorder (HCC) Patient is well-controlled bipolar  disorder on several psychiatric medications which are prescribed to her by her regular psychiatrist. She follows with psychiatry every 6 weeks. She has had no complaints of exacerbations in her mental health complaints. She denies any manic episodes or feeling depressed today.  Assessment: Well-controlled bipolar disorder  Plan: Continue to follow up with psychiatry, continue Quetiapine 50 mg daily mirtazapine 45 mg daily, hydroxyzine 25 mg when necessary   Signed: Carolynn Comment, MD 03/28/2017, 2:41 PM  Pager: [phone]

## 2017-03-28 NOTE — Progress Notes (Signed)
Medicine attending: Medical history, presenting problems, physical findings, and medications, reviewed with resident physician Dr Bryan Strelow on the day of the patient visit and I concur with his evaluation and management plan. 

## 2017-04-29 ENCOUNTER — Ambulatory Visit: Payer: Medicaid Other

## 2017-05-03 ENCOUNTER — Ambulatory Visit: Payer: Medicaid Other

## 2017-05-17 ENCOUNTER — Encounter: Payer: Self-pay | Admitting: *Deleted

## 2017-05-30 ENCOUNTER — Ambulatory Visit: Payer: Medicaid Other

## 2017-06-04 ENCOUNTER — Ambulatory Visit
Admission: RE | Admit: 2017-06-04 | Discharge: 2017-06-04 | Disposition: A | Discharge status: Home / Self Care | Payer: Medicaid Other | Source: Ambulatory Visit | Attending: Internal Medicine | Admitting: Internal Medicine

## 2017-06-04 DIAGNOSIS — Z1231 Encounter for screening mammogram for malignant neoplasm of breast: Secondary | ICD-10-CM | POA: Diagnosis not present

## 2018-01-30 ENCOUNTER — Ambulatory Visit (INDEPENDENT_AMBULATORY_CARE_PROVIDER_SITE_OTHER): Payer: Medicaid Other | Admitting: Internal Medicine

## 2018-01-30 ENCOUNTER — Encounter (INDEPENDENT_AMBULATORY_CARE_PROVIDER_SITE_OTHER): Payer: Self-pay

## 2018-01-30 ENCOUNTER — Encounter: Payer: Self-pay | Admitting: Internal Medicine

## 2018-01-30 ENCOUNTER — Other Ambulatory Visit: Payer: Self-pay

## 2018-01-30 VITALS — BP 140/98 | HR 110 | Temp 98.1°F | Ht 64.4 in | Wt 106.4 lb

## 2018-01-30 DIAGNOSIS — E559 Vitamin D deficiency, unspecified: Secondary | ICD-10-CM | POA: Diagnosis not present

## 2018-01-30 DIAGNOSIS — Z79899 Other long term (current) drug therapy: Secondary | ICD-10-CM | POA: Diagnosis not present

## 2018-01-30 DIAGNOSIS — J45909 Unspecified asthma, uncomplicated: Secondary | ICD-10-CM | POA: Diagnosis not present

## 2018-01-30 DIAGNOSIS — F17210 Nicotine dependence, cigarettes, uncomplicated: Secondary | ICD-10-CM | POA: Diagnosis not present

## 2018-01-30 DIAGNOSIS — J4520 Mild intermittent asthma, uncomplicated: Secondary | ICD-10-CM

## 2018-01-30 DIAGNOSIS — F314 Bipolar disorder, current episode depressed, severe, without psychotic features: Secondary | ICD-10-CM

## 2018-01-30 DIAGNOSIS — J309 Allergic rhinitis, unspecified: Secondary | ICD-10-CM

## 2018-01-30 DIAGNOSIS — F319 Bipolar disorder, unspecified: Secondary | ICD-10-CM | POA: Diagnosis not present

## 2018-01-30 DIAGNOSIS — I10 Essential (primary) hypertension: Secondary | ICD-10-CM | POA: Diagnosis present

## 2018-01-30 DIAGNOSIS — Z720 Tobacco use: Secondary | ICD-10-CM

## 2018-01-30 MED ORDER — AMLODIPINE BESYLATE 5 MG PO TABS: 5.0000 mg | ORAL_TABLET | Freq: Every day | ORAL | 2 refills | Status: DC

## 2018-01-30 MED ORDER — ALBUTEROL SULFATE HFA 108 (90 BASE) MCG/ACT IN AERS: 2.0000 | INHALATION_SPRAY | Freq: Four times a day (QID) | RESPIRATORY_TRACT | 2 refills | Status: DC | PRN

## 2018-01-30 MED ORDER — QUETIAPINE FUMARATE 50 MG PO TABS: 150.0000 mg | ORAL_TABLET | Freq: Every day | ORAL | 2 refills | Status: DC

## 2018-01-30 MED ORDER — CETIRIZINE HCL 10 MG PO TABS
10.0000 mg | ORAL_TABLET | Freq: Every day | ORAL | 3 refills | Status: DC
Start: 2018-01-30 — End: 2019-03-05

## 2018-01-30 MED ORDER — MONTELUKAST SODIUM 10 MG PO TABS: 10.0000 mg | ORAL_TABLET | Freq: Every day | ORAL | 2 refills | Status: DC

## 2018-01-30 MED ORDER — HYDROXYZINE HCL 25 MG PO TABS: 25.0000 mg | ORAL_TABLET | Freq: Three times a day (TID) | ORAL | 2 refills | Status: DC | PRN

## 2018-01-30 MED ORDER — MIRTAZAPINE 45 MG PO TABS: 45.0000 mg | ORAL_TABLET | Freq: Every day | ORAL | 2 refills | Status: DC

## 2018-01-30 MED ORDER — NICOTINE POLACRILEX 4 MG MT GUM: 4.0000 mg | CHEWING_GUM | OROMUCOSAL | 0 refills | Status: DC | PRN

## 2018-01-30 MED ORDER — LISINOPRIL 40 MG PO TABS: ORAL_TABLET | ORAL | 0 refills | Status: DC

## 2018-01-30 MED ORDER — VITAMIN D (CHOLECALCIFEROL) 25 MCG (1000 UT) PO TABS: 1.0000 | ORAL_TABLET | Freq: Every day | ORAL | 2 refills | Status: DC

## 2018-01-30 MED ORDER — MOMETASONE FUROATE 50 MCG/ACT NA SUSP: 2.0000 | Freq: Every day | NASAL | 0 refills | Status: DC

## 2018-01-30 NOTE — Progress Notes (Signed)
   CC: Follow-up of her HTN and Asthma   HPI:  [name] is a 47 y.o. with HTN, Asthma, and Bipolar disorder who presented to the clinic for continued evaluation and management of her chronic illnesses. For a detailed assessment and plan please refer to the problem based charting below.   Past Medical History:  Diagnosis Date  . Anemia   . Asthma   . Bipolar disorder (HCC)   . Cellulitis of right thumb 06/14/2016  . Chronic bronchitis (HCC)   . Hypertension   . Mitral valve prolapse    Review of Systems:   12 point ROS preformed. All negative aside from those mentioned in the HPI.   Physical Exam: Vitals:   01/30/18 1317  BP: (!) 147/98  Pulse: (!) 110  Temp: 98.1 F (36.7 C)  TempSrc: Oral  Weight: 106 lb 6.4 oz (48.3 kg)   General: Well nourished female in no acute distress HENT: Normocephalic, atraumatic, moist mucus membranes  Pulm: Good air movement with no wheezing or crackles  CV: RRR, no mumurs, no rubs  Abdomen: Active bowel sounds, soft, non-distended, no tenderness to palpation  Extremities: Pulses palpable in all extremities, no LE edema  Skin: Warm and dry  Neuro: Alert and oriented x 3  Assessment & Plan:   See Encounters Tab for problem based charting.  Patient discussed with Dr. Heide SparkNarendra

## 2018-01-30 NOTE — Assessment & Plan Note (Signed)
Refilled the patient's vitamin D

## 2018-01-30 NOTE — Assessment & Plan Note (Signed)
Refilled the patient's zyrtec.

## 2018-01-30 NOTE — Patient Instructions (Addendum)
Thank you for allowing us to provide your care. Today we started you on Amlodipine 5 mg daily. Please continue all your other medications as prescribed. I will see you back in 3 months or sooner if anything arises.

## 2018-01-30 NOTE — Assessment & Plan Note (Signed)
Patient continues to follow with psychiatry but states that the clinic typically refills her medication. Will refill them today but encouraged her to follow-up with her psychiatrist for future refills.

## 2018-01-30 NOTE — Assessment & Plan Note (Signed)
Patient is on zyrtec, Singulair and Albuterol with good control of her Asthma. She does not have daily or nightly symptoms and reports <2 episodes per month. We will refill her medications today.

## 2018-01-30 NOTE — Assessment & Plan Note (Addendum)
Patient presented for continued evaluation and management of her HTN. She states that she is compliant with her medication but did not take her lisinopril today. She does occasionally check her BP outside of the clinic and it will sometime be >170/100. She states this usually happens after walking some distance. She denies any headaches, visual changes, chest pain, SOB, abdominal pain, orthostatic symptoms.   On PE her BP is 147/98 and she is tachycardic. She states that she just walked across the hospital. On recheck her pulse is decreased but her BP remains elevated.   Review of medical records indicates that the patient was previously on HCTZ-Lisinopril combo pil but it was discontinued due to hypokalemia. We discussed the importance of lowering her BP to prevent CVA and MI. She expresses understanding and is willing to add another drug to her medical management. Today we will add Amlodipine 5 mg QD with plans to increase to 10 mg QD if BP continues to be uncontrolled.   Plan: - Continue Lisinopril 40 mg QD  - Add Amlodipine 5 mg QD with plans to increase to 10 mg QD if BP continues to be uncontrolled.  - BP recheck in 3 months

## 2018-01-30 NOTE — Assessment & Plan Note (Signed)
Patient is interested in smoking cessation. Smokes 2 cigs/day. Will start gum therapy today. Will follow-up if this does not help.

## 2018-02-03 NOTE — Progress Notes (Signed)
Internal Medicine Clinic Attending  Case discussed with Dr. Helberg at the time of the visit.  We reviewed the resident's history and exam and pertinent patient test results.  I agree with the assessment, diagnosis, and plan of care documented in the resident's note.    

## 2018-04-24 ENCOUNTER — Other Ambulatory Visit: Payer: Self-pay | Admitting: Internal Medicine

## 2018-04-24 DIAGNOSIS — Z1231 Encounter for screening mammogram for malignant neoplasm of breast: Secondary | ICD-10-CM

## 2018-05-01 ENCOUNTER — Encounter: Payer: Medicaid Other | Admitting: Internal Medicine

## 2018-06-05 ENCOUNTER — Ambulatory Visit
Admission: RE | Admit: 2018-06-05 | Discharge: 2018-06-05 | Disposition: A | Discharge status: Home / Self Care | Payer: Medicaid Other | Source: Ambulatory Visit | Attending: Family Medicine | Admitting: Family Medicine

## 2018-06-05 DIAGNOSIS — Z1231 Encounter for screening mammogram for malignant neoplasm of breast: Secondary | ICD-10-CM

## 2018-06-13 DIAGNOSIS — F331 Major depressive disorder, recurrent, moderate: Secondary | ICD-10-CM | POA: Diagnosis not present

## 2018-08-14 ENCOUNTER — Encounter: Payer: Medicaid Other | Admitting: Internal Medicine

## 2018-09-22 ENCOUNTER — Encounter: Payer: Medicaid Other | Admitting: Internal Medicine

## 2018-11-02 ENCOUNTER — Encounter (HOSPITAL_COMMUNITY): Payer: Self-pay | Admitting: Emergency Medicine

## 2018-11-02 ENCOUNTER — Emergency Department (HOSPITAL_COMMUNITY): Payer: Medicaid Other

## 2018-11-02 ENCOUNTER — Emergency Department (HOSPITAL_COMMUNITY)
Admission: EM | Admit: 2018-11-02 | Discharge: 2018-11-02 | Disposition: A | Discharge status: Home / Self Care | Payer: Medicaid Other | Attending: Emergency Medicine | Admitting: Emergency Medicine

## 2018-11-02 ENCOUNTER — Other Ambulatory Visit: Payer: Self-pay

## 2018-11-02 DIAGNOSIS — I10 Essential (primary) hypertension: Secondary | ICD-10-CM | POA: Diagnosis not present

## 2018-11-02 DIAGNOSIS — S93402A Sprain of unspecified ligament of left ankle, initial encounter: Secondary | ICD-10-CM

## 2018-11-02 DIAGNOSIS — Y999 Unspecified external cause status: Secondary | ICD-10-CM | POA: Insufficient documentation

## 2018-11-02 DIAGNOSIS — Z79899 Other long term (current) drug therapy: Secondary | ICD-10-CM | POA: Insufficient documentation

## 2018-11-02 DIAGNOSIS — M25572 Pain in left ankle and joints of left foot: Secondary | ICD-10-CM | POA: Diagnosis not present

## 2018-11-02 DIAGNOSIS — Y939 Activity, unspecified: Secondary | ICD-10-CM | POA: Diagnosis not present

## 2018-11-02 DIAGNOSIS — Y9301 Activity, walking, marching and hiking: Secondary | ICD-10-CM | POA: Diagnosis not present

## 2018-11-02 DIAGNOSIS — X501XXA Overexertion from prolonged static or awkward postures, initial encounter: Secondary | ICD-10-CM | POA: Diagnosis not present

## 2018-11-02 DIAGNOSIS — M7989 Other specified soft tissue disorders: Secondary | ICD-10-CM | POA: Diagnosis not present

## 2018-11-02 DIAGNOSIS — J45909 Unspecified asthma, uncomplicated: Secondary | ICD-10-CM | POA: Insufficient documentation

## 2018-11-02 DIAGNOSIS — Y929 Unspecified place or not applicable: Secondary | ICD-10-CM | POA: Insufficient documentation

## 2018-11-02 DIAGNOSIS — M79672 Pain in left foot: Secondary | ICD-10-CM | POA: Diagnosis not present

## 2018-11-02 DIAGNOSIS — F17210 Nicotine dependence, cigarettes, uncomplicated: Secondary | ICD-10-CM | POA: Diagnosis not present

## 2018-11-02 DIAGNOSIS — S99912A Unspecified injury of left ankle, initial encounter: Secondary | ICD-10-CM | POA: Diagnosis not present

## 2018-11-02 DIAGNOSIS — S99922A Unspecified injury of left foot, initial encounter: Secondary | ICD-10-CM | POA: Diagnosis not present

## 2018-11-02 MED ORDER — ACETAMINOPHEN 500 MG PO TABS
1000.0000 mg | ORAL_TABLET | Freq: Once | ORAL | Status: AC
  Administered 2018-11-02: 1000 mg via ORAL
  Filled 2018-11-02: qty 2

## 2018-11-02 NOTE — ED Triage Notes (Signed)
Patient c/o left foot and ankle pain since twisting ankle on Friday. Swelling noted to ankle.

## 2018-11-02 NOTE — ED Provider Notes (Signed)
[facility]-EMERGENCY DEPT Provider Note   CSN: [id number] Arrival date & time: 11/02/18  2151     History   Chief Complaint Chief Complaint  Patient presents with  . Foot Pain    HPI [name] is a 47 y.o. female with a history of bipolar disorder, asthma, and HTN who presents to the emergency department with a chief complaint of left ankle injury.  The patient states that she twisted her left ankle while walking 2 days ago.  She denies falling, hitting her head, LOC, nausea, or emesis.  She reports that she is only able to put weight on her toes to walk.  Pain is been constant since onset.  Worse with weightbearing and ambulation and improved with nonweightbearing.  She denies left knee pain, numbness, or weakness.  No treatment prior to arrival.  The history is provided by the patient. No language interpreter was used.    Past Medical History:  Diagnosis Date  . Anemia   . Asthma   . Bipolar disorder (HCC)   . Cellulitis of right thumb 06/14/2016  . Chronic bronchitis (HCC)   . Hypertension   . Mitral valve prolapse     Patient Active Problem List   Diagnosis Date Noted  . Tobacco abuse 01/30/2018  . Bipolar disorder (HCC) 06/14/2016  . Vitamin D insufficiency 02/04/2016  . Encounter for screening for cervical cancer  03/17/2015  . Healthcare maintenance 03/17/2015  . Anxiety disorder 06/04/2014  . Asthma in adult 06/04/2014  . Hypertension 06/04/2014  . Chronic allergic rhinitis 06/04/2014    Past Surgical History:  Procedure Laterality Date  . NO PAST SURGERIES       OB History   None      Home Medications    Prior to Admission medications   Medication Sig Start Date End Date Taking? Authorizing Provider  albuterol (PROVENTIL HFA;VENTOLIN HFA) 108 (90 Base) MCG/ACT inhaler Inhale 2 puffs into the lungs every 6 (six) hours as needed for wheezing or shortness of breath. 01/30/18   Helberg, Jill AlexandersJustin, MD  amLODipine (NORVASC) 5 MG  tablet Take 1 tablet (5 mg total) by mouth daily. 01/30/18 01/30/19  Levora DredgeHelberg, Justin, MD  cetirizine (ZYRTEC) 10 MG tablet Take 1 tablet (10 mg total) by mouth daily. 01/30/18   Levora DredgeHelberg, Justin, MD  hydrOXYzine (ATARAX/VISTARIL) 25 MG tablet Take 1 tablet (25 mg total) by mouth every 8 (eight) hours as needed for anxiety. 01/30/18   Levora DredgeHelberg, Justin, MD  ibuprofen (ADVIL,MOTRIN) 200 MG tablet Take 2 tablets (400 mg total) by mouth every 6 (six) hours as needed for mild pain. 06/28/16   Carolynn CommentStrelow, Bryan, MD  lisinopril (PRINIVIL,ZESTRIL) 40 MG tablet TAKE 1 TABLET(40 MG) BY MOUTH DAILY 01/30/18   Levora DredgeHelberg, Justin, MD  mirtazapine (REMERON) 45 MG tablet Take 1 tablet (45 mg total) by mouth at bedtime. 01/30/18   Helberg, Jill AlexandersJustin, MD  mometasone (NASONEX) 50 MCG/ACT nasal spray Place 2 sprays into the nose daily. 01/30/18   Helberg, Jill AlexandersJustin, MD  montelukast (SINGULAIR) 10 MG tablet Take 1 tablet (10 mg total) by mouth daily. 01/30/18 01/30/19  Levora DredgeHelberg, Justin, MD  nicotine polacrilex (NICORETTE) 4 MG gum Take 1 each (4 mg total) by mouth as needed for smoking cessation. 01/30/18   Levora DredgeHelberg, Justin, MD  QUEtiapine (SEROQUEL) 50 MG tablet Take 3 tablets (150 mg total) by mouth at bedtime. 01/30/18   Levora DredgeHelberg, Justin, MD  Vitamin D, Cholecalciferol, 1000 units TABS Take 1 tablet by mouth daily. 01/30/18  Levora Dredge, MD    Family History Family History  Problem Relation Age of Onset  . Breast cancer Paternal Aunt     Social History Social History   Tobacco Use  . Smoking status: Current Every Day Smoker    Packs/day: 0.10    Years: 25.00    Pack years: 2.50    Types: Cigarettes  . Smokeless tobacco: Never Used  . Tobacco comment: 4  per day .  Wants Patches   Substance Use Topics  . Alcohol use: Yes    Alcohol/week: 2.0 standard drinks    Types: 1 Glasses of wine, 1 Cans of beer per week  . Drug use: Yes    Types: Marijuana    Comment: 06/14/2016 "since I lost my mom in 2015; maybe use it 1-2  times/month"     Allergies   Patient has no known allergies.   Review of Systems Review of Systems  Constitutional: Negative for activity change, chills and fever.  Respiratory: Negative for shortness of breath.   Cardiovascular: Negative for chest pain.  Gastrointestinal: Negative for abdominal pain.  Musculoskeletal: Positive for arthralgias, gait problem, joint swelling and myalgias. Negative for back pain.  Skin: Positive for color change. Negative for rash and wound.  Neurological: Negative for seizures and weakness.     Physical Exam Updated Vital Signs BP 122/89 (BP Location: Left Arm)   Pulse 91   Temp 97.7 F (36.5 C) (Oral)   Resp 18   SpO2 100%   Physical Exam  Constitutional: No distress.  HENT:  Head: Normocephalic.  Eyes: Conjunctivae are normal.  Neck: Neck supple.  Cardiovascular: Normal rate and regular rhythm. Exam reveals no gallop and no friction rub.  No murmur heard. Pulmonary/Chest: Effort normal. No respiratory distress.  Abdominal: Soft. She exhibits no distension.  Musculoskeletal: She exhibits edema and tenderness. She exhibits no deformity.  Tender to palpation to the lateral malleolus of the left ankle with mild overlying swelling and ecchymosis.  No warmth or redness.  Decreased strength against resistance of the left foot secondary to pain.  Patient is able to bear weight on the bilateral lower extremities.  DP and PT pulses are 2+ and symmetric.  Antalgic gait.  Normal exam of the left knee.  No tenderness palpation in the dorsum of the left foot on the left heel.  Neurological: She is alert.  Skin: Skin is warm. No rash noted.  Psychiatric: Her behavior is normal.  Nursing note and vitals reviewed.    ED Treatments / Results  Labs (all labs ordered are listed, but only abnormal results are displayed) Labs Reviewed - No data to display  EKG None  Radiology Dg Ankle Complete Left  Result Date: 11/02/2018 CLINICAL DATA:   Twisting injury on Friday. Left ankle pain and swelling. Initial encounter. EXAM: LEFT ANKLE COMPLETE - 3+ VIEW COMPARISON:  None. FINDINGS: There is no evidence of fracture, dislocation, or joint effusion. There is no evidence of arthropathy or other focal bone abnormality. Soft tissues are unremarkable. IMPRESSION: Negative. Electronically Signed   By: Myles Rosenthal M.D.   On: 11/02/2018 22:47   Dg Foot Complete Left  Result Date: 11/02/2018 CLINICAL DATA:  Twisting injury 2 days ago. Left foot pain and swelling. EXAM: LEFT FOOT - COMPLETE 3+ VIEW COMPARISON:  02/20/2015 FINDINGS: There is no evidence of fracture or dislocation. Mild hallux valgus with bony bunion again noted. Soft tissues are unremarkable. IMPRESSION: No acute findings. Electronically Signed   By: Jonny Ruiz  Eppie Gibson M.D.   On: 11/02/2018 22:48    Procedures Procedures (including critical care time)  Medications Ordered in ED Medications  acetaminophen (TYLENOL) tablet 1,000 mg (1,000 mg Oral Given 11/02/18 2306)     Initial Impression / Assessment and Plan / ED Course  I have reviewed the triage vital signs and the nursing notes.  Pertinent labs & imaging results that were available during my care of the patient were reviewed by me and considered in my medical decision making (see chart for details).     Patient X-Ray negative for obvious fracture or dislocation. Pain managed in ED. Pt advised to follow up with orthopedics if symptoms persist for possibility of missed fracture diagnosis. Patient given brace while in ED, conservative therapy recommended and discussed. Patient will be dc home & is agreeable with above plan.  Final Clinical Impressions(s) / ED Diagnoses   Final diagnoses:  Sprain of left ankle, unspecified ligament, initial encounter    ED Discharge Orders    None       Aviannah Castoro A, PA-C 11/03/18 0201    Melene Plan, DO 11/04/18 1113

## 2018-11-02 NOTE — Discharge Instructions (Signed)
Thank you for allowing me to care for you today in the Emergency Department.   Use the crutches until you can put weight on your left foot without a significant amount of pain then you can stop using them.  Apply an ice pack for 15 to 20 minutes as frequently as needed.  Elevate your left leg so that your toes are at or above the level of your nose to help with pain and swelling.  Take 650 mg of Tylenol or 600 mg of ibuprofen up with your pain.  Wear the brace to help stabilize your ankle.  Start to use the attached exercises to strengthen your ankle.  Follow up with primary care if your pain does not start to improve in the next week with this regimen.  Return to the emergency department if you have any fall or injury, if you develop high fever, or if you develop new weakness, or other new, concerning symptoms.

## 2018-12-23 ENCOUNTER — Encounter: Payer: Self-pay | Admitting: Internal Medicine

## 2019-01-14 DIAGNOSIS — F331 Major depressive disorder, recurrent, moderate: Secondary | ICD-10-CM | POA: Diagnosis not present

## 2019-03-05 ENCOUNTER — Other Ambulatory Visit: Payer: Self-pay

## 2019-03-05 ENCOUNTER — Ambulatory Visit (INDEPENDENT_AMBULATORY_CARE_PROVIDER_SITE_OTHER): Payer: Medicaid Other | Admitting: Internal Medicine

## 2019-03-05 ENCOUNTER — Encounter: Payer: Self-pay | Admitting: Internal Medicine

## 2019-03-05 DIAGNOSIS — J309 Allergic rhinitis, unspecified: Secondary | ICD-10-CM

## 2019-03-05 DIAGNOSIS — Z720 Tobacco use: Secondary | ICD-10-CM | POA: Diagnosis not present

## 2019-03-05 DIAGNOSIS — I10 Essential (primary) hypertension: Secondary | ICD-10-CM | POA: Diagnosis not present

## 2019-03-05 DIAGNOSIS — F314 Bipolar disorder, current episode depressed, severe, without psychotic features: Secondary | ICD-10-CM

## 2019-03-05 DIAGNOSIS — J4520 Mild intermittent asthma, uncomplicated: Secondary | ICD-10-CM

## 2019-03-05 DIAGNOSIS — F419 Anxiety disorder, unspecified: Secondary | ICD-10-CM | POA: Diagnosis not present

## 2019-03-05 MED ORDER — MOMETASONE FUROATE 50 MCG/ACT NA SUSP: 2.0000 | Freq: Every day | NASAL | 0 refills | Status: DC

## 2019-03-05 MED ORDER — LISINOPRIL 40 MG PO TABS: ORAL_TABLET | ORAL | 2 refills | Status: DC

## 2019-03-05 MED ORDER — MIRTAZAPINE 45 MG PO TABS: 45.0000 mg | ORAL_TABLET | Freq: Every day | ORAL | 2 refills | Status: DC

## 2019-03-05 MED ORDER — ALBUTEROL SULFATE HFA 108 (90 BASE) MCG/ACT IN AERS: 2.0000 | INHALATION_SPRAY | Freq: Four times a day (QID) | RESPIRATORY_TRACT | 2 refills | Status: DC | PRN

## 2019-03-05 MED ORDER — NICOTINE 7 MG/24HR TD PT24: 7.0000 mg | MEDICATED_PATCH | TRANSDERMAL | 2 refills | Status: DC

## 2019-03-05 MED ORDER — MONTELUKAST SODIUM 10 MG PO TABS: 10.0000 mg | ORAL_TABLET | Freq: Every day | ORAL | 2 refills | Status: DC

## 2019-03-05 MED ORDER — QUETIAPINE FUMARATE 50 MG PO TABS: 150.0000 mg | ORAL_TABLET | Freq: Every day | ORAL | 2 refills | Status: DC

## 2019-03-05 MED ORDER — CETIRIZINE HCL 10 MG PO TABS: 10.0000 mg | ORAL_TABLET | Freq: Every day | ORAL | 3 refills | Status: DC

## 2019-03-05 MED ORDER — AMLODIPINE BESYLATE 5 MG PO TABS: 5.0000 mg | ORAL_TABLET | Freq: Every day | ORAL | 2 refills | Status: DC

## 2019-03-05 MED ORDER — HYDROXYZINE HCL 25 MG PO TABS: 25.0000 mg | ORAL_TABLET | Freq: Three times a day (TID) | ORAL | 2 refills | Status: DC | PRN

## 2019-03-05 NOTE — Patient Instructions (Addendum)
Discussed with patient COVID precautions and if he begins to experience any symptoms we have a telephone service he can call. She voices understanding. All questions and concerns addressed.  

## 2019-03-05 NOTE — Progress Notes (Addendum)
   CC: F/u Anxiety/Depression, Hypertension, Asthma, and Smoking   HPI:  This is a telephone encounter between JAZLEN LEMAN and Levora Dredge on 03/05/2019 for F/u Anxiety/Depression, Hypertension, Asthma, and Smoking . The visit was conducted with the patient located at home and [facility] at [facility]. The patient's identity was confirmed using their DOB and current address. The patient has consented to being evaluated through a telephone encounter and understands the associated risks (an examination cannot be done and the patient may need to come in for an appointment) / benefits (allows the patient to remain at home, decreasing exposure to coronavirus). I personally spent 14 minutes on medical discussion.   Past Medical History:  Diagnosis Date  . Anemia   . Asthma   . Bipolar disorder (HCC)   . Cellulitis of right thumb 06/14/2016  . Chronic bronchitis (HCC)   . Hypertension   . Mitral valve prolapse    Review of Systems:  Performed and all others negative.  Assessment & Plan:   See Encounters Tab for problem based charting.  Patient discussed with Dr. Cyndie Chime  Medicine attending: Medical history, presenting problems,  and medications, reviewed with resident physician Dr Levora Dredge on the day of the telephone consultation and I concur with his evaluation and management plan.

## 2019-03-05 NOTE — Assessment & Plan Note (Signed)
HPI: - Currently on Zyrtec, Singulair, and Albuterol  - Uses recuse inhaler <2 times per week, no night symptoms  - Does have ocasional "exacerbation" on high pollen days - Currently still smoking but interested in quitting  A/P: - Continue Zyrtec, Singulair, and Albuterol  - Encouraged smoking cessation, patches set

## 2019-03-05 NOTE — Assessment & Plan Note (Signed)
HPI: - Currently on Amlodipine 5 mg and Lisinopril 40 mg QD  - No issues affording medications or with side effects.  - No symptoms of orthostasis - Does need refills on her medications  - Voices that has a visit to her psychiatrist office last month her BP was initially 180/70 however, on recheck came down to below 140/80.   A/P: - Continue Amlodipine 5 mg and Lisinopril 40 mg QD. Refills sent  - Needs BMP to check renal function and potassium. Patient declined coming for lab only visit. Will arrange for a BMP at next office visit.

## 2019-03-05 NOTE — Assessment & Plan Note (Signed)
HPI: - Very anxious about current pandemic  - Has been isolating herself inside which has made the anxiety worse.  - Typically walks help with her anxiety.  - Using hydroxyzine 2x per day, this does seem to help. Also on Seroquel and Mirtazapine for her Bipolar  A/P: - Discussed that her hydroxyzine can be used TID  - Discussed social distancing and that she can still go outside for some air  - She will call back if symptoms do not improve

## 2019-03-05 NOTE — Assessment & Plan Note (Signed)
HPI: - Currently smokes 3-4 cigarettes per day  - Interested in quitting  - Insurance would not cover her gum   A/P: - Will send out 7 mg patches

## 2019-07-13 ENCOUNTER — Other Ambulatory Visit: Payer: Self-pay | Admitting: Family Medicine

## 2019-07-13 DIAGNOSIS — Z1231 Encounter for screening mammogram for malignant neoplasm of breast: Secondary | ICD-10-CM

## 2019-08-27 ENCOUNTER — Other Ambulatory Visit (HOSPITAL_COMMUNITY)
Admission: RE | Admit: 2019-08-27 | Discharge: 2019-08-27 | Disposition: A | Discharge status: Home / Self Care | Payer: Medicaid Other | Source: Ambulatory Visit | Attending: Internal Medicine | Admitting: Internal Medicine

## 2019-08-27 ENCOUNTER — Encounter: Payer: Self-pay | Admitting: Internal Medicine

## 2019-08-27 ENCOUNTER — Ambulatory Visit (INDEPENDENT_AMBULATORY_CARE_PROVIDER_SITE_OTHER): Payer: Medicaid Other | Admitting: Internal Medicine

## 2019-08-27 ENCOUNTER — Other Ambulatory Visit: Payer: Self-pay

## 2019-08-27 ENCOUNTER — Ambulatory Visit
Admission: RE | Admit: 2019-08-27 | Discharge: 2019-08-27 | Disposition: A | Discharge status: Home / Self Care | Payer: Medicaid Other | Source: Ambulatory Visit | Attending: Family Medicine | Admitting: Family Medicine

## 2019-08-27 ENCOUNTER — Other Ambulatory Visit: Payer: Self-pay | Admitting: Internal Medicine

## 2019-08-27 ENCOUNTER — Ambulatory Visit: Payer: Medicaid Other

## 2019-08-27 VITALS — BP 134/84 | HR 70 | Temp 99.1°F | Ht 64.0 in | Wt 110.8 lb

## 2019-08-27 DIAGNOSIS — N949 Unspecified condition associated with female genital organs and menstrual cycle: Secondary | ICD-10-CM | POA: Diagnosis not present

## 2019-08-27 DIAGNOSIS — Z1231 Encounter for screening mammogram for malignant neoplasm of breast: Secondary | ICD-10-CM | POA: Diagnosis not present

## 2019-08-27 DIAGNOSIS — F17210 Nicotine dependence, cigarettes, uncomplicated: Secondary | ICD-10-CM | POA: Diagnosis not present

## 2019-08-27 LAB — POCT URINALYSIS DIPSTICK
Bilirubin, UA: NEGATIVE
Blood, UA: NEGATIVE
Glucose, UA: NEGATIVE
Ketones, UA: NEGATIVE
Nitrite, UA: POSITIVE
Protein, UA: POSITIVE — AB
Spec Grav, UA: 1.02 (ref 1.010–1.025)
Urobilinogen, UA: 0.2 E.U./dL
pH, UA: 5.5 (ref 5.0–8.0)

## 2019-08-27 NOTE — Patient Instructions (Addendum)
[name], we have collected a sample today and I will call you with the results and appropriate treatment if necessary.

## 2019-08-27 NOTE — Progress Notes (Signed)
Internal Medicine Clinic Attending  Case discussed with Dr. Winfrey  at the time of the visit.  We reviewed the resident's history and exam and pertinent patient test results.  I agree with the assessment, diagnosis, and plan of care documented in the resident's note.  

## 2019-08-27 NOTE — Progress Notes (Signed)
   CC: Vaginal Discomfort  HPI:  [name] is a 48 y.o. female with PMH below.  Today we will address Vaginal Discomfort    Please see A&P for status of the patient's chronic medical conditions  Past Medical History:  Diagnosis Date  . Anemia   . Asthma   . Bipolar disorder ([place])   . Cellulitis of right thumb 06/14/2016  . Chronic bronchitis ([place])   . Hypertension   . Mitral valve prolapse    Review of Systems:  ROS: Pulmonary: pt denies increased work of breathing, shortness of breath,  Cardiac: pt denies palpitations, chest pain,  Abdominal: pt denies abdominal pain, nausea, vomiting, or diarrhea   Physical Exam:  Vitals:   08/27/19 0932  BP: 134/84  Pulse: 70  Temp: 99.1 F (37.3 C)  TempSrc: Oral  SpO2: 100%  Weight: 110 lb 12.8 oz (50.3 kg)  Height: 5\' 4"  (1.626 m)   Cardiac: JVD flat, normal rate and rhythm, clear s1 and s2, no murmurs, rubs or gallops Pulmonary: CTAB, not in distress Abdominal: non distended abdomen, soft and nontender Psych: Alert, conversant, in good spirits GU: copious white vaginal discharge, no erythema or skin breakdown within the vaginal vault   Social History   Socioeconomic History  . Marital status: Single    Spouse name: Not on file  . Number of children: Not on file  . Years of education: Not on file  . Highest education level: Not on file  Occupational History  . Not on file  Social Needs  . Financial resource strain: Not on file  . Food insecurity    Worry: Not on file    Inability: Not on file  . Transportation needs    Medical: Not on file    Non-medical: Not on file  Tobacco Use  . Smoking status: Current Every Day Smoker    Packs/day: 0.10    Years: 25.00    Pack years: 2.50    Types: Cigarettes  . Smokeless tobacco: Never Used  Substance and Sexual Activity  . Alcohol use: Yes    Alcohol/week: 2.0 standard drinks    Types: 1 Glasses of wine, 1 Cans of beer per week    Comment: Socially.  .  Drug use: Yes    Types: Marijuana    Comment: Socially.  . Sexual activity: Not Currently  Lifestyle  . Physical activity    Days per week: Not on file    Minutes per session: Not on file  . Stress: Not on file  Relationships  . Social Herbalist on phone: Not on file    Gets together: Not on file    Attends religious service: Not on file    Active member of club or organization: Not on file    Attends meetings of clubs or organizations: Not on file    Relationship status: Not on file  . Intimate partner violence    Fear of current or ex partner: Not on file    Emotionally abused: Not on file    Physically abused: Not on file    Forced sexual activity: Not on file  Other Topics Concern  . Not on file  Social History Narrative  . Not on file    Family History  Problem Relation Age of Onset  . Breast cancer Paternal Aunt     Assessment & Plan:   See Encounters Tab for problem based charting.  Patient discussed with Dr. Philipp Ovens

## 2019-08-27 NOTE — Assessment & Plan Note (Addendum)
Vaginal discomfort for 1 week.  No dysuria, no increased frequency.  No vaginal discharge, no itching just discomfort and an odor. No systemic symptoms and no flank pain. Had sex with an ex fiance around labor day with no condom.  That was her last sexual encounter.  Before that no encounters for 6-7 months.  Copious thick white discharge on exam.  Urine dip small leuk and nitrite positive but could be incidental  -testing for gc/chlam, trich, BV, candida -also test for HIV last test neg in 2015 -will treat based on results -urine culture in case pt develops further signs/symptoms suggestive of UTI

## 2019-08-28 LAB — CERVICOVAGINAL ANCILLARY ONLY
Bacterial Vaginitis (gardnerella): POSITIVE — AB
Candida Glabrata: NEGATIVE
Candida Vaginitis: NEGATIVE
Molecular Disclaimer: NEGATIVE
Molecular Disclaimer: NEGATIVE
Molecular Disclaimer: NEGATIVE
Molecular Disclaimer: NORMAL
Trichomonas: NEGATIVE

## 2019-08-28 LAB — HIV ANTIBODY (ROUTINE TESTING W REFLEX): HIV Screen 4th Generation wRfx: NONREACTIVE

## 2019-08-29 LAB — URINE CULTURE

## 2019-08-29 LAB — CERVICOVAGINAL ANCILLARY ONLY
Chlamydia: NEGATIVE
Neisseria Gonorrhea: NEGATIVE

## 2019-08-31 ENCOUNTER — Telehealth: Payer: Self-pay | Admitting: Internal Medicine

## 2019-08-31 DIAGNOSIS — B9689 Other specified bacterial agents as the cause of diseases classified elsewhere: Secondary | ICD-10-CM

## 2019-08-31 MED ORDER — METRONIDAZOLE 500 MG PO TABS: 500.0000 mg | ORAL_TABLET | Freq: Two times a day (BID) | ORAL | 0 refills | Status: AC

## 2019-08-31 NOTE — Telephone Encounter (Signed)
Spoke with patient about results and positive wet prep for BV.  Will prescribe her metronidazole 7 day course.

## 2019-09-17 ENCOUNTER — Ambulatory Visit (INDEPENDENT_AMBULATORY_CARE_PROVIDER_SITE_OTHER): Payer: Medicaid Other | Admitting: Internal Medicine

## 2019-09-17 ENCOUNTER — Other Ambulatory Visit: Payer: Self-pay

## 2019-09-17 ENCOUNTER — Encounter: Payer: Self-pay | Admitting: Internal Medicine

## 2019-09-17 VITALS — BP 159/89 | HR 70 | Temp 98.4°F | Ht 64.0 in | Wt 115.1 lb

## 2019-09-17 DIAGNOSIS — J45909 Unspecified asthma, uncomplicated: Secondary | ICD-10-CM

## 2019-09-17 DIAGNOSIS — E559 Vitamin D deficiency, unspecified: Secondary | ICD-10-CM

## 2019-09-17 DIAGNOSIS — J309 Allergic rhinitis, unspecified: Secondary | ICD-10-CM

## 2019-09-17 DIAGNOSIS — Z720 Tobacco use: Secondary | ICD-10-CM | POA: Diagnosis not present

## 2019-09-17 DIAGNOSIS — I10 Essential (primary) hypertension: Secondary | ICD-10-CM

## 2019-09-17 DIAGNOSIS — F314 Bipolar disorder, current episode depressed, severe, without psychotic features: Secondary | ICD-10-CM

## 2019-09-17 DIAGNOSIS — F319 Bipolar disorder, unspecified: Secondary | ICD-10-CM | POA: Diagnosis not present

## 2019-09-17 DIAGNOSIS — Z79899 Other long term (current) drug therapy: Secondary | ICD-10-CM

## 2019-09-17 DIAGNOSIS — J4520 Mild intermittent asthma, uncomplicated: Secondary | ICD-10-CM

## 2019-09-17 MED ORDER — MONTELUKAST SODIUM 10 MG PO TABS: 10.0000 mg | ORAL_TABLET | Freq: Every day | ORAL | 2 refills | Status: DC

## 2019-09-17 MED ORDER — LISINOPRIL 40 MG PO TABS: ORAL_TABLET | ORAL | 2 refills | Status: DC

## 2019-09-17 MED ORDER — QUETIAPINE FUMARATE 50 MG PO TABS: 150.0000 mg | ORAL_TABLET | Freq: Every day | ORAL | 2 refills | Status: DC

## 2019-09-17 MED ORDER — MOMETASONE FUROATE 50 MCG/ACT NA SUSP: 2.0000 | Freq: Every day | NASAL | 0 refills | Status: DC

## 2019-09-17 MED ORDER — AMLODIPINE BESYLATE 5 MG PO TABS: 5.0000 mg | ORAL_TABLET | Freq: Every day | ORAL | 2 refills | Status: DC

## 2019-09-17 MED ORDER — VITAMIN D (CHOLECALCIFEROL) 25 MCG (1000 UT) PO TABS: 1.0000 | ORAL_TABLET | Freq: Every day | ORAL | 2 refills | Status: DC

## 2019-09-17 MED ORDER — MIRTAZAPINE 45 MG PO TABS: 45.0000 mg | ORAL_TABLET | Freq: Every day | ORAL | 2 refills | Status: DC

## 2019-09-17 MED ORDER — CETIRIZINE HCL 10 MG PO TABS: 10.0000 mg | ORAL_TABLET | Freq: Every day | ORAL | 3 refills | Status: DC

## 2019-09-17 MED ORDER — HYDROXYZINE HCL 25 MG PO TABS: 25.0000 mg | ORAL_TABLET | Freq: Three times a day (TID) | ORAL | 2 refills | Status: DC | PRN

## 2019-09-17 MED ORDER — NICOTINE 7 MG/24HR TD PT24: 7.0000 mg | MEDICATED_PATCH | TRANSDERMAL | 2 refills | Status: AC

## 2019-09-17 MED ORDER — ALBUTEROL SULFATE HFA 108 (90 BASE) MCG/ACT IN AERS: 2.0000 | INHALATION_SPRAY | Freq: Four times a day (QID) | RESPIRATORY_TRACT | 12 refills | Status: DC | PRN

## 2019-09-17 NOTE — Assessment & Plan Note (Signed)
HPI:  Denies nocturnal cough, daytime shortness of breath, greater than two times per week rescue inhaler. She is currently taking all her medications as prescribed.  A/P: - Well controlled. Continue cetirizine 10 mg QD, Montelukast 10 mg, mometasone nasal spray, and PRN albuterol

## 2019-09-17 NOTE — Progress Notes (Signed)
Internal Medicine Clinic Attending  Case discussed with Dr. Helberg  soon after the resident saw the patient.  We reviewed the resident's history and exam and pertinent patient test results.  I agree with the assessment, diagnosis, and plan of care documented in the resident's note.  

## 2019-09-17 NOTE — Patient Instructions (Signed)
Thank you for allowing me to provide your care. Today we refilled all your medications and are going to check some blood work. I would like to see you back in 3 months for a blood pressure check.

## 2019-09-17 NOTE — Assessment & Plan Note (Signed)
Refilled vitamin D. Check level.

## 2019-09-17 NOTE — Assessment & Plan Note (Signed)
HPI: Previously smoked 1PPD. Now has a grandchild in her house so she is actively trying to quit smoking. She is down to four cigarettes per day. She is interested in patches.  A/P: - Nicotine patch 7 mg QD

## 2019-09-17 NOTE — Progress Notes (Signed)
   CC: HTN, tobacco use  HPI:  [name] is a 48 y.o. female with PMHx listed below presenting for HTN, tobacco use. Please see the A&P for the status of the patient's chronic medical problems.  Past Medical History:  Diagnosis Date  . Anemia   . Asthma   . Bipolar disorder ([place])   . Cellulitis of right thumb 06/14/2016  . Chronic bronchitis ([place])   . Hypertension   . Mitral valve prolapse    Review of Systems:  Performed and all others negative.  Physical Exam: Vitals:   09/17/19 0952 09/17/19 1016  BP: (!) 176/111 (!) 159/89  Pulse: 77 70  Temp: 98.4 F (36.9 C)   TempSrc: Oral   SpO2: 99%   Weight: 115 lb 1.6 oz (52.2 kg)   Height: 5\' 4"  (1.626 m)    General: Well nourished female in no acute distress Pulm: Good air movement with no wheezing or crackles  CV: RRR, no murmurs, no rubs  Abdomen: Active bowel sounds, soft, non-distended, no tenderness to palpation  Extremities: Pulses palpable in all extremities, no LE edema   Assessment & Plan:   See Encounters Tab for problem based charting.  Patient discussed with Dr. Daryll Drown

## 2019-09-17 NOTE — Assessment & Plan Note (Signed)
HPI:  Known bipolar currently on hydroxyzine, mirtazapine, quetiapine. She typically follows with [facility] however due to coronavirus she is been unable to schedule follow-up. She is currently almost out of her medications and is requesting a refill. She is actively working schedule follow-up with [facility]  A/P: - Refill hydroxyzine 25 mg TID PRN, Mirtazapine 45 mg QHS, and Quetiapine 150 mg QHS.  - Patient to follow up with [facility]

## 2019-09-17 NOTE — Assessment & Plan Note (Signed)
HPI:  Patient currently on amlodipine and lisinopril. She takes these medications daily without apparent side effects. She has no issues affording her medications. She is trying to quit smoking and is down to four cigarettes per day. She is no longer using alcohol.  A/P: - Uncontrolled. Continue amlodipine 5 mg and lisinopril 40 mg. Will need another medication added but will check a BMP first.

## 2019-09-17 NOTE — Assessment & Plan Note (Signed)
Refills for cetirizine, montelukast, and mometasone nasal spray sent in. For assessment see problem "Asthma in adult."

## 2019-09-18 LAB — BMP8+ANION GAP
Anion Gap: 19 mmol/L — ABNORMAL HIGH (ref 10.0–18.0)
BUN/Creatinine Ratio: 6 — ABNORMAL LOW (ref 9–23)
BUN: 4 mg/dL — ABNORMAL LOW (ref 6–24)
CO2: 22 mmol/L (ref 20–29)
Calcium: 10.2 mg/dL (ref 8.7–10.2)
Chloride: 103 mmol/L (ref 96–106)
Creatinine, Ser: 0.65 mg/dL (ref 0.57–1.00)
GFR calc Af Amer: 122 mL/min/{1.73_m2} (ref >59)
GFR calc non Af Amer: 106 mL/min/{1.73_m2} (ref >59)
Glucose: 83 mg/dL (ref 65–99)
Potassium: 3.6 mmol/L (ref 3.5–5.2)
Sodium: 144 mmol/L (ref 134–144)

## 2019-09-18 LAB — VITAMIN D 25 HYDROXY (VIT D DEFICIENCY, FRACTURES): Vit D, 25-Hydroxy: 22.1 ng/mL — ABNORMAL LOW (ref 30.0–100.0)

## 2020-04-14 ENCOUNTER — Encounter: Payer: Self-pay | Admitting: *Deleted

## 2020-09-19 IMAGING — MG MM DIGITAL SCREENING BILAT W/ TOMO W/ CAD
6 of 10 series · 6 of 30 positions shown · non-contrast
Comparison: Previous exam(s).

CLINICAL DATA: Screening.

EXAM:
DIGITAL SCREENING BILATERAL MAMMOGRAM WITH TOMO AND CAD

[L MLO synth-2D]
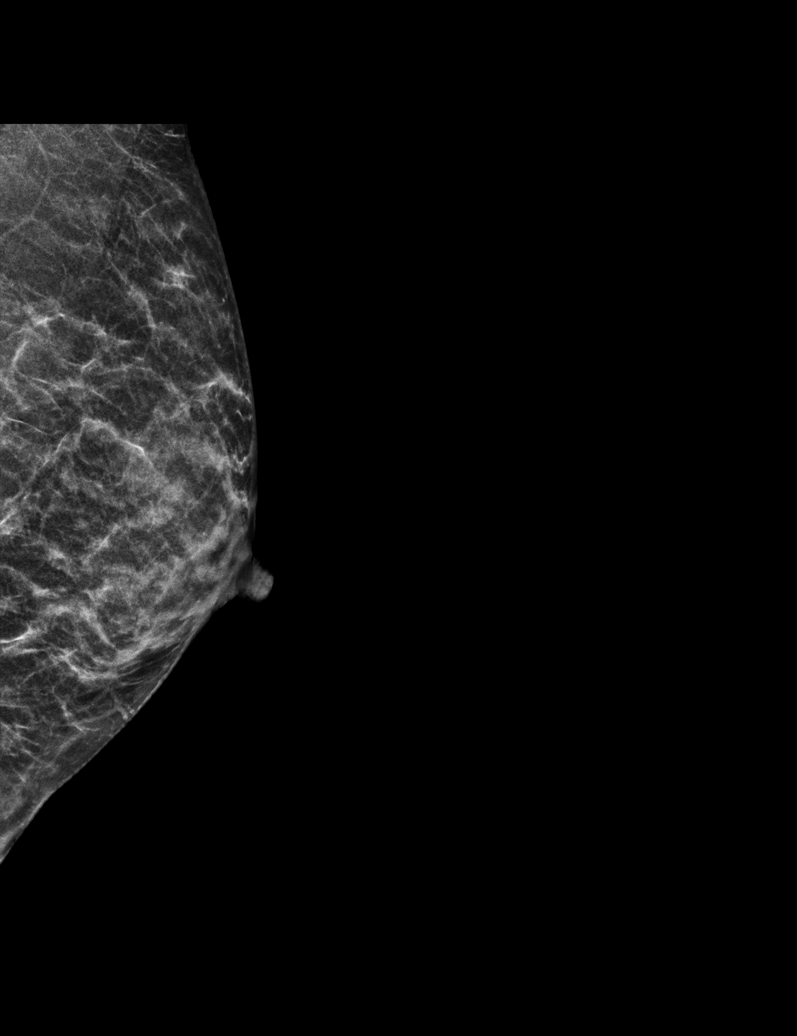

[R MLO synth-2D]
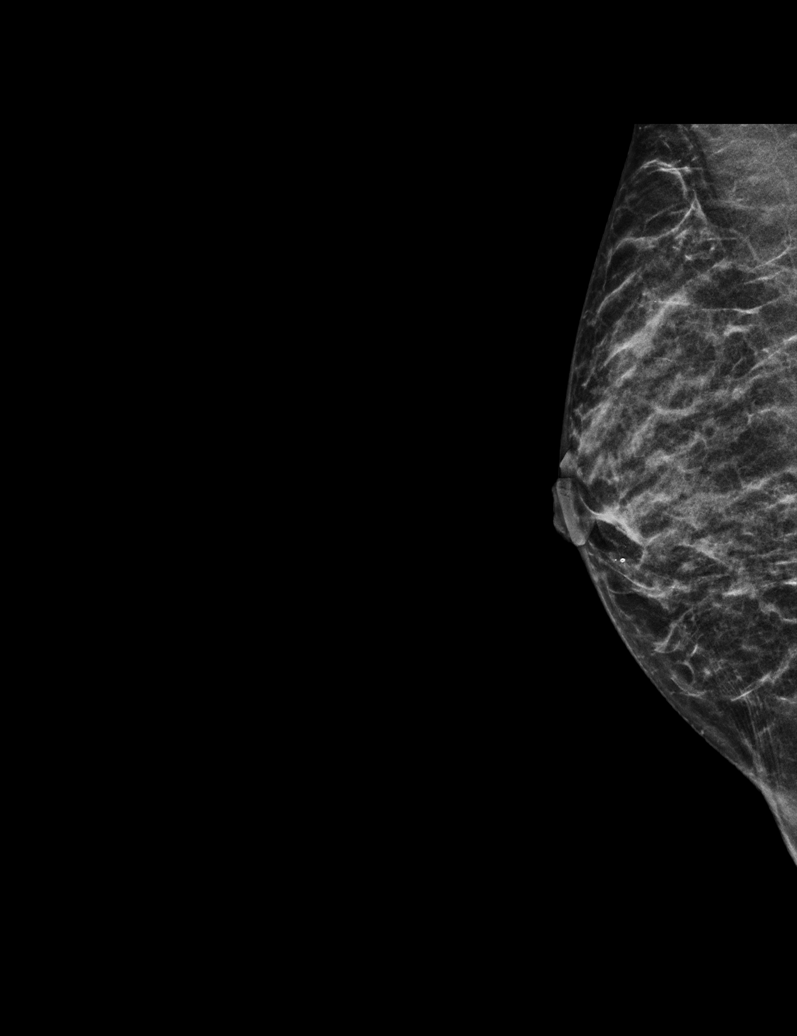

[R CC synth-2D (1 of 2)]
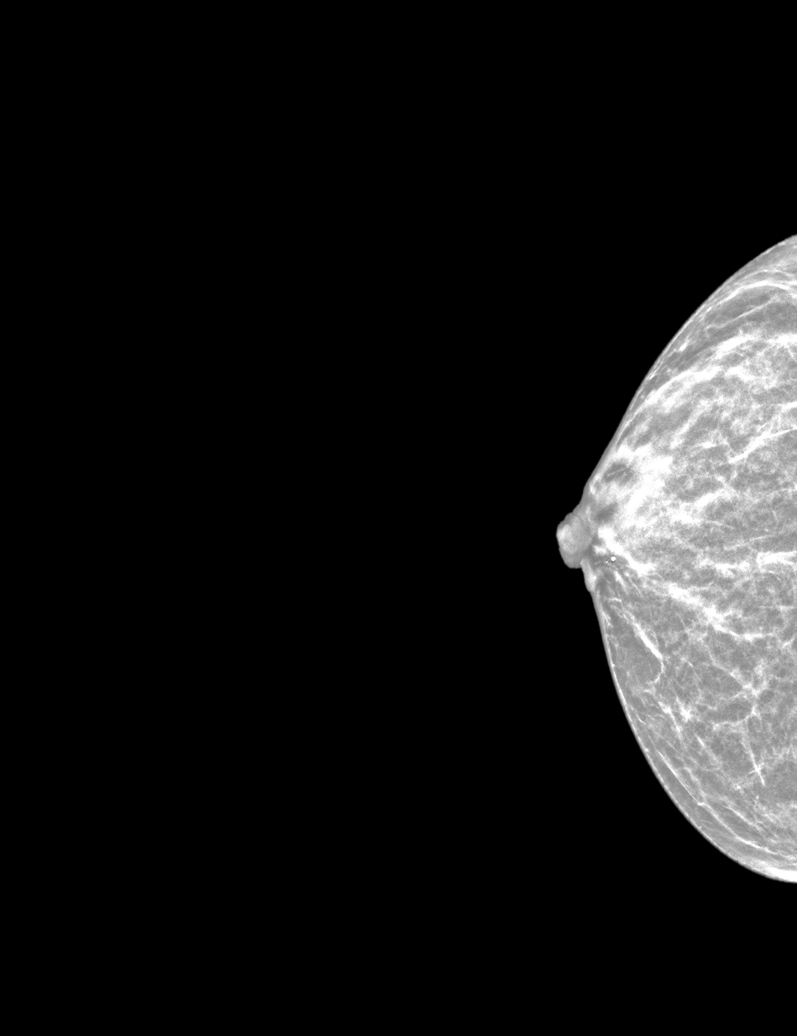

[L CC synth-2D]
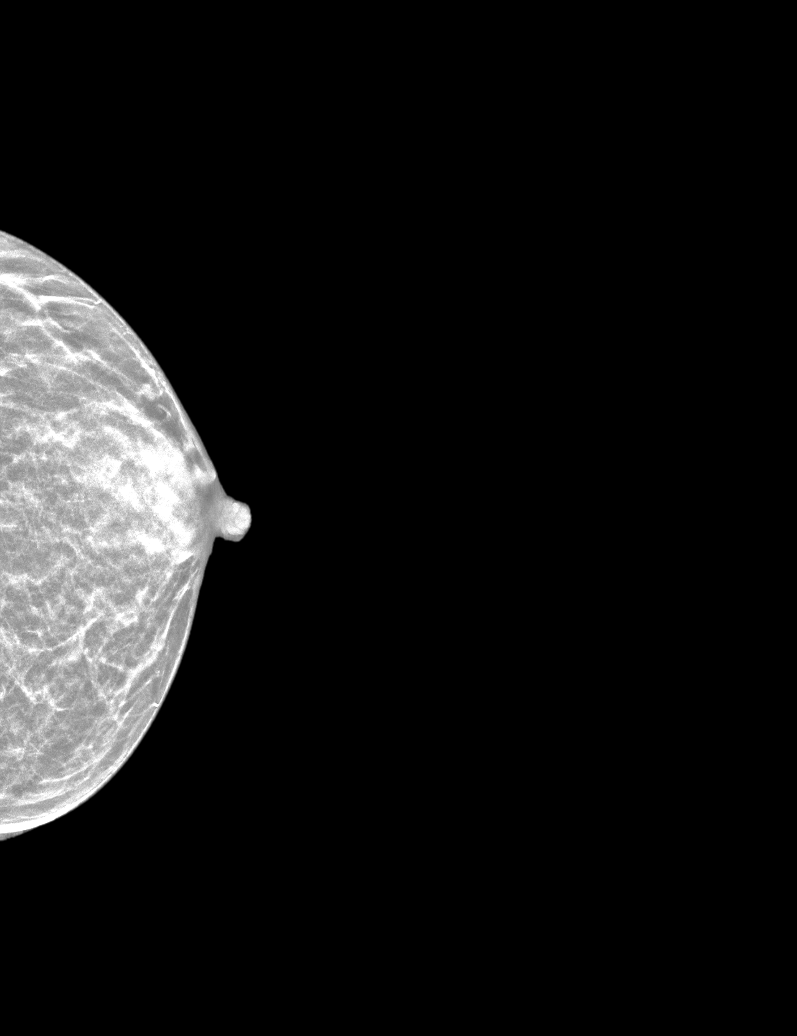

[R CC synth-2D (2 of 2)]
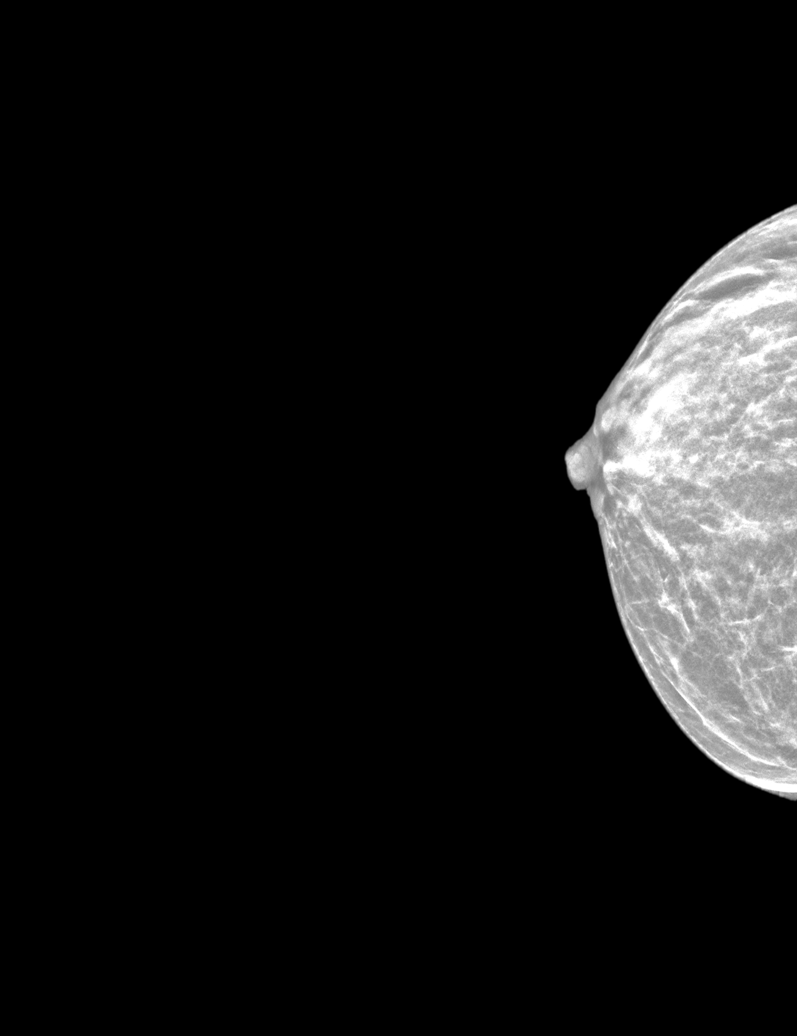

[R CC tomo · tomo slice 17/34.0]
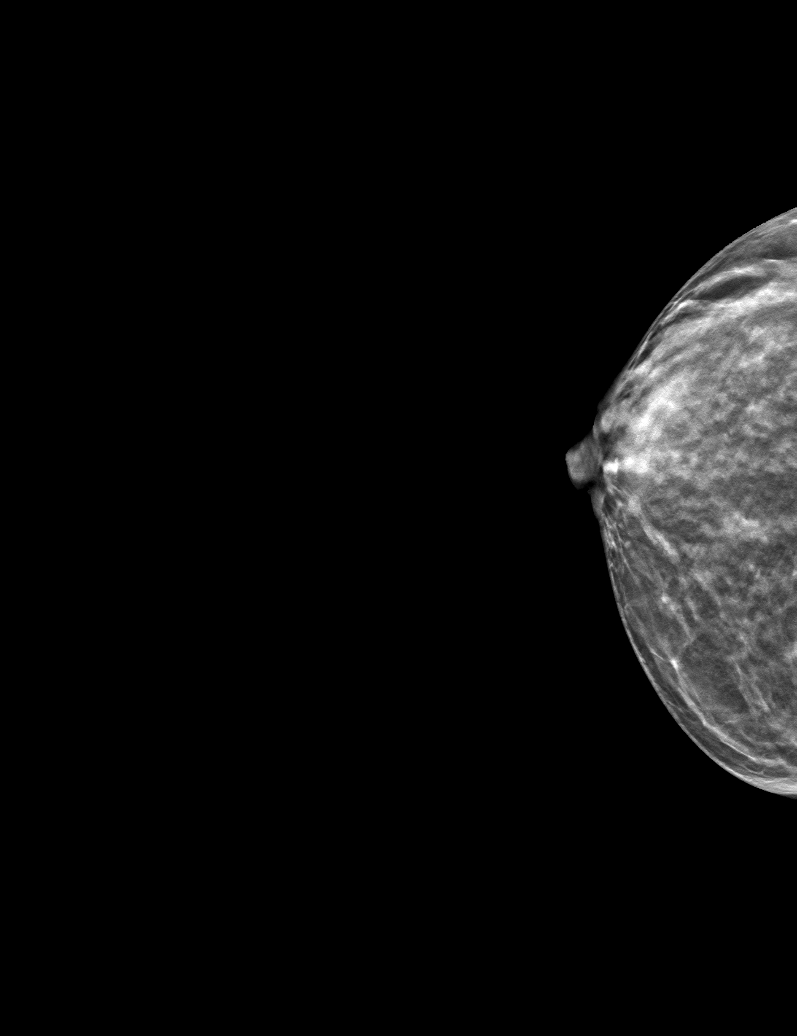

[6 of 30 positions shown; findings below may reference images not displayed]

ACR Breast Density Category c: The breast tissue is heterogeneously
dense, which may obscure small masses.
FINDINGS: There are no findings suspicious for malignancy. Images were
processed with CAD.
IMPRESSION: No mammographic evidence of malignancy. A result letter of this
screening mammogram will be mailed directly to the patient.

RECOMMENDATION:
Screening mammogram in one year. (Code:[id number]-[id number])

BI-RADS CATEGORY  1: Negative.

## 2020-11-02 ENCOUNTER — Encounter: Payer: Medicaid Other | Admitting: Student

## 2021-06-20 ENCOUNTER — Other Ambulatory Visit: Payer: Self-pay | Admitting: Family Medicine

## 2021-06-20 DIAGNOSIS — Z1231 Encounter for screening mammogram for malignant neoplasm of breast: Secondary | ICD-10-CM

## 2021-06-26 ENCOUNTER — Ambulatory Visit (INDEPENDENT_AMBULATORY_CARE_PROVIDER_SITE_OTHER): Payer: Medicaid Other | Admitting: Student

## 2021-06-26 ENCOUNTER — Other Ambulatory Visit: Payer: Self-pay

## 2021-06-26 ENCOUNTER — Other Ambulatory Visit (HOSPITAL_COMMUNITY)
Admission: RE | Admit: 2021-06-26 | Discharge: 2021-06-26 | Disposition: A | Discharge status: Home / Self Care | Payer: Medicaid Other | Source: Ambulatory Visit | Attending: Internal Medicine | Admitting: Internal Medicine

## 2021-06-26 VITALS — BP 131/86 | HR 97 | Temp 98.5°F | Ht 64.0 in | Wt 105.6 lb

## 2021-06-26 DIAGNOSIS — I10 Essential (primary) hypertension: Secondary | ICD-10-CM | POA: Diagnosis not present

## 2021-06-26 DIAGNOSIS — A64 Unspecified sexually transmitted disease: Secondary | ICD-10-CM | POA: Diagnosis not present

## 2021-06-26 DIAGNOSIS — F419 Anxiety disorder, unspecified: Secondary | ICD-10-CM | POA: Diagnosis not present

## 2021-06-26 DIAGNOSIS — J4520 Mild intermittent asthma, uncomplicated: Secondary | ICD-10-CM

## 2021-06-26 DIAGNOSIS — J309 Allergic rhinitis, unspecified: Secondary | ICD-10-CM

## 2021-06-26 DIAGNOSIS — Z720 Tobacco use: Secondary | ICD-10-CM

## 2021-06-26 DIAGNOSIS — F314 Bipolar disorder, current episode depressed, severe, without psychotic features: Secondary | ICD-10-CM

## 2021-06-26 DIAGNOSIS — Z0000 Encounter for general adult medical examination without abnormal findings: Secondary | ICD-10-CM | POA: Insufficient documentation

## 2021-06-26 DIAGNOSIS — E559 Vitamin D deficiency, unspecified: Secondary | ICD-10-CM

## 2021-06-26 DIAGNOSIS — N912 Amenorrhea, unspecified: Secondary | ICD-10-CM | POA: Diagnosis not present

## 2021-06-26 MED ORDER — QUETIAPINE FUMARATE 50 MG PO TABS: 150.0000 mg | ORAL_TABLET | Freq: Every day | ORAL | 0 refills | Status: DC

## 2021-06-26 MED ORDER — MONTELUKAST SODIUM 10 MG PO TABS: 10.0000 mg | ORAL_TABLET | Freq: Every day | ORAL | 0 refills | Status: DC

## 2021-06-26 MED ORDER — MOMETASONE FUROATE 50 MCG/ACT NA SUSP: 2.0000 | Freq: Every day | NASAL | 0 refills | Status: DC

## 2021-06-26 MED ORDER — MIRTAZAPINE 45 MG PO TABS: 45.0000 mg | ORAL_TABLET | Freq: Every day | ORAL | 0 refills | Status: DC

## 2021-06-26 MED ORDER — VITAMIN D (CHOLECALCIFEROL) 25 MCG (1000 UT) PO TABS: 1.0000 | ORAL_TABLET | Freq: Every day | ORAL | 2 refills | Status: DC

## 2021-06-26 MED ORDER — ALBUTEROL SULFATE HFA 108 (90 BASE) MCG/ACT IN AERS: 2.0000 | INHALATION_SPRAY | Freq: Four times a day (QID) | RESPIRATORY_TRACT | 12 refills | Status: DC | PRN

## 2021-06-26 MED ORDER — AMLODIPINE BESYLATE 5 MG PO TABS: 5.0000 mg | ORAL_TABLET | Freq: Every day | ORAL | 2 refills | Status: DC

## 2021-06-26 MED ORDER — HYDROXYZINE HCL 25 MG PO TABS: 25.0000 mg | ORAL_TABLET | Freq: Three times a day (TID) | ORAL | 0 refills | Status: DC | PRN

## 2021-06-26 NOTE — Assessment & Plan Note (Signed)
Assessment: Patient has been out of her allergy medicine for the past month.  We will reorder her montelukast and mometasone nasal spray.  Discussed that we will not be reordering her Zyrtec she is taking hydroxyzine 3 times daily.  Plan: -Reorder montelukast and mometasone -Discontinue cetirizine

## 2021-06-26 NOTE — Progress Notes (Signed)
   CC: Follow-up chronic medical conditions: Bipolar disorder, hypertension  HPI:  [name] is a 50 y.o. female with a past medical history stated below and presents today for follow-up of her chronic medical conditions, have Pap smear performed. Please see problem based assessment and plan for additional details.  Past Medical History:  Diagnosis Date   Anemia    Asthma    Bipolar disorder (HCC)    Cellulitis of right thumb 06/14/2016   Chronic bronchitis (HCC)    Hypertension    Mitral valve prolapse     Current Outpatient Medications on File Prior to Visit  Medication Sig Dispense Refill   ibuprofen (ADVIL,MOTRIN) 200 MG tablet Take 2 tablets (400 mg total) by mouth every 6 (six) hours as needed for mild pain. 30 tablet 0   No current facility-administered medications on file prior to visit.    Family History  Problem Relation Age of Onset   Breast cancer Paternal Aunt    Social history  Patient states she smokes 2 to 3 cigarettes/day.  Drinks 1-2 beers on the weekends.  Denies illicit substance use.  She has had 1 sexual partner for the past 5 years.  Patient lives with her family, recently moved in with them as she was unable to afford her monthly rent.  She states that she has many home stressors currently and is away from her children as well as granddaughter.  She feels safe at home.  Review of Systems: ROS negative except for what is noted on the assessment and plan.  Vitals:   06/26/21 1450  BP: 131/86  Pulse: 97  Temp: 98.5 F (36.9 C)  TempSrc: Oral  SpO2: 98%  Weight: 105 lb 9.6 oz (47.9 kg)  Height: 5\' 4"  (1.626 m)   Physical Exam: Constitutional: Well-appearing, no acute distress HENT: normocephalic atraumatic Eyes: conjunctiva non-erythematous Neck: supple Cardiovascular: regular rate and rhythm, no m/r/g Pulmonary/Chest: normal work of breathing on room air, lungs clear to auscultation bilaterally MSK: normal bulk and  tone Neurological: alert & oriented x 3, 5/5 strength in bilateral upper and lower extremities, normal gait Skin: warm and dry Psych: Tearful at times when discussing family stressors  GU: External genitalia without erythema, exudate or discharge. Vaginal vault is without discharge. Cervix is of normal color, erythematous lesion at the 8 o'clock position. The os is closed.  Attending Dr. and chaperone nursing staff present for vaginal exam and a Pap smear.  Tolerated the procedure well without any complications   Assessment & Plan:   See Encounters Tab for problem based charting.  Patient seen with Dr. Antony Contras, D.O. [facility], PGY-2 Pager: [phone], Phone: [phone] Date 06/26/2021 Time 6:38 PM

## 2021-06-26 NOTE — Patient Instructions (Signed)
Thank you, [name] for allowing [place] to provide your care today. Today we discussed   High blood pressure Please keep a blood pressure log please check your blood pressures 1-2 times a week when you are resting calmly in a chair.  Please make sure both feet are on the ground..  We will be restarting your home blood pressure medication of amlodipine.  We will be holding her lisinopril.  History of bipolar disorder and anxiety We will be restarting these medications as well as putting in a referral for counseling services.  Please follow-up with [facility] to see if you are able to see them again.  Otherwise I have placed a referral for our counseling services and they will help you find the right person to talk with.  Healthcare maintenance We performed a Pap smear today.  We will also be performing some blood work.  I will update you with the results.    I have ordered the following labs for you:   Lab Orders  CBC no Diff  CMP14 + Anion Gap  Follicle Stimulating Hormone  TSH  Beta HCG, Quant ([facility])     Referrals ordered today:    Referral Orders  Ambulatory referral to Gastroenterology  for colon cancer screening  I have ordered the following medication/changed the following medications:   Stop the following medications: Medications Discontinued During This Encounter  Medication Reason   cetirizine (ZYRTEC) 10 MG tablet    lisinopril (ZESTRIL) 40 MG tablet    hydrOXYzine (ATARAX/VISTARIL) 25 MG tablet Reorder   mirtazapine (REMERON) 45 MG tablet Reorder   QUEtiapine (SEROQUEL) 50 MG tablet Reorder   montelukast (SINGULAIR) 10 MG tablet Reorder   amLODipine (NORVASC) 5 MG tablet Reorder   albuterol (VENTOLIN HFA) 108 (90 Base) MCG/ACT inhaler Reorder   mometasone (NASONEX) 50 MCG/ACT nasal spray Reorder   Vitamin D, Cholecalciferol, 25 MCG (1000 UT) TABS Reorder     Start the following medications: Meds ordered this encounter  Medications   albuterol (VENTOLIN  HFA) 108 (90 Base) MCG/ACT inhaler    Sig: Inhale 2 puffs into the lungs every 6 (six) hours as needed for wheezing or shortness of breath.    Dispense:  8 g    Refill:  12   amLODipine (NORVASC) 5 MG tablet    Sig: Take 1 tablet (5 mg total) by mouth daily.    Dispense:  90 tablet    Refill:  2   hydrOXYzine (ATARAX/VISTARIL) 25 MG tablet    Sig: Take 1 tablet (25 mg total) by mouth every 8 (eight) hours as needed for anxiety.    Dispense:  30 tablet    Refill:  0   mirtazapine (REMERON) 45 MG tablet    Sig: Take 1 tablet (45 mg total) by mouth at bedtime.    Dispense:  30 tablet    Refill:  0   mometasone (NASONEX) 50 MCG/ACT nasal spray    Sig: Place 2 sprays into the nose daily.    Dispense:  17 g    Refill:  0   montelukast (SINGULAIR) 10 MG tablet    Sig: Take 1 tablet (10 mg total) by mouth daily.    Dispense:  30 tablet    Refill:  0   QUEtiapine (SEROQUEL) 50 MG tablet    Sig: Take 3 tablets (150 mg total) by mouth at bedtime.    Dispense:  90 tablet    Refill:  0   Vitamin D, Cholecalciferol, 25  MCG (1000 UT) TABS    Sig: Take 1 tablet by mouth daily.    Dispense:  90 tablet    Refill:  2     Follow up: 1 month     Should you have any questions or concerns please call the internal medicine clinic at [phone].     Thalia Bloodgood, D.O. [facility]

## 2021-06-26 NOTE — Assessment & Plan Note (Signed)
Assessment: Patient states that she has been off her mirtazapine as well as quite a pain for the past 6 weeks.  States that she is recently been followed by [facility] and when she called to ask about medication refills I recommended that she follow-up with her PCP.  We will refill the medications for 1 month, recommended patient call [facility] back to have an appointment scheduled.  Because I am uncertain what occurred, we will also place referral to our behavioral health team to assist patient in this matter.  Plan: -Refilled quetiapine and mirtazapine -Referral placed to behavioral health

## 2021-06-26 NOTE — Assessment & Plan Note (Signed)
Assessment: Patient with Hx of HTN. Current regimen of amlodipine 5 mg daily and lisinopril 40 mg daily.  Patient has been out of her medications for the past 6 weeks.  She states that home blood pressure readings have averaged around 200 systolically.  However today's clinic visit patient with blood pressure 131/86.  Will restart amlodipine 5 mg daily and have patient record blood pressure log.  Gave patient a paper log to fill out and bring back in at her next visit and also instructed her to bring her blood pressure cuff as well.  It appears this patient has frequent fluctuations in her blood pressure readings during her visits.  She may be salt sensitive.  May benefit from diuretic as opposed to ACE or ARB.  Will continue to monitor patient and have her return in 1 month with blood pressure readings 1-2 times per week to determine further medication treatment for her hypertension.  Plan: -Restart amlodipine 5 mg daily -Consider HCTZ at follow-up visit if additional antihypertensives needed

## 2021-06-26 NOTE — Assessment & Plan Note (Signed)
Assessment: Currently smoking 2 to 3 cigarettes/day.  Counseled on smoking cessation.  Patient states that she has had a lot of anxiety and has been out of her medications for 6 weeks and the tobacco helps calm her down.  Discussed that once we get her back on her medications to help with this anxiety we can continue to work on smoking cessation.  Plan: -Continue smoking counseling

## 2021-06-26 NOTE — Assessment & Plan Note (Addendum)
Assessment: Patient states that she has not had a menstrual cycle in the last 17 years.  States that since this time she has been going through menopause.  Discussed with patient because she has not not have a menstrual cycle in quite some time and her amenorrhea started when she was in her early 75s, we will initiate an amenorrhea work-up.  We will check thyroid studies as well as beta-hCG and FSH.  This may also be secondary to patient's antipsychotic however will rule out other cause initially.  Plan: -TSH, FSH, beta-hCG pending  Addendum:  TSH resulted slightly low, will add on free T4 and T3.  FSH within the postmenopausal range and beta-hCG of 1.  Free T4 and T3 normal. Patient fits category of subclinical hypothyroidism. Will continue to monitor.

## 2021-06-26 NOTE — Assessment & Plan Note (Addendum)
Assessment: Pap smear performed today.  Patient with cervical lesion at the 8 o'clock position.  HPV testing (HPV with any interpretation) ordered as well as wet prep for STI testing.  She denies history of abnormal Pap.  Patient has not also not had blood work in some time, will order CBC as well as CMP  Patient also of age for colonoscopy, and would like referral placed.  Plan: -Pending Pap results -CBC and CMP pending -GI referral placed for colonoscopy  Addendum Pap results returned, negative for HPV.  CBC unremarkable.  Patient with elevated AST, lower than has been in the past.  Continue to monitor.

## 2021-06-26 NOTE — Assessment & Plan Note (Signed)
Assessment: Patient with longstanding history of anxiety exacerbated by recent global pandemic.  Patient has been on hydroxyzine as needed for her anxiety as well as Remeron in the evening to help her sleep.  She has been out of her medications for the past 6 weeks and notes increased anxiety episodes.  Will reorder patient's medications.  Because she is on hydroxyzine we will asked patient to stop taking Zyrtec as possible anticholinergic side effects with increased antihistamine use  Plan: -Continue hydroxyzine every 8 hours and Remeron nightly

## 2021-06-27 DIAGNOSIS — A64 Unspecified sexually transmitted disease: Secondary | ICD-10-CM | POA: Insufficient documentation

## 2021-06-27 LAB — CBC
Hematocrit: 40.1 % (ref 34.0–46.6)
Hemoglobin: 13.3 g/dL (ref 11.1–15.9)
MCH: 31.1 pg (ref 26.6–33.0)
MCHC: 33.2 g/dL (ref 31.5–35.7)
MCV: 94 fL (ref 79–97)
Platelets: 367 10*3/uL (ref 150–450)
RBC: 4.28 x10E6/uL (ref 3.77–5.28)
RDW: 13.2 % (ref 11.7–15.4)
WBC: 5.9 10*3/uL (ref 3.4–10.8)

## 2021-06-27 LAB — CERVICOVAGINAL ANCILLARY ONLY
Bacterial Vaginitis (gardnerella): POSITIVE — AB
Candida Glabrata: NEGATIVE
Candida Vaginitis: NEGATIVE
Chlamydia: NEGATIVE
Comment: NEGATIVE
Comment: NEGATIVE
Comment: NEGATIVE
Comment: NEGATIVE
Comment: NEGATIVE
Comment: NORMAL
Neisseria Gonorrhea: NEGATIVE
Trichomonas: POSITIVE — AB

## 2021-06-27 LAB — HIV ANTIBODY (ROUTINE TESTING W REFLEX): HIV Screen 4th Generation wRfx: NONREACTIVE

## 2021-06-27 LAB — FOLLICLE STIMULATING HORMONE: FSH: 88.6 m[IU]/mL

## 2021-06-27 LAB — CMP14 + ANION GAP
ALT: 15 IU/L (ref 0–32)
AST: 46 IU/L — ABNORMAL HIGH (ref 0–40)
Albumin/Globulin Ratio: 1.7 (ref 1.2–2.2)
Albumin: 5.3 g/dL — ABNORMAL HIGH (ref 3.8–4.8)
Alkaline Phosphatase: 96 IU/L (ref 44–121)
Anion Gap: 26 mmol/L — ABNORMAL HIGH (ref 10.0–18.0)
BUN/Creatinine Ratio: 12 (ref 9–23)
BUN: 9 mg/dL (ref 6–24)
Bilirubin Total: 0.4 mg/dL (ref 0.0–1.2)
CO2: 21 mmol/L (ref 20–29)
Calcium: 10.1 mg/dL (ref 8.7–10.2)
Chloride: 97 mmol/L (ref 96–106)
Creatinine, Ser: 0.75 mg/dL (ref 0.57–1.00)
Globulin, Total: 3.1 g/dL (ref 1.5–4.5)
Glucose: 74 mg/dL (ref 65–99)
Potassium: 4.4 mmol/L (ref 3.5–5.2)
Sodium: 144 mmol/L (ref 134–144)
Total Protein: 8.4 g/dL (ref 6.0–8.5)
eGFR: 98 mL/min/{1.73_m2} (ref >59)

## 2021-06-27 LAB — BETA HCG QUANT (REF LAB): hCG Quant: 1 m[IU]/mL

## 2021-06-27 LAB — TSH: TSH: 0.433 u[IU]/mL — ABNORMAL LOW (ref 0.450–4.500)

## 2021-06-27 MED ORDER — METRONIDAZOLE 500 MG PO TABS: 500.0000 mg | ORAL_TABLET | Freq: Two times a day (BID) | ORAL | 0 refills | Status: AC

## 2021-06-27 NOTE — Assessment & Plan Note (Signed)
Assessment: Pap results positive for bacterial vaginosis as well as trichomonas.  We will treat with metronidazole 500 mg twice daily for 7 days.  Called patient to her inform her of these results but she did not pick up.  Voicemail was left we will try again later this afternoon.  Plan: -Metronidazole 500 mg twice daily for 7 days, end date of 07/26

## 2021-06-27 NOTE — Addendum Note (Signed)
Addended by: Belva Agee on: 06/27/2021 03:32 PM   Modules accepted: Orders

## 2021-06-27 NOTE — Progress Notes (Signed)
Internal Medicine Clinic Attending  Case discussed with Dr. Katsadouros  At the time of the visit.  We reviewed the resident's history and exam and pertinent patient test results.  I agree with the assessment, diagnosis, and plan of care documented in the resident's note.  

## 2021-06-27 NOTE — Addendum Note (Signed)
Addended by: Belva Agee on: 06/27/2021 09:25 AM   Modules accepted: Orders

## 2021-06-28 LAB — CYTOLOGY - PAP
Comment: NEGATIVE
Diagnosis: NEGATIVE
High risk HPV: NEGATIVE

## 2021-06-28 LAB — SPECIMEN STATUS REPORT

## 2021-06-28 LAB — T4, FREE: Free T4: 1.02 ng/dL (ref 0.82–1.77)

## 2021-06-28 LAB — T3: T3, Total: 107 ng/dL (ref 71–180)

## 2021-06-29 ENCOUNTER — Other Ambulatory Visit: Payer: Self-pay | Admitting: Student

## 2021-06-29 ENCOUNTER — Telehealth: Payer: Self-pay | Admitting: *Deleted

## 2021-06-29 MED ORDER — FLONASE SENSIMIST 27.5 MCG/SPRAY NA SUSP: 2.0000 | Freq: Every day | NASAL | 12 refills | Status: DC

## 2021-06-29 NOTE — Telephone Encounter (Signed)
PA for Mometasone (Nasonex).  Patient will need to try and fail 2 of the Preferred  meds Astepro, Azelastine, Fluticasone spray, Ipratropium spray or Olopatadine Nasal Spray.  Message to be sent to the Yellow Team to consider a change if appropriate.  Angelina Ok, RN 01/10/2021 11:49 AM.

## 2021-06-30 ENCOUNTER — Other Ambulatory Visit: Payer: Self-pay | Admitting: *Deleted

## 2021-07-17 ENCOUNTER — Ambulatory Visit: Payer: Medicaid Other | Admitting: Behavioral Health

## 2021-07-17 DIAGNOSIS — F331 Major depressive disorder, recurrent, moderate: Secondary | ICD-10-CM

## 2021-07-17 DIAGNOSIS — F419 Anxiety disorder, unspecified: Secondary | ICD-10-CM

## 2021-07-17 NOTE — BH Specialist Note (Signed)
Integrated Behavioral Health via Telemedicine Visit  07/17/2021 [name] [id number]  Number of Integrated Behavioral Health visits: 1/6 Session Start time: 10:00am  Session End time: 10:30am Total time: 30  Referring Provider: Dr. Charlotta Newton, DO Patient/Family location: Pt @ home in private [facility] Provider location: [facility] Office All persons participating in visit: Pt & Clinician Types of Service: Introduction only  I connected with [name] and/or [name]'s  self  via  Telephone or [employer]  (Video is Caregility application) and verified that I am speaking with the correct person using two identifiers. Discussed confidentiality: Yes   I discussed the limitations of telemedicine and the availability of in person appointments.  Discussed there is a possibility of technology failure and discussed alternative modes of communication if that failure occurs.  I discussed that engaging in this telemedicine visit, they consent to the provision of behavioral healthcare and the services will be billed under their insurance.  Patient and/or legal guardian expressed understanding and consented to Telemedicine visit: Yes   Presenting Concerns: Patient and/or family reports the following symptoms/concerns: elevated anx/dep due to housing insecurity Duration of problem: months; Severity of problem: mild  Patient and/or Family's Strengths/Protective Factors: Social connections, Social and Emotional competence, and Sense of purpose  Goals Addressed: Patient will:  Reduce symptoms of: anxiety, depression, and stress   Increase knowledge and/or ability of: coping skills, self-management skills, and stress reduction   Demonstrate ability to: Increase healthy adjustment to current life circumstances  Progress towards Goals: Estb'd today; Pt will accept ck-in calls once a month  Interventions: Interventions utilized:  Supportive  Counseling Standardized Assessments completed:  screeners prn  Patient and/or Family Response: Pt receptive to call today & agrees to future appts  Assessment: Patient currently experiencing elevated anx/dep due to housing insecurity.   Patient may benefit from monthly ck-ins via telehealth for support of mental health wellness.  Plan: Follow up with behavioral health clinician on : 2-3 wks on telehealth for wellness chk-in Behavioral recommendations: Keep a notebook of concerns for [place] to discuss as you traverse finding housing & caring for your Family. Referral(s): Integrated [employer] (In Clinic)  I discussed the assessment and treatment plan with the patient and/or parent/guardian. They were provided an opportunity to ask questions and all were answered. They agreed with the plan and demonstrated an understanding of the instructions.   They were advised to call back or seek an in-person evaluation if the symptoms worsen or if the condition fails to improve as anticipated.  Deneise Lever, LMFT

## 2021-07-27 ENCOUNTER — Other Ambulatory Visit: Payer: Self-pay | Admitting: Student

## 2021-07-27 DIAGNOSIS — F314 Bipolar disorder, current episode depressed, severe, without psychotic features: Secondary | ICD-10-CM

## 2021-07-28 ENCOUNTER — Other Ambulatory Visit: Payer: Self-pay | Admitting: Student

## 2021-07-28 DIAGNOSIS — J309 Allergic rhinitis, unspecified: Secondary | ICD-10-CM

## 2021-07-31 ENCOUNTER — Telehealth: Payer: Self-pay | Admitting: Student

## 2021-07-31 MED ORDER — KETOTIFEN FUMARATE 0.025 % OP SOLN: 1.0000 [drp] | Freq: Two times a day (BID) | OPHTHALMIC | 0 refills | Status: DC

## 2021-07-31 NOTE — Telephone Encounter (Signed)
Thanks Stacee. She needs to get in touch with [facility] and see them for future refills, I will send in another month supply. I'll reach out to Dr. Monna Fam as well.

## 2021-07-31 NOTE — Telephone Encounter (Signed)
Just let me know.

## 2021-07-31 NOTE — Telephone Encounter (Signed)
[name] called the clinic requesting refills on her quietapine and remeron. In the past she has followed with [facility] for her bipolar disorders and medications.  She notes that she is reach out to them multiple times and left a message for follow-up appointment but they have yet to call her back.  We have refilled her psych meds for the next month and I will reach out to Dr. Sallyanne Kuster to assist me in finding patient other psych providers that can help [place] refill these medications.  She also states that she has had increased allergies and watering of her eyes, she did not pick up her fluticasone inhaler and would like a refill on her Zaditor eyedrops.  We will reorder these today.

## 2021-07-31 NOTE — Telephone Encounter (Signed)
Yes, we discussed her re-visiting her [facility] Provider & she agreed. I will f/u on this later this week when we speak. I am just tiding her through-she knows you will not prescribe these meds Dr. Kirtland Bouchard-

## 2021-08-01 NOTE — Telephone Encounter (Signed)
I did not provide addt'l Referrals since she is estb'd w/[facility] & a bit complex, but I can give her others-I will call her today-Thank you!

## 2021-08-02 ENCOUNTER — Other Ambulatory Visit: Payer: Self-pay

## 2021-08-02 ENCOUNTER — Ambulatory Visit: Payer: Medicaid Other | Admitting: Behavioral Health

## 2021-08-02 DIAGNOSIS — F314 Bipolar disorder, current episode depressed, severe, without psychotic features: Secondary | ICD-10-CM

## 2021-08-02 NOTE — BH Specialist Note (Signed)
Integrated Behavioral Health via Telemedicine Visit  08/02/2021 NASIM GAROFANO [id number]  Number of Integrated Behavioral Health visits: 2/6 Session Start time: 11:30am  Session End time: 11:50am Total time: 20  Referring Provider: Dr. Charlotta Newton, DO Patient/Family location: Pt is staying w/a friend & has privacy [facility] Provider location: [facility] Office All persons participating in visit: Pt & Clinician Types of Service: Health Promotion  I connected with [name] and/or Alyson Locket Reiger's  self  via  Telephone or Engineer, civil (consulting)  (Video is Surveyor, mining) and verified that I am speaking with the correct person using two identifiers. Discussed confidentiality:  2nd visit  I discussed the limitations of telemedicine and the availability of in person appointments.  Discussed there is a possibility of technology failure and discussed alternative modes of communication if that failure occurs.  I discussed that engaging in this telemedicine visit, they consent to the provision of behavioral healthcare and the services will be billed under their insurance.  Patient and/or legal guardian expressed understanding and consented to Telemedicine visit:  2nd visit  Presenting Concerns: Patient and/or family reports the following symptoms/concerns: Pt is attempting to estb w/new Provider for her psychiatric medication needs & a Therapist Duration of problem: weeks now; Severity of problem: moderate  Patient and/or Family's Strengths/Protective Factors: Social connections, Social and Emotional competence, Concrete supports in place (healthy food, safe environments, etc.), and Sense of purpose  Goals Addressed: Patient will:  Reduce symptoms of: anxiety, depression, and stress   Increase knowledge and/or ability of: coping skills, self-management skills, and stress reduction   Demonstrate ability to: Increase healthy adjustment to current life circumstances,  Increase adequate support systems for patient/family, and Improve medication compliance  Progress towards Goals: Ongoing - Provided Pt w/explicit instructions for securing an Appt @ [facility] to secure Psych Eval/Assess & psychotherapy source.   Interventions: Interventions utilized:  Motivational Interviewing, Behavioral Activation, and Supportive Counseling Standardized Assessments completed:  screeners prn  Patient and/or Family Response: Pt receptive to call made today w/her consent for 11:30 telehealth. Pt agrees to future ck-in calls for mental health wellness.  Assessment: Patient currently experiencing inc'd anx/dep due to attempts to re-estb with a Psychiatric Provider for her psychopharmacological needs & consistent psychotherapy in the Community setting.   Patient may benefit from cont'd ck-in calls until she is able to estb w/New Provider.  Plan: Follow up with behavioral health clinician on : 2-3 wks on telehealth for 30 min Behavioral recommendations: Make call to [facility] Referral for OutPt appt to include Eval/Assess & medication mgmt Referral(s): Integrated Art gallery manager (In Clinic) and Community Resources:  Psych Eval/Assess  I discussed the assessment and treatment plan with the patient and/or parent/guardian. They were provided an opportunity to ask questions and all were answered. They agreed with the plan and demonstrated an understanding of the instructions.   They were advised to call back or seek an in-person evaluation if the symptoms worsen or if the condition fails to improve as anticipated.  Deneise Lever, LMFT

## 2021-08-03 ENCOUNTER — Ambulatory Visit: Payer: Medicaid Other | Admitting: Behavioral Health

## 2021-08-11 ENCOUNTER — Ambulatory Visit: Payer: Medicaid Other

## 2021-08-24 ENCOUNTER — Telehealth: Payer: Self-pay | Admitting: Behavioral Health

## 2021-08-24 ENCOUNTER — Ambulatory Visit: Payer: Medicaid Other | Admitting: Behavioral Health

## 2021-08-24 NOTE — Telephone Encounter (Signed)
Contacted Pt for telehealth visit today. Lft msg for Pt to r/s @ her convenience.  Dr. Monna Fam

## 2021-10-31 ENCOUNTER — Encounter: Payer: Self-pay | Admitting: Gastroenterology

## 2021-12-20 ENCOUNTER — Ambulatory Visit
Admission: RE | Admit: 2021-12-20 | Discharge: 2021-12-20 | Disposition: A | Discharge status: Home / Self Care | Payer: Medicaid Other | Source: Ambulatory Visit | Attending: Family Medicine | Admitting: Family Medicine

## 2021-12-20 DIAGNOSIS — Z1231 Encounter for screening mammogram for malignant neoplasm of breast: Secondary | ICD-10-CM

## 2021-12-21 ENCOUNTER — Ambulatory Visit (AMBULATORY_SURGERY_CENTER): Payer: Medicaid Other

## 2021-12-21 VITALS — Ht 64.0 in | Wt 120.0 lb

## 2021-12-21 DIAGNOSIS — T7840XA Allergy, unspecified, initial encounter: Secondary | ICD-10-CM

## 2021-12-21 DIAGNOSIS — Z1211 Encounter for screening for malignant neoplasm of colon: Secondary | ICD-10-CM

## 2021-12-21 HISTORY — DX: Allergy, unspecified, initial encounter: T78.40XA

## 2021-12-21 MED ORDER — ONDANSETRON HCL 4 MG PO TABS: 4.0000 mg | ORAL_TABLET | ORAL | 0 refills | Status: DC

## 2021-12-21 MED ORDER — NA SULFATE-K SULFATE-MG SULF 17.5-3.13-1.6 GM/177ML PO SOLN: 1.0000 | Freq: Once | ORAL | 0 refills | Status: AC

## 2021-12-21 NOTE — Progress Notes (Signed)
No egg or soy allergy known to patient  Pt has had no past sedation or  any surgeries or procedures Patient has never been intubated.   No FH of Malignant Hyperthermia Pt is not on diet pills Pt is not on  home 02  Pt is not on blood thinners  Pt denies issues with constipation  No A fib or A flutter  VIRTUAL PREVISIT  Suprep used, medicaid pt.    NO PA's for preps discussed with pt In PV today  Discussed with pt there will be an out-of-pocket cost for prep and that varies from $0 to 70 +  dollars - pt verbalized understanding   Due to the COVID-19 pandemic we are asking patients to follow certain guidelines in PV and the [facility]   Pt aware of COVID protocols and [facility] guidelines   PV completed over the phone. Pt verified name, DOB, address and insurance during PV today.  Pt mailed instruction packet with copy of consent form to read and not return, and instructions.  Pt encouraged to call with questions or issues.  If pt has My chart, procedure instructions sent via My Chart

## 2021-12-28 ENCOUNTER — Other Ambulatory Visit: Payer: Self-pay | Admitting: Student

## 2021-12-28 DIAGNOSIS — I10 Essential (primary) hypertension: Secondary | ICD-10-CM

## 2021-12-28 DIAGNOSIS — J309 Allergic rhinitis, unspecified: Secondary | ICD-10-CM

## 2021-12-28 DIAGNOSIS — Z1211 Encounter for screening for malignant neoplasm of colon: Secondary | ICD-10-CM

## 2021-12-28 DIAGNOSIS — F314 Bipolar disorder, current episode depressed, severe, without psychotic features: Secondary | ICD-10-CM

## 2021-12-28 DIAGNOSIS — J4520 Mild intermittent asthma, uncomplicated: Secondary | ICD-10-CM

## 2021-12-28 DIAGNOSIS — E559 Vitamin D deficiency, unspecified: Secondary | ICD-10-CM

## 2021-12-28 NOTE — Telephone Encounter (Signed)
Next appt scheduled 01/01/22 with Dr Laural Golden.

## 2021-12-28 NOTE — Telephone Encounter (Signed)
Refill Request- patient states she has moved recently and has packed away her medications in storage and she can not get to them and is requesting all of her Meds to be refilled if possible.   Please call the patient is any questions:  albuterol (VENTOLIN HFA) 108 (90 Base) MCG/ACT inhaler amLODipine (NORVASC) 5 MG tablet fluticasone (FLONASE SENSIMIST) 27.5 MCG/SPRAY nasal spray hydrOXYzine (ATARAX/VISTARIL) 25 MG tablet ibuprofen (ADVIL,MOTRIN) 200 MG tablet ketotifen (ZADITOR) 0.025 % ophthalmic solution mirtazapine (REMERON) 45 MG tablet montelukast (SINGULAIR) 10 MG tablet Multiple Vitamins-Minerals (ONE-A-DAY 50 PLUS PO) ondansetron (ZOFRAN) 4 MG tablet QUEtiapine (SEROQUEL) 50 MG tablet Vitamin D, Cholecalciferol, 25 MCG (1000 UT) TABS    Please use the following pharmacy:   [facility]  [address] [place], [place], [place] [address]  ~1.5 mi [phone]

## 2022-01-01 ENCOUNTER — Ambulatory Visit (INDEPENDENT_AMBULATORY_CARE_PROVIDER_SITE_OTHER): Payer: Medicaid Other | Admitting: Internal Medicine

## 2022-01-01 ENCOUNTER — Encounter: Payer: Self-pay | Admitting: Internal Medicine

## 2022-01-01 ENCOUNTER — Other Ambulatory Visit: Payer: Self-pay

## 2022-01-01 VITALS — BP 146/96 | HR 73 | Temp 98.6°F | Ht 64.0 in | Wt 117.7 lb

## 2022-01-01 DIAGNOSIS — F419 Anxiety disorder, unspecified: Secondary | ICD-10-CM | POA: Diagnosis not present

## 2022-01-01 DIAGNOSIS — F314 Bipolar disorder, current episode depressed, severe, without psychotic features: Secondary | ICD-10-CM

## 2022-01-01 DIAGNOSIS — R7989 Other specified abnormal findings of blood chemistry: Secondary | ICD-10-CM | POA: Diagnosis not present

## 2022-01-01 DIAGNOSIS — J309 Allergic rhinitis, unspecified: Secondary | ICD-10-CM | POA: Diagnosis not present

## 2022-01-01 DIAGNOSIS — E785 Hyperlipidemia, unspecified: Secondary | ICD-10-CM | POA: Diagnosis not present

## 2022-01-01 DIAGNOSIS — E559 Vitamin D deficiency, unspecified: Secondary | ICD-10-CM

## 2022-01-01 DIAGNOSIS — I10 Essential (primary) hypertension: Secondary | ICD-10-CM | POA: Diagnosis not present

## 2022-01-01 MED ORDER — AMLODIPINE BESYLATE 5 MG PO TABS: 5.0000 mg | ORAL_TABLET | Freq: Every day | ORAL | 2 refills | Status: DC

## 2022-01-01 MED ORDER — QUETIAPINE FUMARATE 50 MG PO TABS: ORAL_TABLET | ORAL | 0 refills | Status: DC

## 2022-01-01 MED ORDER — MIRTAZAPINE 45 MG PO TABS: ORAL_TABLET | ORAL | 0 refills | Status: DC

## 2022-01-01 MED ORDER — KETOTIFEN FUMARATE 0.025 % OP SOLN: 1.0000 [drp] | Freq: Two times a day (BID) | OPHTHALMIC | 0 refills | Status: AC

## 2022-01-01 MED ORDER — HYDROXYZINE HCL 25 MG PO TABS: 25.0000 mg | ORAL_TABLET | Freq: Three times a day (TID) | ORAL | 0 refills | Status: DC | PRN

## 2022-01-01 MED ORDER — MONTELUKAST SODIUM 10 MG PO TABS: ORAL_TABLET | ORAL | 0 refills | Status: DC

## 2022-01-01 NOTE — Assessment & Plan Note (Addendum)
-   Follow-up vitamin D level   ADDENDUM:  Vit D level 8, called and discussed with patient.  Sent in a prescription for vitamin D 50,000 units once a week for 5 weeks.  We will recheck vitamin D in about 6 weeks.

## 2022-01-01 NOTE — Assessment & Plan Note (Addendum)
Previous CMP showed minimally elevated AST.  Recheck today.   ADDENDUM: Alkaline phosphatase remains elevated minimally at 134.  Unclear etiology at this point, consider repeating at next visit and can consider GGT as well.

## 2022-01-01 NOTE — Progress Notes (Signed)
° °  CC: HTN  HPI:  [name] is a 51 y.o. with a past medical history listed below presenting for follow-up evaluation of her blood pressure. For details of today's visit and the status of his chronic medical issues please refer to the assessment and plan.   Past Medical History:  Diagnosis Date   Allergy 12/21/2021   Pollen   Anemia    Anxiety    Asthma    Bipolar disorder (HCC)    Cellulitis of right thumb 06/14/2016   Chronic bronchitis (HCC)    Hypertension    Mitral valve prolapse    Review of Systems:   Review of Systems  Eyes:  Negative for blurred vision.  Respiratory:  Negative for shortness of breath.   Cardiovascular:  Negative for chest pain.  Gastrointestinal:  Negative for abdominal pain, nausea and vomiting.  Neurological:  Negative for weakness and headaches.    Physical Exam:  Vitals:   01/01/22 1036  BP: (!) 146/96  Pulse: 73  Temp: 98.6 F (37 C)  TempSrc: Oral  SpO2: 100%  Weight: 117 lb 11.2 oz (53.4 kg)  Height: 5\' 4"  (1.626 m)    Physical Exam General: alert, appears stated age, in no acute distress HEENT: Normocephalic, atraumatic, EOM intact, conjunctiva normal CV: Regular rate and rhythm, no murmurs rubs or gallops Pulm: Clear to auscultation bilaterally, normal work of breathing Abdomen: Soft, nondistended, bowel sounds present, no tenderness to palpation MSK: No lower extremity edema Skin: Warm and dry Neuro: Alert and oriented x3   Assessment & Plan:   See Encounters Tab for problem based charting.  Patient discussed with Dr. 

## 2022-01-01 NOTE — Patient Instructions (Addendum)
It was a pleasure meeting you today!  I have sent in all of your refills.  Encourage you to get your colonoscopy this Thursday.  I am checking some blood work today and we will call you if the results are abnormal.  Lets follow-up in about 1-2 months for blood pressure check.

## 2022-01-01 NOTE — Assessment & Plan Note (Signed)
Vitals:   01/01/22 1036  BP: (!) 146/96  Pulse: 73  Temp: 98.6 F (37 C)  SpO2: 100%   Blood pressure elevated today however patient has not taken amlodipine for the past several days.  States that she asked for change of pharmacy for her refills but this was never sent.  Denies any chest pain, headaches, shortness of breath, changes in her visions or leg swelling.  -Refill amlodipine 5 mg -Follow-up in 1 to 2 months for blood pressure check

## 2022-01-01 NOTE — Assessment & Plan Note (Signed)
Patient requesting refills of her medications.  States that she is wanting to reestablish with [facility] but is having difficulty with transportation needs.  Informed her that she can check with her insurance to see if they can help arrange transportation to and from her doctors appointments.  Refilled Seroquel and mirtazapine for one month.

## 2022-01-01 NOTE — Assessment & Plan Note (Addendum)
Refilled patient's hydroxyzine and Remeron.  Stressed the importance of her reestablishing with [facility].  Patient states speaking with Dr. Monna Fam has helped her in the past.  Will refer again.

## 2022-01-02 LAB — CMP14 + ANION GAP
ALT: 13 IU/L (ref 0–32)
AST: 22 IU/L (ref 0–40)
Albumin/Globulin Ratio: 1.6 (ref 1.2–2.2)
Albumin: 4.9 g/dL — ABNORMAL HIGH (ref 3.8–4.8)
Alkaline Phosphatase: 134 IU/L — ABNORMAL HIGH (ref 44–121)
Anion Gap: 17 mmol/L (ref 10.0–18.0)
BUN/Creatinine Ratio: 12 (ref 9–23)
BUN: 10 mg/dL (ref 6–24)
Bilirubin Total: 0.2 mg/dL (ref 0.0–1.2)
CO2: 22 mmol/L (ref 20–29)
Calcium: 10.3 mg/dL — ABNORMAL HIGH (ref 8.7–10.2)
Chloride: 101 mmol/L (ref 96–106)
Creatinine, Ser: 0.82 mg/dL (ref 0.57–1.00)
Globulin, Total: 3 g/dL (ref 1.5–4.5)
Glucose: 95 mg/dL (ref 70–99)
Potassium: 4.1 mmol/L (ref 3.5–5.2)
Sodium: 140 mmol/L (ref 134–144)
Total Protein: 7.9 g/dL (ref 6.0–8.5)
eGFR: 88 mL/min/{1.73_m2} (ref >59)

## 2022-01-02 LAB — VITAMIN D 25 HYDROXY (VIT D DEFICIENCY, FRACTURES): Vit D, 25-Hydroxy: 8.6 ng/mL — ABNORMAL LOW (ref 30.0–100.0)

## 2022-01-03 ENCOUNTER — Telehealth: Payer: Self-pay | Admitting: Gastroenterology

## 2022-01-03 ENCOUNTER — Encounter: Payer: Self-pay | Admitting: Student

## 2022-01-03 MED ORDER — VITAMIN D (ERGOCALCIFEROL) 1.25 MG (50000 UNIT) PO CAPS
50000.0000 [IU] | ORAL_CAPSULE | ORAL | 0 refills | Status: DC
Start: 2022-01-03 — End: 2022-02-05

## 2022-01-03 NOTE — Addendum Note (Signed)
Addended by: Jaci Standard on: 01/03/2022 01:37 PM   Modules accepted: Orders

## 2022-01-03 NOTE — Telephone Encounter (Signed)
Good morning Dr. Lavon Paganini, patient called stating they are currently out of town so they requested for procedure scheduled for 01/04/22 to be canceled.  They will call at a later date to reschedule.

## 2022-01-03 NOTE — Telephone Encounter (Signed)
Ok, please charge late cancellation given patient didn't inform us earlier

## 2022-01-04 ENCOUNTER — Encounter: Payer: Medicaid Other | Admitting: Gastroenterology

## 2022-01-04 NOTE — Progress Notes (Signed)
Internal Medicine Clinic Attending ° °Case discussed with Dr. Rehman  At the time of the visit.  We reviewed the resident’s history and exam and pertinent patient test results.  I agree with the assessment, diagnosis, and plan of care documented in the resident’s note.  ° °

## 2022-01-04 NOTE — Addendum Note (Signed)
Addended by: Jaci Standard on: 01/04/2022 01:44 PM   Modules accepted: Level of Service

## 2022-01-05 MED ORDER — ALBUTEROL SULFATE HFA 108 (90 BASE) MCG/ACT IN AERS: 2.0000 | INHALATION_SPRAY | Freq: Four times a day (QID) | RESPIRATORY_TRACT | 12 refills | Status: DC | PRN

## 2022-01-05 MED ORDER — VITAMIN D (CHOLECALCIFEROL) 25 MCG (1000 UT) PO TABS: 1.0000 | ORAL_TABLET | Freq: Every day | ORAL | 2 refills | Status: DC

## 2022-01-05 MED ORDER — FLONASE SENSIMIST 27.5 MCG/SPRAY NA SUSP: 2.0000 | Freq: Every day | NASAL | 12 refills | Status: AC | PRN

## 2022-01-24 ENCOUNTER — Ambulatory Visit: Payer: Medicaid Other | Admitting: Behavioral Health

## 2022-01-24 DIAGNOSIS — F314 Bipolar disorder, current episode depressed, severe, without psychotic features: Secondary | ICD-10-CM

## 2022-01-24 NOTE — BH Specialist Note (Signed)
Integrated Behavioral Health via Telemedicine Visit  01/24/2022 AZAYLIA SKENANDORE ZG:[id number]  Number of [facility] Clinician visits: 3/6 Session Start time: 10:00am Session End time: 10:40am Total time in minutes: 40 min   Referring Provider: Dr. Laural Golden, DO Patient/Family location: Pt is home, living w/Dad [facility] Provider location: [facility] Office All persons participating in visit: Pt & Clinician Types of Service: Individual psychotherapy  I connected with Augustina Mood and/or Dellia Cloud Nam's  self  via  Telephone or Geologist, engineering  (Video is Tree surgeon) and verified that I am speaking with the correct person using two identifiers. Discussed confidentiality: Yes   I discussed the limitations of telemedicine and the availability of in person appointments.  Discussed there is a possibility of technology failure and discussed alternative modes of communication if that failure occurs.  I discussed that engaging in this telemedicine visit, they consent to the provision of behavioral healthcare and the services will be billed under their insurance.  Patient and/or legal guardian expressed understanding and consented to Telemedicine visit: Yes   Presenting Concerns: Patient and/or family reports the following symptoms/concerns: Pt has been seeking housing now for 8 mos. She is living w/her Dad & although she appreciates his generosity, she wants her own residence. Pt is determined to get her mental health stable & is trying to get an appt w/[facility]. If this does not work, Pt will use the info mailed to her today to f/u on appt @ [facility]. Duration of problem: months; Severity of problem: moderate  Patient and/or Family's Strengths/Protective Factors: Social and Emotional competence, Concrete supports in place (healthy food, safe environments, etc.), and Sense of purpose  Goals Addressed: Patient will:  Reduce symptoms of: anxiety, depression,  stress, and unstable independent housing as [facility] is only serving the 55+ age group @ present    Increase knowledge and/or ability of: coping skills and stress reduction   Demonstrate ability to: Increase healthy adjustment to current life circumstances and Increase motivation to adhere to plan of care  Progress towards Goals: Ongoing  Interventions: Interventions utilized:  Behavioral Activation Standardized Assessments completed:  screeners prn  Keep tracking your progress using your notebook to remind you of all you get accomplished & things you need to pay attn to for your health.   Patient and/or Family Response: Pt is receptive to call today & grateful for Clinician's time. Pt requests future appt.  Assessment: Patient currently experiencing elevated concerns for securing independent housing for herself outside the Parental home w/her Dad.   Patient may benefit from cont'd efforts to get herself back on track in several aspects of her mental & medical aspects of her life.  Plan: Follow up with behavioral health clinician on : Pt wants to coordinate her next BP ck @ [facility] w/a f:f visit w/Clinician Behavioral recommendations: Stick to your plan for making life work. Rely on your resources for going to the Grocery w/your Cousin & staying connected to your Dad.  Referral(s): [facility] (In Clinic) and [facility] sent w/instructions for scheduling  I discussed the assessment and treatment plan with the patient and/or parent/guardian. They were provided an opportunity to ask questions and all were answered. They agreed with the plan and demonstrated an understanding of the instructions.   They were advised to call back or seek an in-person evaluation if the symptoms worsen or if the condition fails to improve as anticipated.  Donnetta Hutching, LMFT

## 2022-01-28 ENCOUNTER — Other Ambulatory Visit: Payer: Self-pay | Admitting: Internal Medicine

## 2022-01-28 DIAGNOSIS — F314 Bipolar disorder, current episode depressed, severe, without psychotic features: Secondary | ICD-10-CM

## 2022-01-30 ENCOUNTER — Other Ambulatory Visit: Payer: Self-pay | Admitting: Internal Medicine

## 2022-01-30 DIAGNOSIS — J309 Allergic rhinitis, unspecified: Secondary | ICD-10-CM

## 2022-02-05 ENCOUNTER — Other Ambulatory Visit: Payer: Self-pay | Admitting: Internal Medicine

## 2022-02-05 DIAGNOSIS — E559 Vitamin D deficiency, unspecified: Secondary | ICD-10-CM

## 2022-04-07 ENCOUNTER — Other Ambulatory Visit: Payer: Self-pay | Admitting: Student

## 2022-04-07 DIAGNOSIS — J309 Allergic rhinitis, unspecified: Secondary | ICD-10-CM

## 2022-04-13 ENCOUNTER — Other Ambulatory Visit: Payer: Self-pay | Admitting: Student

## 2022-04-13 DIAGNOSIS — E559 Vitamin D deficiency, unspecified: Secondary | ICD-10-CM

## 2022-06-15 ENCOUNTER — Encounter (HOSPITAL_COMMUNITY): Payer: Self-pay

## 2022-06-15 ENCOUNTER — Emergency Department (HOSPITAL_COMMUNITY)
Admission: EM | Admit: 2022-06-15 | Discharge: 2022-06-16 | Disposition: A | Discharge status: Home / Self Care | Payer: Medicaid Other | Attending: Emergency Medicine | Admitting: Emergency Medicine

## 2022-06-15 ENCOUNTER — Other Ambulatory Visit: Payer: Self-pay

## 2022-06-15 DIAGNOSIS — Z79899 Other long term (current) drug therapy: Secondary | ICD-10-CM | POA: Insufficient documentation

## 2022-06-15 DIAGNOSIS — K1120 Sialoadenitis, unspecified: Secondary | ICD-10-CM

## 2022-06-15 DIAGNOSIS — R221 Localized swelling, mass and lump, neck: Secondary | ICD-10-CM | POA: Diagnosis not present

## 2022-06-15 DIAGNOSIS — K0889 Other specified disorders of teeth and supporting structures: Secondary | ICD-10-CM | POA: Diagnosis not present

## 2022-06-15 LAB — CBC WITH DIFFERENTIAL/PLATELET
Abs Immature Granulocytes: 0.03 10*3/uL (ref 0.00–0.07)
Basophils Absolute: 0.1 10*3/uL (ref 0.0–0.1)
Basophils Relative: 1 %
Eosinophils Absolute: 0.1 10*3/uL (ref 0.0–0.5)
Eosinophils Relative: 0 %
HCT: 42.4 % (ref 36.0–46.0)
Hemoglobin: 13.9 g/dL (ref 12.0–15.0)
Immature Granulocytes: 0 %
Lymphocytes Relative: 38 %
Lymphs Abs: 4.7 10*3/uL — ABNORMAL HIGH (ref 0.7–4.0)
MCH: 29.6 pg (ref 26.0–34.0)
MCHC: 32.8 g/dL (ref 30.0–36.0)
MCV: 90.4 fL (ref 80.0–100.0)
Monocytes Absolute: 1.1 10*3/uL — ABNORMAL HIGH (ref 0.1–1.0)
Monocytes Relative: 9 %
Neutro Abs: 6.3 10*3/uL (ref 1.7–7.7)
Neutrophils Relative %: 52 %
Platelets: 290 10*3/uL (ref 150–400)
RBC: 4.69 MIL/uL (ref 3.87–5.11)
RDW: 15.3 % (ref 11.5–15.5)
WBC: 12.2 10*3/uL — ABNORMAL HIGH (ref 4.0–10.5)
nRBC: 0 % (ref 0.0–0.2)

## 2022-06-15 LAB — BASIC METABOLIC PANEL
Anion gap: 16 — ABNORMAL HIGH (ref 5–15)
BUN: <5 mg/dL — ABNORMAL LOW (ref 6–20)
CO2: 24 mmol/L (ref 22–32)
Calcium: 10.3 mg/dL (ref 8.9–10.3)
Chloride: 98 mmol/L (ref 98–111)
Creatinine, Ser: 0.89 mg/dL (ref 0.44–1.00)
GFR, Estimated: >60 mL/min (ref >60)
Glucose, Bld: 144 mg/dL — ABNORMAL HIGH (ref 70–99)
Potassium: 3.4 mmol/L — ABNORMAL LOW (ref 3.5–5.1)
Sodium: 138 mmol/L (ref 135–145)

## 2022-06-15 MED ORDER — OXYCODONE-ACETAMINOPHEN 5-325 MG PO TABS
2.0000 | ORAL_TABLET | Freq: Once | ORAL | Status: AC
  Administered 2022-06-15: 2 via ORAL
  Filled 2022-06-15: qty 2

## 2022-06-15 MED ORDER — ONDANSETRON HCL 4 MG/2ML IJ SOLN: 4.0000 mg | Freq: Once | INTRAMUSCULAR | Status: DC

## 2022-06-15 NOTE — ED Triage Notes (Signed)
Pt arrived POV from home c/o dental pain on the right lower side since Tuesday. Pt went to the dentist to have the tooth removed today but they told her d/t to the swelling and the fact that her BP was high she would need to come to the ED.

## 2022-06-15 NOTE — ED Provider Triage Note (Signed)
Emergency Medicine Provider Triage Evaluation Note  [name] , a 51 y.o. female  was evaluated in triage.  Pt complains of dental pain. Patient went to the dentist to have a dental extraction but was sent here due to hypertension.  She was noted to be febrile here.  She complains of severe pain and swelling in the right lower jaw.  She is also having difficulty swallowing.  Patient is a daily smoker.  Review of Systems  Positive: Dental pain Negative: Shortness of breath  Physical Exam  BP (!) 172/137 (BP Location: Right Arm)   Pulse (!) 113   Temp (!) 101.1 F (38.4 C) (Oral)   Resp 20   Ht 5\' 4"  (1.626 m)   Wt 56.2 kg   SpO2 99%   BMI 21.28 kg/m  Gen:   Awake, no distress   Resp:  Normal effort  MSK:   Moves extremities without difficulty  Other:  Port intention, obvious swelling under the angle of the mandible  Medical Decision Making  Medically screening exam initiated at 5:53 PM.  Appropriate orders placed.  [name] was informed that the remainder of the evaluation will be completed by another provider, this initial triage assessment does not replace that evaluation, and the importance of remaining in the ED until their evaluation is complete.  Pain addressed, imaging and labs ordered.   Laurie Chime, PA-C 06/15/22 1754

## 2022-06-16 ENCOUNTER — Emergency Department (HOSPITAL_COMMUNITY): Payer: Medicaid Other

## 2022-06-16 ENCOUNTER — Other Ambulatory Visit: Payer: Self-pay | Admitting: Gastroenterology

## 2022-06-16 DIAGNOSIS — Z1211 Encounter for screening for malignant neoplasm of colon: Secondary | ICD-10-CM

## 2022-06-16 DIAGNOSIS — R221 Localized swelling, mass and lump, neck: Secondary | ICD-10-CM | POA: Diagnosis not present

## 2022-06-16 MED ORDER — OXYCODONE-ACETAMINOPHEN 5-325 MG PO TABS: 1.0000 | ORAL_TABLET | Freq: Three times a day (TID) | ORAL | 0 refills | Status: DC | PRN

## 2022-06-16 MED ORDER — OXYCODONE-ACETAMINOPHEN 5-325 MG PO TABS
2.0000 | ORAL_TABLET | Freq: Once | ORAL | Status: AC
  Administered 2022-06-16: 2 via ORAL
  Filled 2022-06-16: qty 2

## 2022-06-16 MED ORDER — CLINDAMYCIN HCL 150 MG PO CAPS
300.0000 mg | ORAL_CAPSULE | Freq: Once | ORAL | Status: AC
  Administered 2022-06-16: 300 mg via ORAL
  Filled 2022-06-16: qty 2

## 2022-06-16 MED ORDER — IOHEXOL 300 MG/ML  SOLN
75.0000 mL | Freq: Once | INTRAMUSCULAR | Status: AC | PRN
  Administered 2022-06-16: 75 mL via INTRAVENOUS

## 2022-06-16 MED ORDER — CLINDAMYCIN HCL 300 MG PO CAPS: 300.0000 mg | ORAL_CAPSULE | Freq: Four times a day (QID) | ORAL | 0 refills | Status: DC

## 2022-06-16 MED ORDER — KETOROLAC TROMETHAMINE 60 MG/2ML IM SOLN
15.0000 mg | Freq: Once | INTRAMUSCULAR | Status: AC
Start: 2022-06-16 — End: 2022-06-16
  Administered 2022-06-16: 15 mg via INTRAMUSCULAR
  Filled 2022-06-16: qty 2

## 2022-06-16 NOTE — ED Provider Notes (Signed)
[facility] EMERGENCY DEPARTMENT Provider Note   CSN: [id number] Arrival date & time: 06/15/22  1651     History  Chief Complaint  Patient presents with   Dental Pain    [name] is a 51 y.o. female.  51 year old female who presents the ER today secondary to jaw pain.  Patient states that she was having some dental pain for approximately 3 days.  She would have this morning the right lower jaw was swollen so she went to the dentist.  He just checked her blood pressure was 171/122 so they suggested to the ER immediately can do them to help her.  She states that she tried some Advil without help.  No fevers.  No trouble swallowing.  No trouble speaking.   Dental Pain      Home Medications Prior to Admission medications   Medication Sig Start Date End Date Taking? Authorizing Provider  clindamycin (CLEOCIN) 300 MG capsule Take 1 capsule (300 mg total) by mouth 4 (four) times daily. X 7 days 06/16/22  Yes Sanjeev Main, Barbara Cower, MD  oxyCODONE-acetaminophen (PERCOCET) 5-325 MG tablet Take 1 tablet by mouth every 8 (eight) hours as needed. 06/16/22  Yes Karthikeya Funke, Barbara Cower, MD  albuterol (VENTOLIN HFA) 108 (90 Base) MCG/ACT inhaler Inhale 2 puffs into the lungs every 6 (six) hours as needed for wheezing or shortness of breath. 01/05/22   Rehman, Areeg N, DO  amLODipine (NORVASC) 5 MG tablet Take 1 tablet (5 mg total) by mouth daily. 01/01/22 01/01/23  Rehman, Areeg N, DO  fluticasone (FLONASE SENSIMIST) 27.5 MCG/SPRAY nasal spray Place 2 sprays into the nose daily as needed for rhinitis. 01/05/22   Rehman, Areeg N, DO  hydrOXYzine (ATARAX) 25 MG tablet Take 1 tablet (25 mg total) by mouth every 8 (eight) hours as needed for anxiety. 01/01/22   Rehman, Areeg N, DO  ibuprofen (ADVIL,MOTRIN) 200 MG tablet Take 2 tablets (400 mg total) by mouth every 6 (six) hours as needed for mild pain. 06/28/16   Carolynn Comment, MD  ketotifen (ZADITOR) 0.025 % ophthalmic solution Place 1 drop into both  eyes 2 (two) times daily. 01/01/22   Rehman, Areeg N, DO  mirtazapine (REMERON) 45 MG tablet TAKE 1 TABLET(45 MG) BY MOUTH AT BEDTIME 01/01/22   Rehman, Areeg N, DO  montelukast (SINGULAIR) 10 MG tablet TAKE 1 TABLET(10 MG) BY MOUTH DAILY 04/10/22   Katsadouros, Vasilios, MD  Multiple Vitamins-Minerals (ONE-A-DAY 50 PLUS PO) Take by mouth.    [provider]  ondansetron (ZOFRAN) 4 MG tablet Take 1 tablet (4 mg total) by mouth as directed for 2 doses. Take 1 -4 mg Zofran 30-60 minutes before the prep dose the day before the colonoscopy Take 1-4 mg Zofran 30-60 minutes before the prep dose the day of the colonoscopy 12/21/21   Nandigam, Eleonore Chiquito, MD  QUEtiapine (SEROQUEL) 50 MG tablet TAKE 3 TABLETS(150 MG) BY MOUTH AT BEDTIME 01/01/22   Rehman, Areeg N, DO  Vitamin D, Cholecalciferol, 25 MCG (1000 UT) TABS Take 1 tablet by mouth daily. 01/05/22   Rehman, Areeg N, DO  Vitamin D, Ergocalciferol, (DRISDOL) 1.25 MG (50000 UNIT) CAPS capsule TAKE 1 CAPSULE BY MOUTH EVERY 7 DAYS 04/13/22   Belva Agee, MD      Allergies    Patient has no known allergies.    Review of Systems   Review of Systems  Physical Exam Updated Vital Signs BP (!) 155/93 (BP Location: Right Arm)   Pulse 86   Temp  98.3 F (36.8 C)   Resp 16   Ht 5\' 4"  (1.626 m)   Wt 56.2 kg   SpO2 97%   BMI 21.28 kg/m  Physical Exam Vitals and nursing note reviewed.  Constitutional:      Appearance: She is well-developed.  HENT:     Head: Normocephalic and atraumatic.     Comments: Right submandibular swelling inflammation around her second molar.  No trismus.  No deep neck space infection.      Mouth/Throat:     Mouth: Mucous membranes are moist.     Pharynx: Oropharynx is clear.  Eyes:     Pupils: Pupils are equal, round, and reactive to light.  Cardiovascular:     Rate and Rhythm: Normal rate and regular rhythm.  Pulmonary:     Effort: No respiratory distress.     Breath sounds: No stridor.  Abdominal:      General: There is no distension.  Musculoskeletal:        General: No swelling or tenderness. Normal range of motion.     Cervical back: Normal range of motion.  Skin:    General: Skin is warm and dry.  Neurological:     General: No focal deficit present.     Mental Status: She is alert.     ED Results / Procedures / Treatments   Labs (all labs ordered are listed, but only abnormal results are displayed) Labs Reviewed  BASIC METABOLIC PANEL - Abnormal; Notable for the following components:      Result Value   Potassium 3.4 (*)    Glucose, Bld 144 (*)    BUN <5 (*)    Anion gap 16 (*)    All other components within normal limits  CBC WITH DIFFERENTIAL/PLATELET - Abnormal; Notable for the following components:   WBC 12.2 (*)    Lymphs Abs 4.7 (*)    Monocytes Absolute 1.1 (*)    All other components within normal limits    EKG None  Radiology CT Soft Tissue Neck W Contrast  Result Date: 06/16/2022 CLINICAL DATA:  Soft tissue swelling EXAM: CT NECK WITH CONTRAST TECHNIQUE: Multidetector CT imaging of the neck was performed using the standard protocol following the bolus administration of intravenous contrast. RADIATION DOSE REDUCTION: This exam was performed according to the departmental dose-optimization program which includes automated exposure control, adjustment of the mA and/or kV according to patient size and/or use of iterative reconstruction technique. CONTRAST:  5mL OMNIPAQUE IOHEXOL 300 MG/ML  SOLN COMPARISON:  None Available. FINDINGS: Pharynx and larynx: Normal. No mass or swelling. Salivary glands: There is increased contrast enhancements of the right sublingual gland. There is hyperenhancement of the submandibular glands. Thyroid: Normal. Lymph nodes: There are clustered subcentimeter bilateral level 1B lymph nodes. Vascular: Calcific aortic atherosclerosis. Limited intracranial: Normal Visualized orbits: Negative. Mastoids and visualized paranasal sinuses: Clear.  Skeleton: No acute or aggressive process. Upper chest: Biapical emphysema. Other: In the right temporal soft tissues is a fluid collection that measures 2.6 x 1.2 cm with an area central metallic or calcific density. This appears iatrogenic. There is mild inflammatory stranding within the subcutaneous tissues of the lower right face. IMPRESSION: 1. Hyperenhancement of the submandibular glands and the right sublingual gland, concerning for acute sialadenitis. 2. Clustered right greater than left submandibular lymph nodes are likely reactive. 3. A 2.6 x 1.2 cm fluid collection with central metallic density within the right temporal soft tissues, likely iatrogenic. 4. Aortic Atherosclerosis (ICD10-I70.0) and Emphysema (ICD10-J43.9). Electronically Signed  By: Deatra Robinson M.D.   On: 06/16/2022 02:18    Procedures Procedures    Medications Ordered in ED Medications  ondansetron (ZOFRAN) injection 4 mg (4 mg Intravenous Not Given 06/16/22 0340)  oxyCODONE-acetaminophen (PERCOCET/ROXICET) 5-325 MG per tablet 2 tablet (has no administration in time range)  ketorolac (TORADOL) injection 15 mg (has no administration in time range)  clindamycin (CLEOCIN) capsule 300 mg (has no administration in time range)  oxyCODONE-acetaminophen (PERCOCET/ROXICET) 5-325 MG per tablet 2 tablet (2 tablets Oral Given 06/15/22 1808)  iohexol (OMNIPAQUE) 300 MG/ML solution 75 mL (75 mLs Intravenous Contrast Given 06/16/22 0152)    ED Course/ Medical Decision Making/ A&P                           Medical Decision Making Risk Prescription drug management.   CT done and showed sialadenitis and temporal fluid collection with internal calcification (independently viewed and interpreted by myself and radiology read reviewed).  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  I suspect that she probably has a dental infection that is causing  inflammation of her submandibular gland.  She knows to do sialagogues and we will start her on some antibiotics as well.  She has follow-up with her PCP for her blood pressure on Monday and she can follow-up with a dentist when able.   Final Clinical Impression(s) / ED Diagnoses Final diagnoses:  Pain, dental  Sialadenitis    Rx / DC Orders ED Discharge Orders          Ordered    oxyCODONE-acetaminophen (PERCOCET) 5-325 MG tablet  Every 8 hours PRN        06/16/22 0432    clindamycin (CLEOCIN) 300 MG capsule  4 times daily        06/16/22 0432              Amadu Schlageter, Barbara Cower, MD 06/16/22 [phone]

## 2022-06-18 ENCOUNTER — Encounter: Payer: Medicaid Other | Admitting: Student

## 2022-06-21 ENCOUNTER — Other Ambulatory Visit: Payer: Self-pay

## 2022-06-21 ENCOUNTER — Other Ambulatory Visit: Payer: Self-pay | Admitting: Student

## 2022-06-21 DIAGNOSIS — F314 Bipolar disorder, current episode depressed, severe, without psychotic features: Secondary | ICD-10-CM

## 2022-06-21 MED ORDER — HYDROXYZINE HCL 25 MG PO TABS: 25.0000 mg | ORAL_TABLET | Freq: Three times a day (TID) | ORAL | 0 refills | Status: DC | PRN

## 2022-06-21 MED ORDER — FLUTICASONE PROPIONATE 50 MCG/ACT NA SUSP: 1.0000 | Freq: Every day | NASAL | 2 refills | Status: DC

## 2022-06-21 MED ORDER — MIRTAZAPINE 45 MG PO TABS: ORAL_TABLET | ORAL | 0 refills | Status: DC

## 2022-07-15 ENCOUNTER — Other Ambulatory Visit: Payer: Self-pay | Admitting: Student

## 2022-07-15 DIAGNOSIS — J309 Allergic rhinitis, unspecified: Secondary | ICD-10-CM

## 2022-07-16 ENCOUNTER — Other Ambulatory Visit: Payer: Self-pay | Admitting: Student

## 2022-07-16 DIAGNOSIS — J309 Allergic rhinitis, unspecified: Secondary | ICD-10-CM

## 2022-07-20 ENCOUNTER — Other Ambulatory Visit: Payer: Self-pay | Admitting: Student

## 2022-07-20 DIAGNOSIS — E559 Vitamin D deficiency, unspecified: Secondary | ICD-10-CM

## 2022-10-02 ENCOUNTER — Ambulatory Visit
Admission: EM | Admit: 2022-10-02 | Discharge: 2022-10-02 | Disposition: A | Discharge status: Home / Self Care | Payer: Medicaid Other | Attending: Emergency Medicine | Admitting: Emergency Medicine

## 2022-10-02 DIAGNOSIS — N898 Other specified noninflammatory disorders of vagina: Secondary | ICD-10-CM | POA: Diagnosis not present

## 2022-10-02 DIAGNOSIS — I10 Essential (primary) hypertension: Secondary | ICD-10-CM | POA: Diagnosis not present

## 2022-10-02 DIAGNOSIS — Z113 Encounter for screening for infections with a predominantly sexual mode of transmission: Secondary | ICD-10-CM | POA: Diagnosis not present

## 2022-10-02 LAB — POCT URINALYSIS DIP (MANUAL ENTRY)
Bilirubin, UA: NEGATIVE
Blood, UA: NEGATIVE
Glucose, UA: NEGATIVE mg/dL
Ketones, POC UA: NEGATIVE mg/dL
Leukocytes, UA: NEGATIVE
Nitrite, UA: NEGATIVE
Protein Ur, POC: NEGATIVE mg/dL
Spec Grav, UA: 1.01 (ref 1.010–1.025)
Urobilinogen, UA: 0.2 E.U./dL
pH, UA: 5.5 (ref 5.0–8.0)

## 2022-10-02 MED ORDER — CEFTRIAXONE SODIUM 500 MG IJ SOLR
500.0000 mg | Freq: Once | INTRAMUSCULAR | Status: AC
  Administered 2022-10-02: 500 mg via INTRAMUSCULAR

## 2022-10-02 MED ORDER — DOXYCYCLINE HYCLATE 100 MG PO CAPS: 100.0000 mg | ORAL_CAPSULE | Freq: Two times a day (BID) | ORAL | 0 refills | Status: AC

## 2022-10-02 NOTE — Discharge Instructions (Addendum)
Your urinalysis is negative for urinary tract infection.  I have treated you empirically for gonorrhea today.  Doxycycline will cover chlamydia.  You can discontinue the doxycycline if chlamydia testing comes back negative.  We are checking for gonorrhea, chlamydia, trichomonas, syphilis, HIV, bacterial vaginosis and yeast vaginitis.  We will base further treatment off of your labs.  These will take several days to come back.  Take the medication as written. Give [place] a working phone number so that we can contact you if needed. Refrain from sexual contact until all of your labs have come back, symptoms have resolved, and your partner(s) are treated if necessary. Return to the ER if you get worse, have a fever >100.4, or for any concerns.    Keep an eye on your blood pressure.  Continue your medications.  It is important to keep your blood pressure under good control, as having a elevated blood pressure for prolonged periods of time significantly increases your risk of stroke, heart attacks, kidney damage, eye damage, and other problems. Measure your blood pressure once a day, preferably at the same time every day. Keep a log of this and bring it to your next doctor's appointment.  Bring your blood pressure cuff as well.  Return immediately to the ER if you start having chest pain, headache, problems seeing, problems talking, problems walking, if you feel like you're about to pass out, if you do pass out, if you have a seizure, or for any other concerns.  Go to www.goodrx.com  or www.costplusdrugs.com to look up your medications. This will give you a list of where you can find your prescriptions at the most affordable prices. Or ask the pharmacist what the cash price is, or if they have any other discount programs available to help make your medication more affordable. This can be less expensive than what you would pay with insurance.

## 2022-10-02 NOTE — Telephone Encounter (Signed)
Received refill request for [name]'s mirtazepine and seroquel. Last refill of psychiatric medications 10 months ago. Discussion during that visit was [facility] would fill seroquel and mirtazapine for one month and she would follow up with psychiatrist.  Can we please call her and schedule a follow up appointment prior to refilling these?

## 2022-10-02 NOTE — ED Provider Notes (Signed)
HPI  SUBJECTIVE:  [name] is a 51 y.o. female who presents with thick, odorous, yellow vaginal discharge and vaginal itching for the past several days, dysuria, odorous urine.  She states that she just found out that her female partner has been cheating on her.  Patient denies having any other partners.  No fevers, urinary urgency, frequency, cloudy urine or hematuria.  No abdominal, back, pelvic pain, genital rash, vaginal bleeding.  No antibiotics in the past month.  No antipyretic in the past 6 hours.  No aggravating or alleviating factors.  She has not tried anything for this.  She has a past medical history of hypertension and took her blood pressure medications today.  She states that it is elevated because she is stressed.  She has a remote history of gonorrhea chlamydia.  No history of HIV, HSV, syphilis, trichomonas, BV or yeast.  No history of diabetes.  PCP: [facility].   Past Medical History:  Diagnosis Date   Allergy 12/21/2021   Pollen   Anemia    Anxiety    Asthma    Bipolar disorder (HCC)    Cellulitis of right thumb 06/14/2016   Chronic bronchitis (HCC)    Hypertension    Mitral valve prolapse     Past Surgical History:  Procedure Laterality Date   NO PAST SURGERIES      Family History  Problem Relation Age of Onset   Breast cancer Paternal Aunt    Colon cancer Neg Hx    Colon polyps Neg Hx    Esophageal cancer Neg Hx    Rectal cancer Neg Hx    Stomach cancer Neg Hx     Social History   Tobacco Use   Smoking status: Every Day    Packs/day: 0.10    Years: 25.00    Total pack years: 2.50    Types: Cigarettes   Smokeless tobacco: Never  Vaping Use   Vaping Use: Never used  Substance Use Topics   Alcohol use: Yes    Alcohol/week: 2.0 standard drinks of alcohol    Types: 1 Glasses of wine, 1 Cans of beer per week    Comment: Socially., less than monthly   Drug use: Yes    Types: Marijuana    Comment: Socially.    No current  facility-administered medications for this encounter.  Current Outpatient Medications:    doxycycline (VIBRAMYCIN) 100 MG capsule, Take 1 capsule (100 mg total) by mouth 2 (two) times daily for 7 days., Disp: 14 capsule, Rfl: 0   albuterol (VENTOLIN HFA) 108 (90 Base) MCG/ACT inhaler, Inhale 2 puffs into the lungs every 6 (six) hours as needed for wheezing or shortness of breath., Disp: 8 g, Rfl: 12   amLODipine (NORVASC) 5 MG tablet, Take 1 tablet (5 mg total) by mouth daily., Disp: 90 tablet, Rfl: 2   fluticasone (FLONASE SENSIMIST) 27.5 MCG/SPRAY nasal spray, Place 2 sprays into the nose daily as needed for rhinitis., Disp: 10 g, Rfl: 12   fluticasone (FLONASE) 50 MCG/ACT nasal spray, Place 1 spray into both nostrils daily., Disp: 9.9 mL, Rfl: 2   hydrOXYzine (ATARAX) 25 MG tablet, Take 1 tablet (25 mg total) by mouth every 8 (eight) hours as needed for anxiety., Disp: 30 tablet, Rfl: 0   ketotifen (ZADITOR) 0.025 % ophthalmic solution, Place 1 drop into both eyes 2 (two) times daily., Disp: 5 mL, Rfl: 0   mirtazapine (REMERON) 45 MG tablet, TAKE 1 TABLET(45 MG) BY MOUTH AT  BEDTIME, Disp: 30 tablet, Rfl: 0   montelukast (SINGULAIR) 10 MG tablet, TAKE 1 TABLET(10 MG) BY MOUTH DAILY, Disp: 30 tablet, Rfl: 0   Multiple Vitamins-Minerals (ONE-A-DAY 50 PLUS PO), Take by mouth., Disp: , Rfl:    QUEtiapine (SEROQUEL) 50 MG tablet, TAKE 3 TABLETS(150 MG) BY MOUTH AT BEDTIME, Disp: 90 tablet, Rfl: 0   Vitamin D, Cholecalciferol, 25 MCG (1000 UT) TABS, Take 1 tablet by mouth daily., Disp: 90 tablet, Rfl: 2   Vitamin D, Ergocalciferol, (DRISDOL) 1.25 MG (50000 UNIT) CAPS capsule, TAKE 1 CAPSULE BY MOUTH EVERY 7 DAYS, Disp: 5 capsule, Rfl: 0  No Known Allergies   ROS  As noted in HPI.   Physical Exam  BP (!) 169/108   Pulse 86   Temp (!) 97.3 F (36.3 C)   Resp 18   SpO2 98%   BP Readings from Last 3 Encounters:  10/02/22 (!) 169/108  06/16/22 (!) 123/98  01/01/22 (!) 146/96     Constitutional: Well developed, well nourished, no acute distress Eyes:  EOMI, conjunctiva normal bilaterally HENT: Normocephalic, atraumatic,mucus membranes moist Respiratory: Normal inspiratory effort Cardiovascular: Normal rate GI: nondistended soft, nontender. No suprapubic, flank tenderness  back: No CVA tenderness GU: Deferred skin: No rash, skin intact Musculoskeletal: no deformities Neurologic: Alert & oriented x 3, no focal neuro deficits Psychiatric: Speech and behavior appropriate   ED Course   Medications  cefTRIAXone (ROCEPHIN) injection 500 mg (500 mg Intramuscular Given 10/02/22 1845)    Orders Placed This Encounter  Procedures   RPR    Standing Status:   Standing    Number of Occurrences:   1   HIV Antibody (routine testing w rflx)    Standing Status:   Standing    Number of Occurrences:   1   POCT urinalysis dipstick (new)    Standing Status:   Standing    Number of Occurrences:   1    Results for orders placed or performed during the hospital encounter of 10/02/22 (from the past 24 hour(s))  POCT urinalysis dipstick (new)     Status: None   Collection Time: 10/02/22  6:44 PM  Result Value Ref Range   Color, UA yellow yellow   Clarity, UA clear clear   Glucose, UA negative negative mg/dL   Bilirubin, UA negative negative   Ketones, POC UA negative negative mg/dL   Spec Grav, UA 1.010 1.010 - 1.025   Blood, UA negative negative   pH, UA 5.5 5.0 - 8.0   Protein Ur, POC negative negative mg/dL   Urobilinogen, UA 0.2 0.2 or 1.0 E.U./dL   Nitrite, UA Negative Negative   Leukocytes, UA Negative Negative   No results found.  ED Clinical Impression  1. Vaginal discharge   2. Screening for STDs (sexually transmitted diseases)   3. Elevated blood pressure reading with diagnosis of hypertension     ED Assessment/Plan     1.  Vaginal discharge, screening for STDs.  UA negative for UTI.  History concerning for gonorrhea or chlamydia.  Patient  is requesting empiric treatment now.   Sent off GC/chlamydia, wet prep, HIV, RPR. Giving ceftriaxone 500 mg IM, and will send home with doxycycline 100 mg p.o. twice daily for 1 week.  She is to discontinue this if chlamydia comes back negative.  Will prescribe further medications based on labs.  Advised pt to refrain from sexual contact until she  knows lab results, symptoms resolve, and partner(s) are treated if necessary. Pt provided  working phone number.   2.  Elevated blood pressure with diagnosis of hypertension.  Patient appears very upset.  She is compliant with her medications.  She is otherwise asymptomatic.  She is to to keep an eye on this at home.  ER hypertensive emergency precautions given.  Follow-up with PCP as needed. Discussed labs, MDM, plan and followup with patient. Pt agrees with plan.   Meds ordered this encounter  Medications   cefTRIAXone (ROCEPHIN) injection 500 mg   doxycycline (VIBRAMYCIN) 100 MG capsule    Sig: Take 1 capsule (100 mg total) by mouth 2 (two) times daily for 7 days.    Dispense:  14 capsule    Refill:  0    *This clinic note was created using Scientist, clinical (histocompatibility and immunogenetics). Therefore, there may be occasional mistakes despite careful proofreading.  ?     Domenick Gong, MD 10/03/22 [phone]

## 2022-10-02 NOTE — ED Triage Notes (Addendum)
[facility] with [facility] for [facility]. Pt reports vaginal discharge. Pt reports she reports her boyfriend has been cheating on her and she wants to make sure she has nothing. Pt is wanting blood test and tx if possible.

## 2022-10-03 ENCOUNTER — Telehealth (HOSPITAL_COMMUNITY): Payer: Self-pay | Admitting: Emergency Medicine

## 2022-10-03 LAB — CERVICOVAGINAL ANCILLARY ONLY
Bacterial Vaginitis (gardnerella): NEGATIVE
Candida Glabrata: NEGATIVE
Candida Vaginitis: POSITIVE — AB
Chlamydia: NEGATIVE
Comment: NEGATIVE
Comment: NEGATIVE
Comment: NEGATIVE
Comment: NEGATIVE
Comment: NEGATIVE
Comment: NORMAL
Neisseria Gonorrhea: NEGATIVE
Trichomonas: NEGATIVE

## 2022-10-03 MED ORDER — FLUCONAZOLE 150 MG PO TABS: 150.0000 mg | ORAL_TABLET | Freq: Once | ORAL | 0 refills | Status: AC

## 2022-10-03 NOTE — Telephone Encounter (Signed)
Patient has scheduled an appointment on 10/12/22 at 8:45 am with Dr. Christiana Fuchs.

## 2022-10-04 LAB — RPR: RPR Ser Ql: REACTIVE — AB

## 2022-10-04 LAB — HIV ANTIBODY (ROUTINE TESTING W REFLEX): HIV Screen 4th Generation wRfx: NONREACTIVE

## 2022-10-04 LAB — RPR, QUANT+TP ABS (REFLEX)
Rapid Plasma Reagin, Quant: 1:2 {titer} — ABNORMAL HIGH
T Pallidum Abs: REACTIVE — AB

## 2022-10-05 ENCOUNTER — Telehealth (HOSPITAL_COMMUNITY): Payer: Self-pay | Admitting: Emergency Medicine

## 2022-10-05 NOTE — Telephone Encounter (Signed)
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  Verified identity using two identifiers.  Provided positive result.  Reviewed safe sex practices, notifying partners, and refraining from sexual activities for 7 days from time of treatment.  Patient verified understanding, all questions answered.   HHS notified

## 2022-10-12 ENCOUNTER — Encounter: Payer: Medicaid Other | Admitting: Student

## 2022-12-11 ENCOUNTER — Other Ambulatory Visit: Payer: Self-pay | Admitting: Family Medicine

## 2022-12-11 DIAGNOSIS — Z1231 Encounter for screening mammogram for malignant neoplasm of breast: Secondary | ICD-10-CM

## 2023-01-03 ENCOUNTER — Other Ambulatory Visit: Payer: Self-pay

## 2023-01-03 ENCOUNTER — Ambulatory Visit (INDEPENDENT_AMBULATORY_CARE_PROVIDER_SITE_OTHER): Payer: Medicaid Other | Admitting: Student

## 2023-01-03 ENCOUNTER — Encounter: Payer: Self-pay | Admitting: Student

## 2023-01-03 VITALS — BP 136/65 | HR 77 | Temp 98.2°F | Ht 64.0 in | Wt 121.1 lb

## 2023-01-03 DIAGNOSIS — Z8619 Personal history of other infectious and parasitic diseases: Secondary | ICD-10-CM

## 2023-01-03 DIAGNOSIS — Z1211 Encounter for screening for malignant neoplasm of colon: Secondary | ICD-10-CM

## 2023-01-03 DIAGNOSIS — R7309 Other abnormal glucose: Secondary | ICD-10-CM | POA: Diagnosis not present

## 2023-01-03 DIAGNOSIS — B3731 Acute candidiasis of vulva and vagina: Secondary | ICD-10-CM | POA: Diagnosis not present

## 2023-01-03 DIAGNOSIS — E559 Vitamin D deficiency, unspecified: Secondary | ICD-10-CM | POA: Diagnosis not present

## 2023-01-03 DIAGNOSIS — Z0000 Encounter for general adult medical examination without abnormal findings: Secondary | ICD-10-CM

## 2023-01-03 MED ORDER — FLUCONAZOLE 150 MG PO TABS: 150.0000 mg | ORAL_TABLET | Freq: Every day | ORAL | 0 refills | Status: AC

## 2023-01-03 NOTE — Patient Instructions (Addendum)
Thank you, Ms.Augustina Mood for allowing [place] to provide your care today. Today we discussed your yeast infection, syphilis, vitamin D deficiency and cancer screenings.    I have ordered the following labs for you:   Lab Orders         Hemoglobin A1c         Vitamin D (25 hydroxy)      I will call if any are abnormal. All of your labs can be accessed through "My Chart".  I have place a referrals to gastroenterology for colonoscopy  I have ordered the following medication/changed the following medications:  Take Diflucan 150 mg for 1 day  My Chart Access: [email or web address]  Please follow-up in 6 months  Please make sure to arrive 15 minutes prior to your next appointment. If you arrive late, you may be asked to reschedule.    We look forward to seeing you next time. Please call our clinic at [phone] if you have any questions or concerns. The best time to call is Monday-Friday from 9am-4pm, but there is someone available 24/7. If after hours or the weekend, call the main hospital number and ask for the Internal Medicine Resident On-Call. If you need medication refills, please notify your pharmacy one week in advance and they will send [place] a request.   Thank you for letting us take part in your care. Wishing you the best!  Lacinda Axon, MD 01/03/2023, 9:51 AM IM Resident, PGY-3 [facility] 41:10

## 2023-01-03 NOTE — Progress Notes (Signed)
   CC: Vaginal discharge  HPI:  [name] is a 52 y.o. female with PMH below who presents to clinic for treatment of yeast infection. Please see problem based charting for evaluation, assessment and plan.  Past Medical History:  Diagnosis Date   Allergy 12/21/2021   Pollen   Anemia    Anxiety    Asthma    Bipolar disorder ([place])    Cellulitis of right thumb 06/14/2016   Chronic bronchitis (HCC)    Hypertension    Mitral valve prolapse     Review of Systems:  Constitutional: Negative for fever or fatigue MSK: Negative for back pain Abdomen: Negative for abdominal pain, constipation or diarrhea GU: Positive for vaginal discharge. Negative for vaginal itching, pain or bleeding. Neuro: Negative for headache or weakness  Physical Exam: General: Pleasant, well-appearing middle-aged woman.  No acute distress. Cardiac: RRR. No murmurs, rubs or gallops. No LE edema Respiratory: Lungs CTAB. No wheezing or crackles. Abdominal: Soft, symmetric and non tender. Normal BS. Skin: Warm, dry and intact without rashes or lesions Neuro: A&O x 3. Moves all extremities.  Normal sensation to gross touch. Psych: Appropriate mood and affect.  Vitals:   01/03/23 0916  BP: 136/65  Pulse: 77  Temp: 98.2 F (36.8 C)  TempSrc: Oral  SpO2: 100%  Weight: 121 lb 1.6 oz (54.9 kg)  Height: 5\' 4"  (1.626 m)    Assessment & Plan:   Vaginal candidiasis Patient presents today for treatment of vaginal discharge. Patient reports that her husband cheated on her last year around October so she went to the urgent care to be screened for possible STDs. She was given IM Rocephin 500 mg and discharged home on doxycycline pending lab work. Lab work showed that patient was negative for HIV, gonorrhea, chlamydia or trichomonas but positive for syphilis and Candida vaginitis. Patient was informed of the results and told to pick up medication however she states she recently moved and did not get a chance to  pick up her medication for the yeast infection. She has continued to have vaginal discharge since then. She describes it at thin, yellowish discharge without odor. She denies any vaginal itching, vaginal pain, rash or bleeding. She is in in a monogamous relationship with another female, they do not use protection consistently but she has no concern for any STIs. Patient interested in getting treated for her yeast infection. Educated patient that we often do not treat vaginal discharges if they are mild and she has no other vaginal symptoms.  A1c today is normal at 5.4%.  Plan: -Take Diflucan 150 mg x 1 -Follow-up as needed  Vitamin D deficiency Patient vitamin D levels was treated with high-dose vitamin D a year ago due to low vitamin D of 8.6 but no repeat vitamin D levels on file. Not currently on vitamin D supplementation. Repeat vitamin D levels improved to 22.1. -Start vitamin D 1000 units daily  History of latent syphilis Patient reports history of syphilis back in 1991 that was treated. During the recent testing for STI, RPR was reactive and RPR titer showed a ratio of 1:2. Patient was referred to the health department and received treatment. She was advised to follow-up next month for repeat testing. -Follow-up with health department  Healthcare maintenance Referred to GI for colonoscopy    See Encounters Tab for problem based charting.  Patient discussed with Dr. Gardiner Sleeper, MD, MPH

## 2023-01-04 ENCOUNTER — Encounter: Payer: Self-pay | Admitting: Student

## 2023-01-04 DIAGNOSIS — Z8619 Personal history of other infectious and parasitic diseases: Secondary | ICD-10-CM | POA: Insufficient documentation

## 2023-01-04 LAB — HEMOGLOBIN A1C
Est. average glucose Bld gHb Est-mCnc: 108 mg/dL
Hgb A1c MFr Bld: 5.4 % (ref 4.8–5.6)

## 2023-01-04 LAB — VITAMIN D 25 HYDROXY (VIT D DEFICIENCY, FRACTURES): Vit D, 25-Hydroxy: 22.1 ng/mL — ABNORMAL LOW (ref 30.0–100.0)

## 2023-01-04 MED ORDER — VITAMIN D (CHOLECALCIFEROL) 25 MCG (1000 UT) PO TABS: 1.0000 | ORAL_TABLET | Freq: Every day | ORAL | 2 refills | Status: DC

## 2023-01-04 NOTE — Assessment & Plan Note (Signed)
Referred to GI for colonoscopy

## 2023-01-04 NOTE — Assessment & Plan Note (Signed)
Patient presents today for treatment of vaginal discharge. Patient reports that her husband cheated on her last year around October so she went to the urgent care to be screened for possible STDs. She was given IM Rocephin 500 mg and discharged home on doxycycline pending lab work. Lab work showed that patient was negative for HIV, gonorrhea, chlamydia or trichomonas but positive for syphilis and Candida vaginitis. Patient was informed of the results and told to pick up medication however she states she recently moved and did not get a chance to pick up her medication for the yeast infection. She has continued to have vaginal discharge since then. She describes it at thin, yellowish discharge without odor. She denies any vaginal itching, vaginal pain, rash or bleeding. She is in in a monogamous relationship with another female, they do not use protection consistently but she has no concern for any STIs. Patient interested in getting treated for her yeast infection. Educated patient that we often do not treat vaginal discharges if they are mild and she has no other vaginal symptoms.  A1c today is normal at 5.4%.  Plan: -Take Diflucan 150 mg x 1 -Follow-up as needed

## 2023-01-04 NOTE — Assessment & Plan Note (Signed)
Patient reports history of syphilis back in 1991 that was treated. During the recent testing for STI, RPR was reactive and RPR titer showed a ratio of 1:2. Patient was referred to the health department and received treatment. She was advised to follow-up next month for repeat testing. -Follow-up with health department

## 2023-01-04 NOTE — Assessment & Plan Note (Signed)
Patient vitamin D levels was treated with high-dose vitamin D a year ago due to low vitamin D of 8.6 but no repeat vitamin D levels on file. Not currently on vitamin D supplementation. Repeat vitamin D levels improved to 22.1. -Start vitamin D 1000 units daily

## 2023-01-14 NOTE — Progress Notes (Addendum)
Internal Medicine Clinic Attending  Case discussed with Dr. Amponsah  At the time of the visit.  We reviewed the resident's history and exam and pertinent patient test results.  I agree with the assessment, diagnosis, and plan of care documented in the resident's note.  

## 2023-01-29 ENCOUNTER — Ambulatory Visit
Admission: RE | Admit: 2023-01-29 | Discharge: 2023-01-29 | Disposition: A | Discharge status: Home / Self Care | Payer: Medicaid Other | Source: Ambulatory Visit | Attending: Family Medicine | Admitting: Family Medicine

## 2023-01-29 DIAGNOSIS — Z1231 Encounter for screening mammogram for malignant neoplasm of breast: Secondary | ICD-10-CM

## 2023-03-11 ENCOUNTER — Encounter: Payer: Self-pay | Admitting: Internal Medicine

## 2023-05-13 ENCOUNTER — Encounter: Payer: Self-pay | Admitting: Student

## 2023-05-16 ENCOUNTER — Other Ambulatory Visit: Payer: Self-pay | Admitting: Student

## 2023-05-16 DIAGNOSIS — F314 Bipolar disorder, current episode depressed, severe, without psychotic features: Secondary | ICD-10-CM

## 2023-05-16 DIAGNOSIS — J309 Allergic rhinitis, unspecified: Secondary | ICD-10-CM

## 2023-05-17 NOTE — Telephone Encounter (Signed)
Will refill for one month, can we please have her schedule a follow up appointment soon?   Thanks!

## 2023-06-17 ENCOUNTER — Ambulatory Visit (INDEPENDENT_AMBULATORY_CARE_PROVIDER_SITE_OTHER): Payer: Medicaid Other | Admitting: Student

## 2023-06-17 ENCOUNTER — Encounter: Payer: Self-pay | Admitting: Student

## 2023-06-17 ENCOUNTER — Other Ambulatory Visit (HOSPITAL_COMMUNITY): Payer: Self-pay

## 2023-06-17 VITALS — BP 155/100 | HR 70 | Temp 98.0°F | Wt 103.8 lb

## 2023-06-17 DIAGNOSIS — F17210 Nicotine dependence, cigarettes, uncomplicated: Secondary | ICD-10-CM | POA: Diagnosis not present

## 2023-06-17 DIAGNOSIS — I10 Essential (primary) hypertension: Secondary | ICD-10-CM | POA: Diagnosis not present

## 2023-06-17 DIAGNOSIS — F314 Bipolar disorder, current episode depressed, severe, without psychotic features: Secondary | ICD-10-CM | POA: Diagnosis not present

## 2023-06-17 DIAGNOSIS — J4520 Mild intermittent asthma, uncomplicated: Secondary | ICD-10-CM

## 2023-06-17 DIAGNOSIS — J309 Allergic rhinitis, unspecified: Secondary | ICD-10-CM | POA: Diagnosis not present

## 2023-06-17 DIAGNOSIS — F319 Bipolar disorder, unspecified: Secondary | ICD-10-CM | POA: Diagnosis not present

## 2023-06-17 DIAGNOSIS — F4321 Adjustment disorder with depressed mood: Secondary | ICD-10-CM | POA: Diagnosis not present

## 2023-06-17 DIAGNOSIS — Z0000 Encounter for general adult medical examination without abnormal findings: Secondary | ICD-10-CM

## 2023-06-17 DIAGNOSIS — E559 Vitamin D deficiency, unspecified: Secondary | ICD-10-CM

## 2023-06-17 MED ORDER — HYDROXYZINE HCL 25 MG PO TABS
25.0000 mg | ORAL_TABLET | Freq: Three times a day (TID) | ORAL | 0 refills | Status: AC | PRN
  Filled 2023-06-17: qty 30, 10d supply, fill #0

## 2023-06-17 MED ORDER — CETIRIZINE HCL 10 MG PO TABS
10.0000 mg | ORAL_TABLET | Freq: Every day | ORAL | 3 refills | Status: DC
  Filled 2023-06-17: qty 90, 90d supply, fill #0

## 2023-06-17 MED ORDER — QUETIAPINE FUMARATE 25 MG PO TABS
25.0000 mg | ORAL_TABLET | Freq: Every day | ORAL | 0 refills | Status: AC
  Filled 2023-06-17: qty 90, 90d supply, fill #0

## 2023-06-17 MED ORDER — MONTELUKAST SODIUM 10 MG PO TABS
10.0000 mg | ORAL_TABLET | Freq: Every day | ORAL | 3 refills | Status: DC
  Filled 2023-06-17: qty 90, 90d supply, fill #0

## 2023-06-17 MED ORDER — FLUTICASONE PROPIONATE 50 MCG/ACT NA SUSP
1.0000 | Freq: Every day | NASAL | 2 refills | Status: AC
  Filled 2023-06-17: qty 16, 30d supply, fill #0

## 2023-06-17 MED ORDER — VITAMIN D (CHOLECALCIFEROL) 25 MCG (1000 UT) PO TABS
1.0000 | ORAL_TABLET | Freq: Every day | ORAL | 2 refills | Status: AC
  Filled 2023-06-17 – 2023-06-24 (×2): qty 90, 90d supply, fill #0

## 2023-06-17 MED ORDER — MIRTAZAPINE 15 MG PO TABS
15.0000 mg | ORAL_TABLET | Freq: Every day | ORAL | 3 refills | Status: DC
  Filled 2023-06-17: qty 90, 90d supply, fill #0

## 2023-06-17 MED ORDER — HYDROXYZINE HCL 25 MG PO TABS
ORAL_TABLET | ORAL | 0 refills | Status: DC
  Filled 2023-06-17: qty 30, fill #0

## 2023-06-17 MED ORDER — AMLODIPINE BESYLATE 5 MG PO TABS
5.0000 mg | ORAL_TABLET | Freq: Every day | ORAL | 3 refills | Status: DC
  Filled 2023-06-17: qty 90, 90d supply, fill #0

## 2023-06-17 MED ORDER — MONTELUKAST SODIUM 10 MG PO TABS
10.0000 mg | ORAL_TABLET | Freq: Every day | ORAL | 3 refills | Status: AC
  Filled 2023-06-17: qty 90, 90d supply, fill #0

## 2023-06-17 MED ORDER — QUETIAPINE FUMARATE 25 MG PO TABS
25.0000 mg | ORAL_TABLET | Freq: Every day | ORAL | 0 refills | Status: DC
  Filled 2023-06-17: qty 90, 90d supply, fill #0

## 2023-06-17 MED ORDER — VITAMIN D (CHOLECALCIFEROL) 25 MCG (1000 UT) PO TABS
1.0000 | ORAL_TABLET | Freq: Every day | ORAL | 2 refills | Status: DC
  Filled 2023-06-17: qty 90, fill #0

## 2023-06-17 MED ORDER — ALBUTEROL SULFATE HFA 108 (90 BASE) MCG/ACT IN AERS
2.0000 | INHALATION_SPRAY | Freq: Four times a day (QID) | RESPIRATORY_TRACT | 12 refills | Status: AC | PRN
  Filled 2023-06-17: qty 18, 25d supply, fill #0

## 2023-06-17 MED ORDER — FLUTICASONE PROPIONATE 50 MCG/ACT NA SUSP
1.0000 | Freq: Every day | NASAL | 2 refills | Status: DC
  Filled 2023-06-17: qty 9.9, fill #0

## 2023-06-17 MED ORDER — ALBUTEROL SULFATE HFA 108 (90 BASE) MCG/ACT IN AERS
2.0000 | INHALATION_SPRAY | Freq: Four times a day (QID) | RESPIRATORY_TRACT | 12 refills | Status: DC | PRN
  Filled 2023-06-17: qty 13.4, fill #0

## 2023-06-17 NOTE — Addendum Note (Signed)
Addended by: Lucille Passy on: 06/17/2023 05:20 PM   Modules accepted: Orders

## 2023-06-17 NOTE — Assessment & Plan Note (Addendum)
Patient was unable to follow-up with GI to get colonoscopy scheduled. Hesitant to get scoped. We discussed the importance of colorectal cancer screening.  Patient amenable to scheduling colonoscopy. Plan: -GI referral for colonoscopy.

## 2023-06-17 NOTE — Assessment & Plan Note (Addendum)
History of bipolar disorder, not currently taking any medications.  Says she recently moved in with her father and her medication was lost during that transition.  Recently lost her sister and has had loss of appetite, sadness, insomnia since then.  She denies any manic episodes.  Patient does follow-up with psychiatry, however she does not met  them since beginning of the pandemic Plan: -Restart risperidone 25 -Restart Remeron 15 -Atarax PRN for anxiety -Referral for psychiatric management.

## 2023-06-17 NOTE — Assessment & Plan Note (Signed)
Lost her sister in May.  She was diagnosed with stage IV breast cancer, and passed away sadly 2 weeks later.  Patient has had trouble processing.  She has lost nearly 20 pounds since we last saw her.  She says he has no appetite recently, following the loss of her sister.  She denies any weight loss or loss of appetite prior to her loss. Plan: -Restart Seroquel, Remeron -Referral to integrated behavioral health -CMP today to check for electrolyte abnormalities in the setting of profound weight loss.

## 2023-06-17 NOTE — Patient Instructions (Signed)
Thank you, [name] for allowing [place] to provide your care today. Today we discussed medication refills and grief.    I have ordered the following labs for you:   Lab Orders         CMP14 + Anion Gap      Referrals ordered today:    Referral Orders         Ambulatory referral to Gastroenterology         Ambulatory referral to Integrated Behavioral Health         Ambulatory referral to Psychiatry      I have ordered the following medication/changed the following medications:   Stop the following medications: Medications Discontinued During This Encounter  Medication Reason   mirtazapine (REMERON) 45 MG tablet    QUEtiapine (SEROQUEL) 50 MG tablet    albuterol (VENTOLIN HFA) 108 (90 Base) MCG/ACT inhaler Reorder   amLODipine (NORVASC) 5 MG tablet Reorder   fluticasone (FLONASE) 50 MCG/ACT nasal spray Reorder   Vitamin D, Cholecalciferol, 25 MCG (1000 UT) TABS Reorder   hydrOXYzine (ATARAX) 25 MG tablet Reorder   montelukast (SINGULAIR) 10 MG tablet Reorder     Start the following medications: Meds ordered this encounter  Medications   albuterol (VENTOLIN HFA) 108 (90 Base) MCG/ACT inhaler    Sig: Inhale 2 puffs into the lungs every 6 (six) hours as needed for wheezing or shortness of breath.    Dispense:  8 g    Refill:  12   amLODipine (NORVASC) 5 MG tablet    Sig: Take 1 tablet (5 mg total) by mouth daily.    Dispense:  90 tablet    Refill:  3   fluticasone (FLONASE) 50 MCG/ACT nasal spray    Sig: Place 1 spray into both nostrils daily.    Dispense:  9.9 mL    Refill:  2   hydrOXYzine (ATARAX) 25 MG tablet    Sig: TAKE 1 TABLET(25 MG) BY MOUTH EVERY 8 HOURS AS NEEDED FOR ANXIETY    Dispense:  30 tablet    Refill:  0   montelukast (SINGULAIR) 10 MG tablet    Sig: Take 1 tablet (10 mg total) by mouth at bedtime.    Dispense:  90 tablet    Refill:  3   Vitamin D, Cholecalciferol, 25 MCG (1000 UT) TABS    Sig: Take 1 tablet by mouth daily.    Dispense:  90  tablet    Refill:  2   mirtazapine (REMERON) 15 MG tablet    Sig: Take 1 tablet (15 mg total) by mouth at bedtime.    Dispense:  90 tablet    Refill:  3   cetirizine (ZYRTEC ALLERGY) 10 MG tablet    Sig: Take 1 tablet (10 mg total) by mouth daily.    Dispense:  90 tablet    Refill:  3   QUEtiapine (SEROQUEL) 25 MG tablet    Sig: Take 1 tablet (25 mg total) by mouth at bedtime.    Dispense:  90 tablet    Refill:  0     Follow up:  1 month for medication follow up    We look forward to seeing you next time. Please call our clinic at [phone] if you have any questions or concerns. The best time to call is Monday-Friday from 9am-4pm, but there is someone available 24/7. If after hours or the weekend, call the main hospital number and ask for the Internal Medicine Resident On-Call. If  you need medication refills, please notify your pharmacy one week in advance and they will send [place] a request.   Thank you for trusting me with your care. Wishing you the best!  Lovie Macadamia MD [facility]

## 2023-06-17 NOTE — Assessment & Plan Note (Addendum)
Hypertension, BP 164/103 today. Has not been taking medicine (lost during move) Plan: -Refill Amlodipine -Follow-up blood pressures 1 month

## 2023-06-17 NOTE — Assessment & Plan Note (Addendum)
Patient has been out of her allergy medicine for the past month. Says allergies have been worse recently.  Plan: -Begin Zyrtec. -Discussed holding Zyrtec when taking Atarax.

## 2023-06-17 NOTE — Assessment & Plan Note (Addendum)
Patient says she is having some asthma symptoms, predominantly when she is crying.  Denies nocturnal awakenings, endorses some shortness of breath during fits of grief.  Lungs clear to auscultation today. Plan: -Restart montelukast, albuterol. -Flonase for allergies -Zyrtec for allergies as needed, discussed overlapping effects of hydroxyzine with patient

## 2023-06-17 NOTE — Progress Notes (Signed)
Subjective:  CC: Medication Refill, Grief  HPI:  [name] is a 52 y.o. female with a past medical history stated below and presents today for medication management, grief, weight loss. Please see problem based assessment and plan for additional details.  Past Medical History:  Diagnosis Date   Allergy 12/21/2021   Pollen   Anemia    Anxiety    Asthma    Bipolar disorder (HCC)    Cellulitis of right thumb 06/14/2016   Chronic bronchitis (HCC)    Hypertension    Mitral valve prolapse     Current Outpatient Medications on File Prior to Visit  Medication Sig Dispense Refill   fluconazole (DIFLUCAN) 150 MG tablet Take 1 tablet (150 mg total) by mouth daily. 1 tablet 0   fluticasone (FLONASE SENSIMIST) 27.5 MCG/SPRAY nasal spray Place 2 sprays into the nose daily as needed for rhinitis. 10 g 12   ketotifen (ZADITOR) 0.025 % ophthalmic solution Place 1 drop into both eyes 2 (two) times daily. 5 mL 0   Multiple Vitamins-Minerals (ONE-A-DAY 50 PLUS PO) Take by mouth.     No current facility-administered medications on file prior to visit.    Family History  Problem Relation Age of Onset   Breast cancer Paternal Aunt    Colon cancer Neg Hx    Colon polyps Neg Hx    Esophageal cancer Neg Hx    Rectal cancer Neg Hx    Stomach cancer Neg Hx     Social History   Socioeconomic History   Marital status: Single    Spouse name: Not on file   Number of children: Not on file   Years of education: Not on file   Highest education level: Not on file  Occupational History   Not on file  Tobacco Use   Smoking status: Every Day    Packs/day: 0.30    Years: 25.00    Additional pack years: 0.00    Total pack years: 7.50    Types: Cigarettes   Smokeless tobacco: Never  Vaping Use   Vaping Use: Never used  Substance and Sexual Activity   Alcohol use: Yes    Alcohol/week: 2.0 standard drinks of alcohol    Types: 1 Glasses of wine, 1 Cans of beer per week    Comment:  Socially., less than monthly   Drug use: Yes    Types: Marijuana    Comment: Socially.   Sexual activity: Not Currently    Birth control/protection: Post-menopausal  Other Topics Concern   Not on file  Social History Narrative   Not on file   Social Determinants of Health   Financial Resource Strain: Not on file  Food Insecurity: No Food Insecurity (01/03/2023)   Hunger Vital Sign    Worried About Running Out of Food in the Last Year: Never true    Ran Out of Food in the Last Year: Never true  Transportation Needs: Unmet Transportation Needs (06/17/2023)   PRAPARE - Administrator, Civil Service (Medical): Yes    Lack of Transportation (Non-Medical): Yes  Physical Activity: Not on file  Stress: Not on file  Social Connections: Moderately Isolated (01/03/2023)   Social Connection and Isolation Panel [NHANES]    Frequency of Communication with Friends and Family: More than three times a week    Frequency of Social Gatherings with Friends and Family: More than three times a week    Attends Religious Services: Never    Active  Member of Clubs or Organizations: Yes    Attends Banker Meetings: Never    Marital Status: Never married  Intimate Partner Violence: Not At Risk (01/03/2023)   Humiliation, Afraid, Rape, and Kick questionnaire    Fear of Current or Ex-Partner: No    Emotionally Abused: No    Physically Abused: No    Sexually Abused: No    Review of Systems: ROS negative except for what is noted on the assessment and plan.  Objective:   Vitals:   06/17/23 0946 06/17/23 1035  BP: (!) 164/103 (!) 155/100  Pulse: 92 70  Temp: 98 F (36.7 C)   TempSrc: Oral   SpO2: 100%   Weight: 103 lb 12.8 oz (47.1 kg)     Physical Exam: Constitutional: well-appearing, tearful  HENT: normocephalic atraumatic, mucous membranes moist Eyes: conjunctiva non-erythematous Neck: supple Cardiovascular: regular rate and rhythm, no m/r/g Pulmonary/Chest: normal  work of breathing on room air, lungs clear to auscultation bilaterally Abdominal: soft, non-tender, non-distended Psych: Tearful affect   Assessment & Plan:  Chronic allergic rhinitis Patient has been out of her allergy medicine for the past month. Says allergies have been worse recently.  Plan: -Begin Zyrtec. -Discussed holding Zyrtec when taking Atarax.  Asthma in adult Patient says she is having some asthma symptoms, predominantly when she is crying.  Denies nocturnal awakenings, endorses some shortness of breath during fits of grief.  Lungs clear to auscultation today. Plan: -Restart montelukast, albuterol. -Flonase for allergies -Zyrtec for allergies as needed, discussed overlapping effects of hydroxyzine with patient  Healthcare maintenance Patient was unable to follow-up with GI to get colonoscopy scheduled. Hesitant to get scoped. We discussed the importance of colorectal cancer screening.  Patient amenable to scheduling colonoscopy. Plan: -GI referral for colonoscopy.  Grief Lost her sister in May.  She was diagnosed with stage IV breast cancer, and passed away sadly 2 weeks later.  Patient has had trouble processing.  She has lost nearly 20 pounds since we last saw her.  She says he has no appetite recently, following the loss of her sister.  She denies any weight loss or loss of appetite prior to her loss. Plan: -Restart Seroquel, Remeron -Referral to integrated behavioral health -CMP today to check for electrolyte abnormalities in the setting of profound weight loss.  Bipolar disorder (HCC) History of bipolar disorder, not currently taking any medications.  Says she recently moved in with her father and her medication was lost during that transition.  Recently lost her sister and has had loss of appetite, sadness, insomnia since then.  She denies any manic episodes.  Patient does follow-up with psychiatry, however she does not met  them since beginning of the  pandemic Plan: -Restart risperidone 25 -Restart Remeron 15 -Atarax PRN for anxiety -Referral for psychiatric management.   Hypertension Hypertension, BP 164/103 today. Has not been taking medicine (lost during move) Plan: -Refill Amlodipine -Follow-up blood pressures 1 month    Patient seen with Dr. Julian Reil MD [facility]  PGY-1 Pager: [phone]  Phone: [phone] Date 06/17/2023  Time 12:50 PM

## 2023-06-18 ENCOUNTER — Other Ambulatory Visit (HOSPITAL_COMMUNITY): Payer: Self-pay

## 2023-06-18 ENCOUNTER — Other Ambulatory Visit: Payer: Self-pay

## 2023-06-18 ENCOUNTER — Telehealth: Payer: Self-pay | Admitting: Student

## 2023-06-18 DIAGNOSIS — F4321 Adjustment disorder with depressed mood: Secondary | ICD-10-CM

## 2023-06-18 DIAGNOSIS — R636 Underweight: Secondary | ICD-10-CM

## 2023-06-18 LAB — CMP14 + ANION GAP
ALT: 145 IU/L — ABNORMAL HIGH (ref 0–32)
AST: 216 IU/L — ABNORMAL HIGH (ref 0–40)
Albumin: 5.2 g/dL — ABNORMAL HIGH (ref 3.8–4.9)
Alkaline Phosphatase: 212 IU/L — ABNORMAL HIGH (ref 44–121)
Anion Gap: 23 mmol/L — ABNORMAL HIGH (ref 10.0–18.0)
BUN/Creatinine Ratio: 14 (ref 9–23)
BUN: 9 mg/dL (ref 6–24)
Bilirubin Total: 0.5 mg/dL (ref 0.0–1.2)
CO2: 21 mmol/L (ref 20–29)
Calcium: 10.2 mg/dL (ref 8.7–10.2)
Chloride: 94 mmol/L — ABNORMAL LOW (ref 96–106)
Creatinine, Ser: 0.65 mg/dL (ref 0.57–1.00)
Globulin, Total: 3.2 g/dL (ref 1.5–4.5)
Glucose: 57 mg/dL — ABNORMAL LOW (ref 70–99)
Potassium: 4.9 mmol/L (ref 3.5–5.2)
Sodium: 138 mmol/L (ref 134–144)
Total Protein: 8.4 g/dL (ref 6.0–8.5)
eGFR: 107 mL/min/{1.73_m2} (ref >59)

## 2023-06-18 NOTE — Telephone Encounter (Signed)
I spoke with [name] on the phone. Patient's identity was confirmed using two patient specific identifiers. We discussed the patient's transaminitis which is consistent with alcohol use.  The patient disclosed that she has been drinking following the loss of her sister, she says that she was drinking about 2-3 drinks per night.  We discussed social determinants of health such as access to food, and the patient says she has had difficulty financially and with access to food.  I am placing a referral to social work to hopefully help assist her with some of her needs.  She will see [place] back in 1 month and we will continue to monitor her weight, mood, liver function.

## 2023-06-19 ENCOUNTER — Other Ambulatory Visit: Payer: Self-pay

## 2023-06-20 ENCOUNTER — Other Ambulatory Visit: Payer: Self-pay

## 2023-06-20 NOTE — Progress Notes (Signed)
Internal Medicine Clinic Attending  I was physically present during the key portions of the resident provided service and participated in the medical decision making of patient's management care. I reviewed pertinent patient test results.  The assessment, diagnosis, and plan were formulated together and I agree with the documentation in the resident's note.  Rayonna Heldman, MD  

## 2023-06-21 ENCOUNTER — Other Ambulatory Visit: Payer: Self-pay

## 2023-06-24 ENCOUNTER — Other Ambulatory Visit (HOSPITAL_COMMUNITY): Payer: Self-pay

## 2023-06-27 ENCOUNTER — Other Ambulatory Visit: Payer: Medicaid Other | Admitting: Licensed Clinical Social Worker

## 2023-06-27 NOTE — Patient Outreach (Signed)
Medicaid Managed Care Social Work Note  06/27/2023 Name:  [name] MRN:  [id number] DOB:  November 27, 1971  [name] is an 52 y.o. year old female who is a primary patient of Laurie Sheldon, MD.  The [facility] Managed Care Coordination team was consulted for assistance with:  Grief Counseling  [name] was given information about Medicaid Managed Care Coordination team services today. [name] Patient agreed to services and verbal consent obtained.  Engaged with patient  for by telephone forinitial visit in response to referral for case management and/or care coordination services.   Assessments/Interventions:  Review of past medical history, allergies, medications, health status, including review of consultants reports, laboratory and other test data, was performed as part of comprehensive evaluation and provision of chronic care management services.  SDOH: (Social Determinant of Health) assessments and interventions performed: SDOH Interventions    Flowsheet Row Patient Outreach Telephone from 06/27/2023 in [place] [facility] Office Visit from 06/17/2023 in [facility] Office Visit from 08/27/2019 in [facility] Office Visit from 01/30/2018 in [facility] Office Visit from 06/28/2016 in [facility] Office Visit from 04/20/2015 in [facility]  SDOH Interventions        Transportation Interventions -- Other (Comment)  [rides bus] -- -- -- --  Depression Interventions/Treatment  Counseling  [Pt is interested in gaining grief therapy] -- Medication Currently on Treatment Counseling  [goes to [facility]] Currently on Treatment  Stress Interventions [employer], Provide Counseling -- -- -- -- --       Advanced Directives Status:  See Care Plan for related entries.  Care Plan                 No Known  Allergies  Medications Reviewed Today     Reviewed by Laurie Macadamia, MD (Resident) on 06/17/23 at 1204  Med List Status: <None>   Medication Order Taking? Sig Documenting Provider Last Dose Status Informant  albuterol (VENTOLIN HFA) 108 (90 Base) MCG/ACT inhaler [id number]  Inhale 2 puffs into the lungs every 6 (six) hours as needed for wheezing or shortness of breath. Laurie Macadamia, MD  Active   amLODipine (NORVASC) 5 MG tablet [id number]  Take 1 tablet (5 mg total) by mouth daily. Laurie Macadamia, MD  Active   cetirizine (ZYRTEC ALLERGY) 10 MG tablet [id number] Yes Take 1 tablet (10 mg total) by mouth daily. Laurie Macadamia, MD  Active   fluconazole (DIFLUCAN) 150 MG tablet [id number]  Take 1 tablet (150 mg total) by mouth daily. Steffanie Rainwater, MD  Active   fluticasone (FLONASE SENSIMIST) 27.5 MCG/SPRAY nasal spray [id number]  Place 2 sprays into the nose daily as needed for rhinitis. Rehman, Areeg N, DO  Active   fluticasone (FLONASE) 50 MCG/ACT nasal spray [id number]  Place 1 spray into both nostrils daily. Laurie Macadamia, MD  Active   hydrOXYzine (ATARAX) 25 MG tablet [id number]  TAKE 1 TABLET(25 MG) BY MOUTH EVERY 8 HOURS AS NEEDED FOR ANXIETY Laurie Macadamia, MD  Active   ketotifen (ZADITOR) 0.025 % ophthalmic solution [id number]  Place 1 drop into both eyes 2 (two) times daily. Rehman, Areeg N, DO  Active   mirtazapine (REMERON) 15 MG tablet [id number] Yes Take 1 tablet (15 mg total) by mouth at bedtime. Laurie Macadamia, MD  Active   montelukast (SINGULAIR) 10 MG tablet [id number]  Take 1 tablet (10 mg total) by mouth  at bedtime. Laurie Macadamia, MD  Active   Multiple Vitamins-Minerals (ONE-A-DAY 50 PLUS PO) [id number] No Take by mouth. [provider] Taking Active   QUEtiapine (SEROQUEL) 25 MG tablet [id number] Yes Take 1 tablet (25 mg total) by mouth at bedtime. Laurie Macadamia, MD  Active   Vitamin D, Cholecalciferol, 25 MCG (1000 UT) TABS [id number]   Take 1 tablet by mouth daily. 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   Vaginal candidiasis 01/03/2023   Elevated LFTs 01/01/2022   STI (sexually transmitted infection) 06/27/2021   Amenorrhea 06/26/2021   Tobacco abuse 01/30/2018   Bipolar disorder (HCC) 06/14/2016   Vitamin D deficiency 02/04/2016   Encounter for screening for cervical cancer 03/17/2015   Healthcare maintenance 03/17/2015   Anxiety disorder 06/04/2014   Asthma in adult 06/04/2014   Hypertension 06/04/2014   Chronic allergic rhinitis 06/04/2014    Conditions to be addressed/monitored per PCP order:  Bipolar Disorder  Care Plan : LCSW Plan of Care  Updates made by Laurie Bryant, LCSW since 06/27/2023 12:00 AM     Problem: Depression Identification (Depression)      Goal: Grief Symptoms Identified   Note:   Priority: High  Timeframe:  Short-Range Goal Priority:  High Start Date:   06/27/23              Expected End Date:  ongoing                     Follow Up Date--07/11/23 at 2 pm  - check out bereavement counseling and long term counseling options -resources sent to you by email  - keep 90 percent of counseling appointments - schedule initial counseling appointment    Why is this important?             Beating grief and depression may take some time.            If you don't feel better right away, don't give up on your treatment plan.    Current barriers:   Chronic Mental Health needs related to bipolar and grief from losing her sister in May of 2024. Patient requires Support, Education, Resources, Referrals, Advocacy, and Care Coordination, in order to meet Unmet Mental Health Needs. Patient will implement clinical interventions discussed today to decrease symptoms of grief and increase knowledge and/or ability of: coping skills. Mental Health Concerns and Social  Isolation Patient lacks knowledge of available community counseling agencies and resources.  Clinical Goal(s): verbalize understanding of plan for management of Grief, Bipolar, Depression, and Stress and demonstrate a reduction in symptoms. Patient will connect with a provider for ongoing mental health treatment, increase coping skills, healthy habits, self-management skills, and stress reduction        Clinical Interventions:  Assessed patient's previous and current treatment, coping skills, support system and barriers to care. Patient recently lost her sister tragically to cancer after two weeks of finding out. Patient lost her mother in 2016. She has an amazing support system of family members. She resides with her father and brother.  Verbalization of feelings encouraged, motivational interviewing employed Emotional support provided, positive coping strategies explored Self care emphasized Patient reports significant worsening grief impacting their ability to function appropriately and carry out daily task. Patient receives strong support from her children as well who recently  got her a new shih tzu puppy named buttercup. LCSW provided education on relaxation techniques such as meditation, deep breathing, massage, grounding exercises or yoga that can activate the body's relaxation response and ease symptoms of stress and anxiety. LCSW ask that when pt is struggling with difficult emotions and racing thoughts that they start this relaxation response process. LCSW provided extensive education on healthy coping skills for anxiety. SW used active and reflective listening, validated patient's feelings/concerns, and provided emotional support. Patient will work on implementing appropriate self-care habits into their daily routine such as: staying positive, writing a gratitude list, drinking water, staying active around the house, taking their medications as prescribed, combating negative thoughts or  emotions and staying connected with their family and friends. Positive reinforcement provided for this decision to work on this. Patient has issues with sleep. LCSW provided education on healthy sleep hygiene and what that looks like. LCSW encouraged patient to implement a night time routine into their schedule that works best for them and that they are able to maintain. Advised patient to implement deep breathing/grounding/meditation/self-care exercises into their nightly routine to combat racing thoughts at night. LCSW encouraged patient to wake up at the same time each day, make their sleeping environment comfortable, exercise when able, to limit naps and to not eat or drink anything right before bed.  Motivational Interviewing employed Depression screen reviewed  PHQ2/ PHQ9 completed Mindfulness or Relaxation training provided Active listening / Reflection utilized  Advance Care and HCPOA education provided Emotional Support Provided Problem Solving /Task Center strategies reviewed Provided psychoeducation for mental health needs  Provided brief CBT  Reviewed mental health medications and discussed importance of compliance:  Quality of sleep assessed & Sleep Hygiene techniques promoted  Participation in counseling encouraged  Verbalization of feelings encouraged  Suicidal Ideation/Homicidal Ideation assessed: Patient denies SI/HI  Review resources, discussed options and provided patient information about  Mental Health Resources Inter-disciplinary care team collaboration (see longitudinal plan of care) Patient wishes for grief support/therapy resources to be mailed to her address instead of email. She will review these resources and make a decision on where she wishes to gain services at. [facility] LCSW will assist with this grief support and program enrollment process. [facility] LCSW mailed resources on 06/27/23.   Patient Goals/Self-Care Activities: Over the next 120 days Attend scheduled medical  appointments Utilize healthy coping skills and supportive resources discussed Contact PCP with any questions or concerns Keep 90 percent of counseling appointments Call your insurance provider for more information about your Enhanced Benefits  Check out counseling resources provided  Begin personal counseling with LCSW, to reduce and manage symptoms of Depression and Stress, until well-established with mental health provider Accept all calls from mental health representatives as an effort to establish ongoing mental health counseling and supportive grief services.  Incorporate into daily practice - relaxation techniques, deep breathing exercises, and mindfulness meditation strategies. Talk about feelings with friends, family members, spiritual advisor, etc. Contact LCSW directly [phone]), if you have questions, need assistance, or if additional social work needs are identified between now and our next scheduled telephone outreach call. Call [phone] for mental health hotline/crisis line if needed (24/7 available) Try techniques to reduce symptoms of anxiety/negative thinking (deep breathing, distraction, positive self talk, etc)  - develop a personal safety plan - develop a plan to deal with triggers like holidays, anniversaries - exercise at least 2 to 3 times per week - have a plan for how to handle bad days - journal feelings  and what helps to feel better or worse - spend time or talk with others at least 2 to 3 times per week - watch for early signs of feeling worse - begin personal counseling - call and visit an old friend - check out volunteer opportunities - join a support group - laugh; watch a funny movie or comedian - learn and use visualization or guided imagery - perform a random act of kindness - practice relaxation or meditation daily - start or continue a personal journal - practice positive thinking and self-talk -continue with compliance of taking medication   -identify current effective and ineffective coping strategies.  -implement positive self-talk in care to increase self-esteem, confidence and feelings of control.  -consider alternative and complementary therapy approaches such as meditation, mindfulness or yoga.  -journaling, prayer, worship services, meditation or pastoral counseling.  -increase participation in pleasurable group activities such as hobbies, singing, sports or volunteering).  -consider the use of meditative movement therapy such as tai chi, yoga or qigong.  -start a regular daily exercise program based on tolerance, ability and patient choice to support positive thinking and activity     Managing Loss, Adult People experience loss in many different ways throughout their lives. Events such as moving, changing jobs, and losing friends can create a sense of loss. The loss may be as serious as a major health change, divorce, death of a pet, or death of a loved one. All of these types of loss are likely to create a physical and emotional reaction known as grief. Grief is the result of a major change or an absence of something or someone that you count on. Grief is a normal reaction to loss. A variety of factors can affect your grieving experience, including: The nature of your loss. Your relationship to what or whom you lost. Your understanding of grief and how to manage it. Your support system. How to manage lifestyle changes Keep to your normal routine as much as possible. If you have trouble focusing or doing normal activities, it is acceptable to take some time away from your normal routine. Spend time with friends and loved ones. Eat a healthy diet, get plenty of sleep, and rest when you feel tired. How to recognize changes  The way that you deal with your grief will affect your ability to function as you normally do. When grieving, you may experience these changes: Numbness, shock, sadness, anxiety, anger, denial, and  guilt. Thoughts about death. Unexpected crying. A physical sensation of emptiness in your stomach. Problems sleeping and eating. Tiredness (fatigue). Loss of interest in normal activities. Dreaming about or imagining seeing the person who died. A need to remember what or whom you lost. Difficulty thinking about anything other than your loss for a period of time. Relief. If you have been expecting the loss for a while, you may feel a sense of relief when it happens. Follow these instructions at home:    Activity Express your feelings in healthy ways, such as: Talking with others about your loss. It may be helpful to find others who have had a similar loss, such as a support group. Writing down your feelings in a journal. Doing physical activities to release stress and emotional energy. Doing creative activities like painting, sculpting, or playing or listening to music. Practicing resilience. This is the ability to recover and adjust after facing challenges. Reading some resources that encourage resilience may help you to learn ways to practice those behaviors. General instructions Be  patient with yourself and others. Allow the grieving process to happen, and remember that grieving takes time. It is likely that you may never feel completely done with some grief. You may find a way to move on while still cherishing memories and feelings about your loss. Accepting your loss is a process. It can take months or longer to adjust. Keep all follow-up visits as told by your health care provider. This is important. Where to find support To get support for managing loss: Ask your health care provider for help and recommendations, such as grief counseling or therapy. Think about joining a support group for people who are managing a loss. Where to find more information You can find more information about managing loss from: American Society of Clinical Oncology: www.cancer.net American  Psychological Association: [employer] Contact a health care provider if: Your grief is extreme and keeps getting worse. You have ongoing grief that does not improve. Your body shows symptoms of grief, such as illness. You feel depressed, anxious, or lonely. Get help right away if: You have thoughts about hurting yourself or others. If you ever feel like you may hurt yourself or others, or have thoughts about taking your own life, get help right away. You can go to your nearest emergency department or call: Your local emergency services (911 in the [place] A suicide crisis helpline, such as the National Suicide Prevention Lifeline at (548) 409-1157. This is open 24 hours a day. Summary Grief is the result of a major change or an absence of someone or something that you count on. Grief is a normal reaction to loss. The depth of grief and the period of recovery depend on the type of loss and your ability to adjust to the change and process your feelings. Processing grief requires patience and a willingness to accept your feelings and talk about your loss with people who are supportive. It is important to find resources that work for you and to realize that people experience grief differently. There is not one grieving process that works for everyone in the same way. Be aware that when grief becomes extreme, it can lead to more severe issues like isolation, depression, anxiety, or suicidal thoughts. Talk with your health care provider if you have any of these issues. This information is not intended to replace advice given to you by your health care provider. Make sure you discuss any questions you have with your health care provider. Document Revised: 01/30/2019 Document Reviewed: 04/11/2017 Elsevier Patient Education  2020 [employer].  Help for Managing Grief   When you are experiencing grief, many resources and support are available to help you. You are not alone.    Grief Share  (https://www.SunglassSpecialist.gl):    [employer] of [address] [place], [place]      [facility]  [phone] [place], [place]     [facility] [address] [place], [place]      [facility] [address] [place], [place]  [phone]   [facility] [address] [place], [place] [phone]   [facility] [address] [place], [place]     [phone]   [facility] [address] [place], [place]     [phone]   If a Hospice or Palliative Care team has been involved in the care of your loved one, you may reach out to your contact there or locally, you may  reach out to [facility]:    [facility] [place] [facility]   [address] [place], [place] [address] [phone]   [facility]  [facility] in [place]  [address] [place], [place], [place] [address] Phone: [phone]      10 LITTLE Things To Do When You're Feeling Too Down To Do Anything  Take a shower. Even if you plan to stay in all day long and not see a soul, take a shower. It takes the most effort to hop in to the shower but once you do, you'll feel immediate results. It will wake you up and you'll be feeling much fresher (and cleaner too).  Brush and floss your teeth. Give your teeth a good brushing with a floss finish. It's a small task but it feels so good and you can check 'taking care of your health' off the list of things to do.  Do something small on your list. Most of [place] have some small thing we would like to get done (load of laundry, sew a button, email a friend). Doing one of these things will make you feel like you've accomplished something.  Drink water. Drinking water is easy right? It's also really beneficial for your health so keep a glass beside you all day and take sips often. It  gives you energy and prevents you from boredom eating.  Do some floor exercises. The last thing you want to do is exercise but it might be just the thing you need the most. Keep it simple and do exercises that involve sitting or laying on the floor. Even the smallest of exercises release chemicals in the brain that make you feel good. Yoga stretches or core exercises are going to make you feel good with minimal effort.  Make your bed. Making your bed takes a few minutes but it's productive and you'll feel relieved when it's done. An unmade bed is a huge visual reminder that you're having an unproductive day. Do it and consider it your housework for the day.  Put on some nice clothes. Take the sweatpants off even if you don't plan to go anywhere. Put on clothes that make you feel good. Take a look in the mirror so your brain recognizes the sweatpants have been replaced with clothes that make you look great. It's an instant confidence booster.  Wash the dishes. A pile of dirty dishes in the sink is a reflection of your mood. It's possible that if you wash up the dishes, your mood will follow suit. It's worth a try.  Cook a real meal. If you have the luxury to have a "do nothing" day, you have time to make a real meal for yourself. Make a meal that you love to eat. The process is good to get you out of the funk and the food will ensure you have more energy for tomorrow.  Write out your thoughts by hand. When you hand write, you stimulate your brain to focus on the moment that you're in so make yourself comfortable and write whatever comes into your mind. Put those thoughts out on paper so they stop spinning around in your head. Those thoughts might be the very thing holding you down.     06/17/2023    9:59 AM  GAD 7 : Generalized Anxiety Score  Nervous, Anxious, on Edge 1  Control/stop worrying 1  Worry too much - different things 1  Trouble relaxing 1  Restless 1  Easily annoyed or irritable  1  Afraid - awful might happen 1  Total GAD 7 Score 7       06/27/2023    4:47 PM 06/17/2023    9:58 AM 01/03/2023    9:10 AM 01/01/2022   11:27 AM 06/26/2021    2:53 PM  Depression screen PHQ 2/9  Decreased Interest 2 0 0 1 1  Down, Depressed, Hopeless 2 1 0 3 1  PHQ - 2 Score 4 1 0 4 2  Altered sleeping 2 1 1 1 1   Tired, decreased energy 1 1 0 0 2  Change in appetite 3 3 0 0 1  Feeling bad or failure about yourself  1 1 0 1 2  Trouble concentrating 0 1 0 0 2  Moving slowly or fidgety/restless 0 1 0 1 1  Suicidal thoughts 0 1 0 0 0  PHQ-9 Score 11 10 1 7 11   Difficult doing work/chores Very difficult  Not difficult at all Somewhat difficult Somewhat difficult    Follow up:  Patient agrees to Care Plan and Follow-up.  Plan: The Managed Medicaid care management team will reach out to the patient again over the next 30 days.  Dickie La, BSW, MSW, [employer] Managed Medicaid LCSW [facility]  [facility] [email or web address] Phone: [phone]

## 2023-06-27 NOTE — Patient Instructions (Signed)
Visit Information  Ms. Matarazzo was given information about Medicaid Managed Care team care coordination services as a part of their Healthy [facility] Medicaid benefit. [name] verbally consented to engagement with the [facility] Managed Care team.   If you are experiencing a medical emergency, please call 911 or report to your local emergency department or urgent care.   If you have a non-emergency medical problem during routine business hours, please contact your provider's office and ask to speak with a nurse.   For questions related to your Healthy [facility] health plan, please call: [phone] or visit the homepage here: [email or web address]  If you would like to schedule transportation through your Healthy [facility] plan, please call the following number at least 2 days in advance of your appointment: [phone]  For information about your ride after you set it up, call Ride Assist at [phone]. Use this number to activate a Will Call pickup, or if your transportation is late for a scheduled pickup. Use this number, too, if you need to make a change or cancel a previously scheduled reservation.  If you need transportation services right away, call [phone]. The after-hours call center is staffed 24 hours to handle ride assistance and urgent reservation requests (including discharges) 365 days a year. Urgent trips include sick visits, hospital discharge requests and life-sustaining treatment.  Call the [facility] Line at [phone], at any time, 24 hours a day, 7 days a week. If you are in danger or need immediate medical attention call 911.  If you would like help to quit smoking, call 1-800-QUIT-NOW ((938) 396-0781) OR Espaol: 1-855-Djelo-Ya (6-063-016-0109) o para ms informacin haga clic aqu or Text READY to 323-557 to register via text   Following is a copy of your plan of care:  Care Plan : LCSW Plan of Care   Updates made by [name], LCSW since 06/27/2023 12:00 AM     Problem: Depression Identification (Depression)      Goal: Grief Symptoms Identified   Note:   Priority: High  Timeframe:  Short-Range Goal Priority:  High Start Date:   06/27/23              Expected End Date:  ongoing                     Follow Up Date--07/11/23 at 2 pm  - check out bereavement counseling and long term counseling options -resources sent to you by email  - keep 90 percent of counseling appointments - schedule initial counseling appointment    Why is this important?             Beating grief and depression may take some time.            If you don't feel better right away, don't give up on your treatment plan.    Current barriers:   Chronic Mental Health needs related to bipolar and grief from losing her sister in May of 2024. Patient requires Support, Education, Resources, Referrals, Advocacy, and Care Coordination, in order to meet Unmet Mental Health Needs. Patient will implement clinical interventions discussed today to decrease symptoms of grief and increase knowledge and/or ability of: coping skills. Mental Health Concerns and Social Isolation Patient lacks knowledge of available community counseling agencies and resources.  Clinical Goal(s): verbalize understanding of plan for management of Grief, Bipolar, Depression, and Stress and demonstrate a reduction in symptoms. Patient will connect with a provider for ongoing mental health treatment, increase  coping skills, healthy habits, self-management skills, and stress reduction        Patient Goals/Self-Care Activities: Over the next 120 days Attend scheduled medical appointments Utilize healthy coping skills and supportive resources discussed Contact PCP with any questions or concerns Keep 90 percent of counseling appointments Call your insurance provider for more information about your Enhanced Benefits  Check out counseling resources  provided  Begin personal counseling with LCSW, to reduce and manage symptoms of Depression and Stress, until well-established with mental health provider Accept all calls from mental health representatives as an effort to establish ongoing mental health counseling and supportive grief services.  Incorporate into daily practice - relaxation techniques, deep breathing exercises, and mindfulness meditation strategies. Talk about feelings with friends, family members, spiritual advisor, etc. Contact LCSW directly [phone]), if you have questions, need assistance, or if additional social work needs are identified between now and our next scheduled telephone outreach call. Call 988 for mental health hotline/crisis line if needed (24/7 available) Try techniques to reduce symptoms of anxiety/negative thinking (deep breathing, distraction, positive self talk, etc)  - develop a personal safety plan - develop a plan to deal with triggers like holidays, anniversaries - exercise at least 2 to 3 times per week - have a plan for how to handle bad days - journal feelings and what helps to feel better or worse - spend time or talk with others at least 2 to 3 times per week - watch for early signs of feeling worse - begin personal counseling - call and visit an old friend - check out volunteer opportunities - join a support group - laugh; watch a funny movie or comedian - learn and use visualization or guided imagery - perform a random act of kindness - practice relaxation or meditation daily - start or continue a personal journal - practice positive thinking and self-talk -continue with compliance of taking medication  -identify current effective and ineffective coping strategies.  -implement positive self-talk in care to increase self-esteem, confidence and feelings of control.  -consider alternative and complementary therapy approaches such as meditation, mindfulness or yoga.  -journaling,  prayer, worship services, meditation or pastoral counseling.  -increase participation in pleasurable group activities such as hobbies, singing, sports or volunteering).  -consider the use of meditative movement therapy such as tai chi, yoga or qigong.  -start a regular daily exercise program based on tolerance, ability and patient choice to support positive thinking and activity     Managing Loss, Adult People experience loss in many different ways throughout their lives. Events such as moving, changing jobs, and losing friends can create a sense of loss. The loss may be as serious as a major health change, divorce, death of a pet, or death of a loved one. All of these types of loss are likely to create a physical and emotional reaction known as grief. Grief is the result of a major change or an absence of something or someone that you count on. Grief is a normal reaction to loss. A variety of factors can affect your grieving experience, including: The nature of your loss. Your relationship to what or whom you lost. Your understanding of grief and how to manage it. Your support system. How to manage lifestyle changes Keep to your normal routine as much as possible. If you have trouble focusing or doing normal activities, it is acceptable to take some time away from your normal routine. Spend time with friends and loved ones. Eat a healthy diet,  get plenty of sleep, and rest when you feel tired. How to recognize changes  The way that you deal with your grief will affect your ability to function as you normally do. When grieving, you may experience these changes: Numbness, shock, sadness, anxiety, anger, denial, and guilt. Thoughts about death. Unexpected crying. A physical sensation of emptiness in your stomach. Problems sleeping and eating. Tiredness (fatigue). Loss of interest in normal activities. Dreaming about or imagining seeing the person who died. A need to remember what or whom you  lost. Difficulty thinking about anything other than your loss for a period of time. Relief. If you have been expecting the loss for a while, you may feel a sense of relief when it happens. Follow these instructions at home:    Activity Express your feelings in healthy ways, such as: Talking with others about your loss. It may be helpful to find others who have had a similar loss, such as a support group. Writing down your feelings in a journal. Doing physical activities to release stress and emotional energy. Doing creative activities like painting, sculpting, or playing or listening to music. Practicing resilience. This is the ability to recover and adjust after facing challenges. Reading some resources that encourage resilience may help you to learn ways to practice those behaviors. General instructions Be patient with yourself and others. Allow the grieving process to happen, and remember that grieving takes time. It is likely that you may never feel completely done with some grief. You may find a way to move on while still cherishing memories and feelings about your loss. Accepting your loss is a process. It can take months or longer to adjust. Keep all follow-up visits as told by your health care provider. This is important. Where to find support To get support for managing loss: Ask your health care provider for help and recommendations, such as grief counseling or therapy. Think about joining a support group for people who are managing a loss. Where to find more information You can find more information about managing loss from: American Society of Clinical Oncology: www.cancer.net American Psychological Association: DiceTournament.ca Contact a health care provider if: Your grief is extreme and keeps getting worse. You have ongoing grief that does not improve. Your body shows symptoms of grief, such as illness. You feel depressed, anxious, or lonely. Get help right away if: You have  thoughts about hurting yourself or others. If you ever feel like you may hurt yourself or others, or have thoughts about taking your own life, get help right away. You can go to your nearest emergency department or call: Your local emergency services (911 in the [place] A suicide crisis helpline, such as the National Suicide Prevention Lifeline at 814-734-8843. This is open 24 hours a day. Summary Grief is the result of a major change or an absence of someone or something that you count on. Grief is a normal reaction to loss. The depth of grief and the period of recovery depend on the type of loss and your ability to adjust to the change and process your feelings. Processing grief requires patience and a willingness to accept your feelings and talk about your loss with people who are supportive. It is important to find resources that work for you and to realize that people experience grief differently. There is not one grieving process that works for everyone in the same way. Be aware that when grief becomes extreme, it can lead to more severe issues like isolation, depression,  anxiety, or suicidal thoughts. Talk with your health care provider if you have any of these issues. This information is not intended to replace advice given to you by your health care provider. Make sure you discuss any questions you have with your health care provider. Document Revised: 01/30/2019 Document Reviewed: 04/11/2017 Elsevier Patient Education  2020 [employer].  Help for Managing Grief   When you are experiencing grief, many resources and support are available to help you. You are not alone.    Grief Share (https://www.SunglassSpecialist.gl):    [employer] of [address] [place], [place]      [employer]  [phone] [address] [place], [place]     [facility] [address] [place], [place]      [facility] [address] [place], [place]  [phone]   [facility] [address] [place], [place] [phone]   [facility] [address] [place], [place]     [phone]   [facility] [address] [place], [place]     [phone]   If a Hospice or Palliative Care team has been involved in the care of your loved one, you may reach out to your contact there or locally, you may reach out to [facility]:    [facility] [place] [facility]   [address] [place], [place] [address] [phone]   [facility] in [place], [place]  [address], [place], [place] [address] Phone: [phone]     24- Hour Availability:    [facility]  [address] [place], [place] [phone] Crisis [phone]   [facility] [phone]  [place] [facility]  [phone]    [facility]  [phone] (after hours)   Therapeutic Alternative/Mobile Crisis   6101330771   [place] National Suicide Hotline  607-332-9782 Len Child[place]) [place] 854   Call 38 for mental health emergencies   [facility]  806-223-0752);  [place] and [employer]  [phone]); [place], [place], [place], [place], [place], [place], [place]    [place] [facility] for [facility] Residents For 24/7 walk-up access to mental health services for [facility] children (4+), adolescents and adults, please visit the [facility] located at [address] in [place], [place].  *[facility] also provides comprehensive outpatient behavioral health services in a variety of locations around the [place].  Connect With [place] [address] [place], [place] [address] HelpLine: 228-536-0331 or 1-574-494-6301  Get Directions  Find Help 24/7 By Phone Call our  24-hour HelpLine at (252) 017-5842 or 619-469-6829 for immediate assistance for mental health and substance abuse issues.  Walk-In Help [place]: [facility] (Ages 4 and Up)  [place]: Emergency Dept., [facility] Additional Resources National Hopeline Network: 1-800-SUICIDE The National Suicide Prevention Lifeline: 1-800-273-TALK     [place] in there [place], you GOT this!!!!  Dickie La, BSW, MSW, LCSW Managed Medicaid LCSW [facility]  [facility] [email or web address] Phone: [phone]

## 2023-07-04 ENCOUNTER — Ambulatory Visit: Payer: Medicaid Other | Admitting: Licensed Clinical Social Worker

## 2023-07-04 DIAGNOSIS — F4321 Adjustment disorder with depressed mood: Secondary | ICD-10-CM

## 2023-07-04 NOTE — BH Specialist Note (Signed)
  [facility] received referral for patient. Patient is already being followed by manage medicaid LCSW J. Brooke. Patient has a follow up with LCSW on Date--07/11/23 at 2 pm. [facility] removed herself from patients care team and notified LCSW J. Brooke via teams.   Christen Butter, MSW, LCSW-A She/Her Behavioral Health Clinician [facility]  Internal Medicine Center Direct Dial:[phone]  Fax [phone] Main Office Phone: [phone] [address] [place] [place], [place] [address] Website: [facility] Internal Medicine [facility]  [place], [place]  [place]

## 2023-07-11 ENCOUNTER — Other Ambulatory Visit: Payer: Medicaid Other | Admitting: Licensed Clinical Social Worker

## 2023-07-11 NOTE — Patient Outreach (Signed)
Medicaid Managed Care Social Work Note  07/11/2023 Name:  [name] MRN:  [id number] DOB:  Sep 18, 1971  [name] is an 52 y.o. year old female who is a primary patient of Morrie Sheldon, MD.  The [facility] Managed Care Coordination team was consulted for assistance with:  Grief Counseling  Ms. Zaccagnino was given information about Medicaid Managed Care Coordination team services today. [name] Patient agreed to services and verbal consent obtained.  Engaged with patient  for by telephone forfollow up visit in response to referral for case management and/or care coordination services.   Assessments/Interventions:  Review of past medical history, allergies, medications, health status, including review of consultants reports, laboratory and other test data, was performed as part of comprehensive evaluation and provision of chronic care management services.  SDOH: (Social Determinant of Health) assessments and interventions performed: SDOH Interventions    Flowsheet Row Patient Outreach Telephone from 07/11/2023 in [place] POPULATION HEALTH DEPARTMENT Patient Outreach Telephone from 06/27/2023 in [place] POPULATION HEALTH DEPARTMENT Office Visit from 06/17/2023 in [facility] Office Visit from 08/27/2019 in [facility] Office Visit from 01/30/2018 in [facility] Office Visit from 06/28/2016 in [facility]  SDOH Interventions        Transportation Interventions -- -- Other (Comment)  [rides bus] -- -- --  Depression Interventions/Treatment  -- Counseling  [Pt is interested in gaining grief therapy] -- Medication Currently on Treatment Counseling  [goes to [facility]]  Stress Interventions Offered [employer], Provide Counseling Offered [employer], Provide Counseling -- -- -- --       Advanced Directives Status:  See Care Plan for related  entries.  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   Vaginal candidiasis 01/03/2023   Elevated LFTs 01/01/2022   STI (sexually transmitted infection) 06/27/2021   Amenorrhea 06/26/2021   Tobacco abuse 01/30/2018   Bipolar disorder (HCC) 06/14/2016   Vitamin D deficiency 02/04/2016   Encounter for screening for cervical cancer 03/17/2015   Healthcare maintenance 03/17/2015   Anxiety disorder 06/04/2014   Asthma in adult 06/04/2014   Hypertension 06/04/2014   Chronic allergic rhinitis 06/04/2014    Conditions to be addressed/monitored per PCP order:  Depression  Care Plan : LCSW Plan of Care  Updates made by [name], LCSW since 07/11/2023 12:00 AM     Problem: Depression Identification (Depression)      Goal: Grief Symptoms Identified   Note:   Priority: High  Timeframe:  Short-Range Goal Priority:  High Start Date:   06/27/23              Expected End Date:  ongoing                     Follow Up Date--07/25/23 at 230 pm  - check out bereavement counseling and long term counseling options -resources sent to you by email  - keep 90 percent of counseling appointments - schedule initial counseling appointment    Why is this important?             Beating grief and depression may take some  time.            If you don't feel better right away, don't give up on your treatment plan.    Current barriers:   Chronic Mental Health needs related to bipolar and grief from losing her sister in May of 2024. Patient requires Support, Education, Resources, Referrals, Advocacy, and Care Coordination, in order to meet Unmet Mental Health Needs. Patient will implement clinical interventions discussed today to decrease symptoms of grief and increase knowledge and/or ability of:  coping skills. Mental Health Concerns and Social Isolation Patient lacks knowledge of available community counseling agencies and resources.  Clinical Goal(s): verbalize understanding of plan for management of Grief, Bipolar, Depression, and Stress and demonstrate a reduction in symptoms. Patient will connect with a provider for ongoing mental health treatment, increase coping skills, healthy habits, self-management skills, and stress reduction        Clinical Interventions:  Assessed patient's previous and current treatment, coping skills, support system and barriers to care. Patient recently lost her sister tragically to cancer after two weeks of finding out. Patient lost her mother in 2016. She has an amazing support system of family members. She resides with her father and brother.  Verbalization of feelings encouraged, motivational interviewing employed Emotional support provided, positive coping strategies explored Self care emphasized Patient reports significant worsening grief impacting their ability to function appropriately and carry out daily task. Patient receives strong support from her children as well who recently got her a new shih tzu puppy named buttercup. LCSW provided education on relaxation techniques such as meditation, deep breathing, massage, grounding exercises or yoga that can activate the body's relaxation response and ease symptoms of stress and anxiety. LCSW ask that when pt is struggling with difficult emotions and racing thoughts that they start this relaxation response process. LCSW provided extensive education on healthy coping skills for anxiety. SW used active and reflective listening, validated patient's feelings/concerns, and provided emotional support. Patient will work on implementing appropriate self-care habits into their daily routine such as: staying positive, writing a gratitude list, drinking water, staying active around the house, taking their medications  as prescribed, combating negative thoughts or emotions and staying connected with their family and friends. Positive reinforcement provided for this decision to work on this. Patient has issues with sleep. LCSW provided education on healthy sleep hygiene and what that looks like. LCSW encouraged patient to implement a night time routine into their schedule that works best for them and that they are able to maintain. Advised patient to implement deep breathing/grounding/meditation/self-care exercises into their nightly routine to combat racing thoughts at night. LCSW encouraged patient to wake up at the same time each day, make their sleeping environment comfortable, exercise when able, to limit naps and to not eat or drink anything right before bed.  Motivational Interviewing employed Depression screen reviewed  PHQ2/ PHQ9 completed Mindfulness or Relaxation training provided Active listening / Reflection utilized  Advance Care and HCPOA education provided Emotional Support Provided Problem Solving /Task Center strategies reviewed Provided psychoeducation for mental health needs  Provided brief CBT  Reviewed mental health medications and discussed importance of compliance:  Quality of sleep assessed & Sleep Hygiene techniques promoted  Participation in counseling encouraged  Verbalization of feelings encouraged  Suicidal Ideation/Homicidal Ideation assessed: Patient denies SI/HI  Review resources, discussed options and provided patient information about  Mental Health Resources Inter-disciplinary care team collaboration (see longitudinal plan of care) Patient wishes for grief support/therapy resources to be mailed to her  address instead of email. She will review these resources and make a decision on where she wishes to gain services at. [facility] LCSW will assist with this grief support and program enrollment process. [facility] LCSW mailed resources on 06/27/23. Update - Patient successfully received  resources that were mailed out to her but reports that she just got them Monday and would like two more weeks to review. She reports also needing food and financial support so an additional referral was made for [facility]. Brief emotional support and coping skill education provided to patient today.   Patient Goals/Self-Care Activities: Over the next 120 days Attend scheduled medical appointments Utilize healthy coping skills and supportive resources discussed Contact PCP with any questions or concerns Keep 90 percent of counseling appointments Call your insurance provider for more information about your Enhanced Benefits  Check out counseling resources provided  Begin personal counseling with LCSW, to reduce and manage symptoms of Depression and Stress, until well-established with mental health provider Accept all calls from mental health representatives as an effort to establish ongoing mental health counseling and supportive grief services.  Incorporate into daily practice - relaxation techniques, deep breathing exercises, and mindfulness meditation strategies. Talk about feelings with friends, family members, spiritual advisor, etc. Contact LCSW directly [phone]), if you have questions, need assistance, or if additional social work needs are identified between now and our next scheduled telephone outreach call. Call [phone] for mental health hotline/crisis line if needed (24/7 available) Try techniques to reduce symptoms of anxiety/negative thinking (deep breathing, distraction, positive self talk, etc)  - develop a personal safety plan - develop a plan to deal with triggers like holidays, anniversaries - exercise at least 2 to 3 times per week - have a plan for how to handle bad days - journal feelings and what helps to feel better or worse - spend time or talk with others at least 2 to 3 times per week - watch for early signs of feeling worse - begin personal counseling - call and  visit an old friend - check out volunteer opportunities - join a support group - laugh; watch a funny movie or comedian - learn and use visualization or guided imagery - perform a random act of kindness - practice relaxation or meditation daily - start or continue a personal journal - practice positive thinking and self-talk -continue with compliance of taking medication  -identify current effective and ineffective coping strategies.  -implement positive self-talk in care to increase self-esteem, confidence and feelings of control.  -consider alternative and complementary therapy approaches such as meditation, mindfulness or yoga.  -journaling, prayer, worship services, meditation or pastoral counseling.  -increase participation in pleasurable group activities such as hobbies, singing, sports or volunteering).  -consider the use of meditative movement therapy such as tai chi, yoga or qigong.  -start a regular daily exercise program based on tolerance, ability and patient choice to support positive thinking and activity     Managing Loss, Adult People experience loss in many different ways throughout their lives. Events such as moving, changing jobs, and losing friends can create a sense of loss. The loss may be as serious as a major health change, divorce, death of a pet, or death of a loved one. All of these types of loss are likely to create a physical and emotional reaction known as grief. Grief is the result of a major change or an absence of something or someone that you count on. Grief is a normal reaction to loss.  A variety of factors can affect your grieving experience, including: The nature of your loss. Your relationship to what or whom you lost. Your understanding of grief and how to manage it. Your support system. How to manage lifestyle changes Keep to your normal routine as much as possible. If you have trouble focusing or doing normal activities, it is acceptable to take  some time away from your normal routine. Spend time with friends and loved ones. Eat a healthy diet, get plenty of sleep, and rest when you feel tired. How to recognize changes  The way that you deal with your grief will affect your ability to function as you normally do. When grieving, you may experience these changes: Numbness, shock, sadness, anxiety, anger, denial, and guilt. Thoughts about death. Unexpected crying. A physical sensation of emptiness in your stomach. Problems sleeping and eating. Tiredness (fatigue). Loss of interest in normal activities. Dreaming about or imagining seeing the person who died. A need to remember what or whom you lost. Difficulty thinking about anything other than your loss for a period of time. Relief. If you have been expecting the loss for a while, you may feel a sense of relief when it happens. Follow these instructions at home:    Activity Express your feelings in healthy ways, such as: Talking with others about your loss. It may be helpful to find others who have had a similar loss, such as a support group. Writing down your feelings in a journal. Doing physical activities to release stress and emotional energy. Doing creative activities like painting, sculpting, or playing or listening to music. Practicing resilience. This is the ability to recover and adjust after facing challenges. Reading some resources that encourage resilience may help you to learn ways to practice those behaviors. General instructions Be patient with yourself and others. Allow the grieving process to happen, and remember that grieving takes time. It is likely that you may never feel completely done with some grief. You may find a way to move on while still cherishing memories and feelings about your loss. Accepting your loss is a process. It can take months or longer to adjust. Keep all follow-up visits as told by your health care provider. This is important. Where to  find support To get support for managing loss: Ask your health care provider for help and recommendations, such as grief counseling or therapy. Think about joining a support group for people who are managing a loss. Where to find more information You can find more information about managing loss from: American Society of Clinical Oncology: www.cancer.net American Psychological Association: [employer].ca Contact a health care provider if: Your grief is extreme and keeps getting worse. You have ongoing grief that does not improve. Your body shows symptoms of grief, such as illness. You feel depressed, anxious, or lonely. Get help right away if: You have thoughts about hurting yourself or others. If you ever feel like you may hurt yourself or others, or have thoughts about taking your own life, get help right away. You can go to your nearest emergency department or call: Your local emergency services (911 in the [place] A suicide crisis helpline, such as the National Suicide Prevention Lifeline at 419-819-3807. This is open 24 hours a day. Summary Grief is the result of a major change or an absence of someone or something that you count on. Grief is a normal reaction to loss. The depth of grief and the period of recovery depend on the type of loss and  your ability to adjust to the change and process your feelings. Processing grief requires patience and a willingness to accept your feelings and talk about your loss with people who are supportive. It is important to find resources that work for you and to realize that people experience grief differently. There is not one grieving process that works for everyone in the same way. Be aware that when grief becomes extreme, it can lead to more severe issues like isolation, depression, anxiety, or suicidal thoughts. Talk with your health care provider if you have any of these issues. This information is not intended to replace advice given to you by your  health care provider. Make sure you discuss any questions you have with your health care provider. Document Revised: 01/30/2019 Document Reviewed: 04/11/2017 Elsevier Patient Education  2020 [employer].  Help for Managing Grief   When you are experiencing grief, many resources and support are available to help you. You are not alone.    Grief Share (https://www.SunglassSpecialist.gl):    [employer] of [address] [place], [place]      [employer]  [phone] [address] [place], [place]     [facility] [address] [place], [place]      [facility] [address] [place], [place]  [phone]   [facility] [address] [place], [place] [phone]   [facility] [address] [place], [place]     [phone]   [facility] [address] [place], [place]     [phone]   If a Hospice or Palliative Care team has been involved in the care of your loved one, you may reach out to your contact there or locally, you may reach out to [facility]:    [facility]   [address] [place], [place] [address] [phone]   [facility] in [place], [place]  [address] [place], [place], [place] [address] Phone: [phone]       Follow up:  Patient agrees to Care Plan and Follow-up.  Plan: The Managed Medicaid care management team will reach out to the patient again over the next 30 days.  Dickie La, BSW, MSW, [employer] Managed Medicaid LCSW [facility]  [facility] [email or web address] Phone: [phone]

## 2023-07-11 NOTE — Patient Instructions (Signed)
Visit Information  Ms. Raitz was given information about Medicaid Managed Care team care coordination services as a part of their Healthy [facility] Medicaid benefit. [name] verbally consented to engagement with the [facility] Managed Care team.   If you are experiencing a medical emergency, please call 911 or report to your local emergency department or urgent care.   If you have a non-emergency medical problem during routine business hours, please contact your provider's office and ask to speak with a nurse.   For questions related to your Healthy [facility] health plan, please call: [phone] or visit the homepage here: [email or web address]  If you would like to schedule transportation through your Healthy [facility] plan, please call the following number at least 2 days in advance of your appointment: [phone]  For information about your ride after you set it up, call Ride Assist at [phone]. Use this number to activate a Will Call pickup, or if your transportation is late for a scheduled pickup. Use this number, too, if you need to make a change or cancel a previously scheduled reservation.  If you need transportation services right away, call [phone]. The after-hours call center is staffed 24 hours to handle ride assistance and urgent reservation requests (including discharges) 365 days a year. Urgent trips include sick visits, hospital discharge requests and life-sustaining treatment.  Call the [facility] Line at [phone], at any time, 24 hours a day, 7 days a week. If you are in danger or need immediate medical attention call 911.  If you would like help to quit smoking, call 1-800-QUIT-NOW ((864) 214-6676) OR Espaol: 1-855-Djelo-Ya (3-329-518-8416) o para ms informacin haga clic aqu or Text READY to 606-301 to register via text  Following is a copy of your plan of care:  Care Plan : LCSW Plan of Care   Updates made by [name], LCSW since 07/11/2023 12:00 AM     Problem: Depression Identification (Depression)      Goal: Grief Symptoms Identified   Note:   Priority: High  Timeframe:  Short-Range Goal Priority:  High Start Date:   06/27/23              Expected End Date:  ongoing                     Follow Up Date--07/25/23 at 230 pm  - check out bereavement counseling and long term counseling options -resources sent to you by email  - keep 90 percent of counseling appointments - schedule initial counseling appointment    Why is this important?             Beating grief and depression may take some time.            If you don't feel better right away, don't give up on your treatment plan.    Current barriers:   Chronic Mental Health needs related to bipolar and grief from losing her sister in May of 2024. Patient requires Support, Education, Resources, Referrals, Advocacy, and Care Coordination, in order to meet Unmet Mental Health Needs. Patient will implement clinical interventions discussed today to decrease symptoms of grief and increase knowledge and/or ability of: coping skills. Mental Health Concerns and Social Isolation Patient lacks knowledge of available community counseling agencies and resources.  Clinical Goal(s): verbalize understanding of plan for management of Grief, Bipolar, Depression, and Stress and demonstrate a reduction in symptoms. Patient will connect with a provider for ongoing mental health treatment, increase coping  skills, healthy habits, self-management skills, and stress reduction       Patient Goals/Self-Care Activities: Over the next 120 days Attend scheduled medical appointments Utilize healthy coping skills and supportive resources discussed Contact PCP with any questions or concerns Keep 90 percent of counseling appointments Call your insurance provider for more information about your Enhanced Benefits  Check out counseling resources  provided  Begin personal counseling with LCSW, to reduce and manage symptoms of Depression and Stress, until well-established with mental health provider Accept all calls from mental health representatives as an effort to establish ongoing mental health counseling and supportive grief services.  Incorporate into daily practice - relaxation techniques, deep breathing exercises, and mindfulness meditation strategies. Talk about feelings with friends, family members, spiritual advisor, etc. Contact LCSW directly [phone]), if you have questions, need assistance, or if additional social work needs are identified between now and our next scheduled telephone outreach call. Call 988 for mental health hotline/crisis line if needed (24/7 available) Try techniques to reduce symptoms of anxiety/negative thinking (deep breathing, distraction, positive self talk, etc)  - develop a personal safety plan - develop a plan to deal with triggers like holidays, anniversaries - exercise at least 2 to 3 times per week - have a plan for how to handle bad days - journal feelings and what helps to feel better or worse - spend time or talk with others at least 2 to 3 times per week - watch for early signs of feeling worse - begin personal counseling - call and visit an old friend - check out volunteer opportunities - join a support group - laugh; watch a funny movie or comedian - learn and use visualization or guided imagery - perform a random act of kindness - practice relaxation or meditation daily - start or continue a personal journal - practice positive thinking and self-talk -continue with compliance of taking medication  -identify current effective and ineffective coping strategies.  -implement positive self-talk in care to increase self-esteem, confidence and feelings of control.  -consider alternative and complementary therapy approaches such as meditation, mindfulness or yoga.  -journaling,  prayer, worship services, meditation or pastoral counseling.  -increase participation in pleasurable group activities such as hobbies, singing, sports or volunteering).  -consider the use of meditative movement therapy such as tai chi, yoga or qigong.  -start a regular daily exercise program based on tolerance, ability and patient choice to support positive thinking and activity     Managing Loss, Adult People experience loss in many different ways throughout their lives. Events such as moving, changing jobs, and losing friends can create a sense of loss. The loss may be as serious as a major health change, divorce, death of a pet, or death of a loved one. All of these types of loss are likely to create a physical and emotional reaction known as grief. Grief is the result of a major change or an absence of something or someone that you count on. Grief is a normal reaction to loss. A variety of factors can affect your grieving experience, including: The nature of your loss. Your relationship to what or whom you lost. Your understanding of grief and how to manage it. Your support system. How to manage lifestyle changes Keep to your normal routine as much as possible. If you have trouble focusing or doing normal activities, it is acceptable to take some time away from your normal routine. Spend time with friends and loved ones. Eat a healthy diet, get plenty  of sleep, and rest when you feel tired. How to recognize changes  The way that you deal with your grief will affect your ability to function as you normally do. When grieving, you may experience these changes: Numbness, shock, sadness, anxiety, anger, denial, and guilt. Thoughts about death. Unexpected crying. A physical sensation of emptiness in your stomach. Problems sleeping and eating. Tiredness (fatigue). Loss of interest in normal activities. Dreaming about or imagining seeing the person who died. A need to remember what or whom you  lost. Difficulty thinking about anything other than your loss for a period of time. Relief. If you have been expecting the loss for a while, you may feel a sense of relief when it happens. Follow these instructions at home:    Activity Express your feelings in healthy ways, such as: Talking with others about your loss. It may be helpful to find others who have had a similar loss, such as a support group. Writing down your feelings in a journal. Doing physical activities to release stress and emotional energy. Doing creative activities like painting, sculpting, or playing or listening to music. Practicing resilience. This is the ability to recover and adjust after facing challenges. Reading some resources that encourage resilience may help you to learn ways to practice those behaviors. General instructions Be patient with yourself and others. Allow the grieving process to happen, and remember that grieving takes time. It is likely that you may never feel completely done with some grief. You may find a way to move on while still cherishing memories and feelings about your loss. Accepting your loss is a process. It can take months or longer to adjust. Keep all follow-up visits as told by your health care provider. This is important. Where to find support To get support for managing loss: Ask your health care provider for help and recommendations, such as grief counseling or therapy. Think about joining a support group for people who are managing a loss. Where to find more information You can find more information about managing loss from: American Society of Clinical Oncology: www.cancer.net American Psychological Association: DiceTournament.ca Contact a health care provider if: Your grief is extreme and keeps getting worse. You have ongoing grief that does not improve. Your body shows symptoms of grief, such as illness. You feel depressed, anxious, or lonely. Get help right away if: You have  thoughts about hurting yourself or others. If you ever feel like you may hurt yourself or others, or have thoughts about taking your own life, get help right away. You can go to your nearest emergency department or call: Your local emergency services (911 in the [place] A suicide crisis helpline, such as the National Suicide Prevention Lifeline at 5873049071. This is open 24 hours a day. Summary Grief is the result of a major change or an absence of someone or something that you count on. Grief is a normal reaction to loss. The depth of grief and the period of recovery depend on the type of loss and your ability to adjust to the change and process your feelings. Processing grief requires patience and a willingness to accept your feelings and talk about your loss with people who are supportive. It is important to find resources that work for you and to realize that people experience grief differently. There is not one grieving process that works for everyone in the same way. Be aware that when grief becomes extreme, it can lead to more severe issues like isolation, depression, anxiety, or  suicidal thoughts. Talk with your health care provider if you have any of these issues. This information is not intended to replace advice given to you by your health care provider. Make sure you discuss any questions you have with your health care provider. Document Revised: 01/30/2019 Document Reviewed: 04/11/2017 Elsevier Patient Education  2020 [employer].  Help for Managing Grief   When you are experiencing grief, many resources and support are available to help you. You are not alone.    Grief Share (https://www.SunglassSpecialist.gl):    [employer] of [address] [place], [place]      [facility]  [phone] [place], [place]     [facility] [address] [place], [place]      [facility] [address] [place], [place]  [phone]   [facility] [address] [place], [place] [phone]   [facility] [address] [place], [place]     [phone]   [facility] [address] [place], [place]     [phone]   If a Hospice or Palliative Care team has been involved in the care of your loved one, you may reach out to your contact there or locally, you may reach out to H[facility] of [facility]:    [facility] [place] [facility]   [address] [place], [place] [address] [phone]   [facility]  [facility] in [place], [place]  [address], [place], [place] [address] Phone: [phone]  10 LITTLE Things To Do When You're Feeling Too Down To Do Anything  Take a shower. Even if you plan to stay in all day long and not see a soul, take a shower. It takes the most effort to hop in to the shower but once you do, you'll feel immediate results. It will wake you up and you'll be feeling much fresher (and cleaner too).  Brush and floss your teeth. Give your teeth a good brushing with a floss finish. It's a small task but it feels so good and you can check 'taking care of your health' off the list of things to do.  Do something small on your list. Most of [place] have some small thing we would like to get done (load of laundry, sew a button, email a friend). Doing one of these things will make you feel like you've accomplished something.  Drink water. Drinking water is easy right? It's also really beneficial for your health so keep a glass beside you all day and take sips often. It gives you energy and prevents you from boredom eating.  Do some floor exercises. The last thing you want to do is exercise but it might be just the thing you need the most. Keep it simple and do exercises that involve sitting or laying on the floor. Even the smallest of exercises release chemicals in the  brain that make you feel good. Yoga stretches or core exercises are going to make you feel good with minimal effort.  Make your bed. Making your bed takes a few minutes but it's productive and you'll feel relieved when it's done. An unmade bed is a huge visual reminder that you're having an unproductive day. Do it and consider it your housework for the day.  Put on some nice clothes. Take the sweatpants off even if you don't plan to go anywhere.  Put on clothes that make you feel good. Take a look in the mirror so your brain recognizes the sweatpants have been replaced with clothes that make you look great. It's an instant confidence booster.  Wash the dishes. A pile of dirty dishes in the sink is a reflection of your mood. It's possible that if you wash up the dishes, your mood will follow suit. It's worth a try.  Cook a real meal. If you have the luxury to have a "do nothing" day, you have time to make a real meal for yourself. Make a meal that you love to eat. The process is good to get you out of the funk and the food will ensure you have more energy for tomorrow.  Write out your thoughts by hand. When you hand write, you stimulate your brain to focus on the moment that you're in so make yourself comfortable and write whatever comes into your mind. Put those thoughts out on paper so they stop spinning around in your head. Those thoughts might be the very thing holding you down.  Dickie La, BSW, MSW, [employer] Managed Medicaid LCSW [facility]  [facility] [email or web address] Phone: [phone]

## 2023-07-18 ENCOUNTER — Other Ambulatory Visit: Payer: Medicaid Other

## 2023-07-18 NOTE — Patient Instructions (Signed)
Visit Information  Ms. Kalama was given information about Medicaid Managed Care team care coordination services as a part of their Healthy [facility] Medicaid benefit. [name] verbally consented to engagement with the [facility] Managed Care team.   If you are experiencing a medical emergency, please call 911 or report to your local emergency department or urgent care.   If you have a non-emergency medical problem during routine business hours, please contact your provider's office and ask to speak with a nurse.   For questions related to your Healthy [facility] health plan, please call: [phone] or visit the homepage here: [email or web address]  If you would like to schedule transportation through your Healthy [facility] plan, please call the following number at least 2 days in advance of your appointment: [phone]  For information about your ride after you set it up, call Ride Assist at [phone]. Use this number to activate a Will Call pickup, or if your transportation is late for a scheduled pickup. Use this number, too, if you need to make a change or cancel a previously scheduled reservation.  If you need transportation services right away, call [phone]. The after-hours call center is staffed 24 hours to handle ride assistance and urgent reservation requests (including discharges) 365 days a year. Urgent trips include sick visits, hospital discharge requests and life-sustaining treatment.  Call the [facility] Line at [phone], at any time, 24 hours a day, 7 days a week. If you are in danger or need immediate medical attention call 911.  If you would like help to quit smoking, call 1-800-QUIT-NOW (4086872011) OR Espaol: 1-855-Djelo-Ya (8-546-270-3500) o para ms informacin haga clic aqu or Text READY to 938-182 to register via text  Ms. Castaneda - following are the goals we discussed in your visit today:   Goals  Addressed   None      Social Worker will follow up on 08/20/23.   Gus Puma, Kenard Gower, [place]  Managed Medicaid Social Worker [phone]   Following is a copy of your plan of care:  Care Plan : LCSW Plan of Care  Updates made by Shaune Leeks since 07/18/2023 12:00 AM     Problem: Depression Identification (Depression)      Goal: Grief Symptoms Identified   Note:   Priority: High  Timeframe:  Short-Range Goal Priority:  High Start Date:   06/27/23              Expected End Date:  ongoing                     Follow Up Date--07/25/23 at 230 pm  - check out bereavement counseling and long term counseling options -resources sent to you by email  - keep 90 percent of counseling appointments - schedule initial counseling appointment    Why is this important?             Beating grief and depression may take some time.            If you don't feel better right away, don't give up on your treatment plan.    Current barriers:   Chronic Mental Health needs related to bipolar and grief from losing her sister in May of 2024. Patient requires Support, Education, Resources, Referrals, Advocacy, and Care Coordination, in order to meet Unmet Mental Health Needs. Patient will implement clinical interventions discussed today to decrease symptoms of grief and increase knowledge and/or ability of: coping skills. Mental Health  Concerns and Social Isolation Patient lacks knowledge of available community counseling agencies and resources.  Clinical Goal(s): verbalize understanding of plan for management of Grief, Bipolar, Depression, and Stress and demonstrate a reduction in symptoms. Patient will connect with a provider for ongoing mental health treatment, increase coping skills, healthy habits, self-management skills, and stress reduction        Clinical Interventions:  Assessed patient's previous and current treatment, coping skills, support system and barriers to care.  Patient recently lost her sister tragically to cancer after two weeks of finding out. Patient lost her mother in 2016. She has an amazing support system of family members. She resides with her father and brother.  Verbalization of feelings encouraged, motivational interviewing employed Emotional support provided, positive coping strategies explored Self care emphasized Patient reports significant worsening grief impacting their ability to function appropriately and carry out daily task. Patient receives strong support from her children as well who recently got her a new shih tzu puppy named buttercup. LCSW provided education on relaxation techniques such as meditation, deep breathing, massage, grounding exercises or yoga that can activate the body's relaxation response and ease symptoms of stress and anxiety. LCSW ask that when pt is struggling with difficult emotions and racing thoughts that they start this relaxation response process. LCSW provided extensive education on healthy coping skills for anxiety. SW used active and reflective listening, validated patient's feelings/concerns, and provided emotional support. Patient will work on implementing appropriate self-care habits into their daily routine such as: staying positive, writing a gratitude list, drinking water, staying active around the house, taking their medications as prescribed, combating negative thoughts or emotions and staying connected with their family and friends. Positive reinforcement provided for this decision to work on this. Patient has issues with sleep. LCSW provided education on healthy sleep hygiene and what that looks like. LCSW encouraged patient to implement a night time routine into their schedule that works best for them and that they are able to maintain. Advised patient to implement deep breathing/grounding/meditation/self-care exercises into their nightly routine to combat racing thoughts at night. LCSW encouraged patient  to wake up at the same time each day, make their sleeping environment comfortable, exercise when able, to limit naps and to not eat or drink anything right before bed.  Motivational Interviewing employed Depression screen reviewed  PHQ2/ PHQ9 completed Mindfulness or Relaxation training provided Active listening / Reflection utilized  Advance Care and HCPOA education provided Emotional Support Provided Problem Solving /Task Center strategies reviewed Provided psychoeducation for mental health needs  Provided brief CBT  Reviewed mental health medications and discussed importance of compliance:  Quality of sleep assessed & Sleep Hygiene techniques promoted  Participation in counseling encouraged  Verbalization of feelings encouraged  Suicidal Ideation/Homicidal Ideation assessed: Patient denies SI/HI  Review resources, discussed options and provided patient information about  Mental Health Resources Inter-disciplinary care team collaboration (see longitudinal plan of care) Patient wishes for grief support/therapy resources to be mailed to her address instead of email. She will review these resources and make a decision on where she wishes to gain services at. [facility] LCSW will assist with this grief support and program enrollment process. [facility] LCSW mailed resources on 06/27/23. Update - Patient successfully received resources that were mailed out to her but reports that she just got them Monday and would like two more weeks to review. She reports also needing food and financial support so an additional referral was made for [facility] BSW. Brief emotional support and coping skill  education provided to patient today.   BSW completed a telephone outreach with patient for food resources. Patient states she does receieve $67 in foodstamps each month, but it is not enough. BSW will mail patient resources of food pantries and information for Healthy Blue extra benefits. Patient receives 940 in SSI each month. No  other resources are needed a this time.  Patient Goals/Self-Care Activities: Over the next 120 days Attend scheduled medical appointments Utilize healthy coping skills and supportive resources discussed Contact PCP with any questions or concerns Keep 90 percent of counseling appointments Call your insurance provider for more information about your Enhanced Benefits  Check out counseling resources provided  Begin personal counseling with LCSW, to reduce and manage symptoms of Depression and Stress, until well-established with mental health provider Accept all calls from mental health representatives as an effort to establish ongoing mental health counseling and supportive grief services.  Incorporate into daily practice - relaxation techniques, deep breathing exercises, and mindfulness meditation strategies. Talk about feelings with friends, family members, spiritual advisor, etc. Contact LCSW directly [phone]), if you have questions, need assistance, or if additional social work needs are identified between now and our next scheduled telephone outreach call. Call 988 for mental health hotline/crisis line if needed (24/7 available) Try techniques to reduce symptoms of anxiety/negative thinking (deep breathing, distraction, positive self talk, etc)  - develop a personal safety plan - develop a plan to deal with triggers like holidays, anniversaries - exercise at least 2 to 3 times per week - have a plan for how to handle bad days - journal feelings and what helps to feel better or worse - spend time or talk with others at least 2 to 3 times per week - watch for early signs of feeling worse - begin personal counseling - call and visit an old friend - check out volunteer opportunities - join a support group - laugh; watch a funny movie or comedian - learn and use visualization or guided imagery - perform a random act of kindness - practice relaxation or meditation daily - start or  continue a personal journal - practice positive thinking and self-talk -continue with compliance of taking medication  -identify current effective and ineffective coping strategies.  -implement positive self-talk in care to increase self-esteem, confidence and feelings of control.  -consider alternative and complementary therapy approaches such as meditation, mindfulness or yoga.  -journaling, prayer, worship services, meditation or pastoral counseling.  -increase participation in pleasurable group activities such as hobbies, singing, sports or volunteering).  -consider the use of meditative movement therapy such as tai chi, yoga or qigong.  -start a regular daily exercise program based on tolerance, ability and patient choice to support positive thinking and activity     Managing Loss, Adult People experience loss in many different ways throughout their lives. Events such as moving, changing jobs, and losing friends can create a sense of loss. The loss may be as serious as a major health change, divorce, death of a pet, or death of a loved one. All of these types of loss are likely to create a physical and emotional reaction known as grief. Grief is the result of a major change or an absence of something or someone that you count on. Grief is a normal reaction to loss. A variety of factors can affect your grieving experience, including: The nature of your loss. Your relationship to what or whom you lost. Your understanding of grief and how to manage it. Your support  system. How to manage lifestyle changes Keep to your normal routine as much as possible. If you have trouble focusing or doing normal activities, it is acceptable to take some time away from your normal routine. Spend time with friends and loved ones. Eat a healthy diet, get plenty of sleep, and rest when you feel tired. How to recognize changes  The way that you deal with your grief will affect your ability to function as you  normally do. When grieving, you may experience these changes: Numbness, shock, sadness, anxiety, anger, denial, and guilt. Thoughts about death. Unexpected crying. A physical sensation of emptiness in your stomach. Problems sleeping and eating. Tiredness (fatigue). Loss of interest in normal activities. Dreaming about or imagining seeing the person who died. A need to remember what or whom you lost. Difficulty thinking about anything other than your loss for a period of time. Relief. If you have been expecting the loss for a while, you may feel a sense of relief when it happens. Follow these instructions at home:    Activity Express your feelings in healthy ways, such as: Talking with others about your loss. It may be helpful to find others who have had a similar loss, such as a support group. Writing down your feelings in a journal. Doing physical activities to release stress and emotional energy. Doing creative activities like painting, sculpting, or playing or listening to music. Practicing resilience. This is the ability to recover and adjust after facing challenges. Reading some resources that encourage resilience may help you to learn ways to practice those behaviors. General instructions Be patient with yourself and others. Allow the grieving process to happen, and remember that grieving takes time. It is likely that you may never feel completely done with some grief. You may find a way to move on while still cherishing memories and feelings about your loss. Accepting your loss is a process. It can take months or longer to adjust. Keep all follow-up visits as told by your health care provider. This is important. Where to find support To get support for managing loss: Ask your health care provider for help and recommendations, such as grief counseling or therapy. Think about joining a support group for people who are managing a loss. Where to find more information You can find  more information about managing loss from: American Society of Clinical Oncology: www.cancer.net American Psychological Association: DiceTournament.ca Contact a health care provider if: Your grief is extreme and keeps getting worse. You have ongoing grief that does not improve. Your body shows symptoms of grief, such as illness. You feel depressed, anxious, or lonely. Get help right away if: You have thoughts about hurting yourself or others. If you ever feel like you may hurt yourself or others, or have thoughts about taking your own life, get help right away. You can go to your nearest emergency department or call: Your local emergency services (911 in the [place] A suicide crisis helpline, such as the National Suicide Prevention Lifeline at 909-513-0436. This is open 24 hours a day. Summary Grief is the result of a major change or an absence of someone or something that you count on. Grief is a normal reaction to loss. The depth of grief and the period of recovery depend on the type of loss and your ability to adjust to the change and process your feelings. Processing grief requires patience and a willingness to accept your feelings and talk about your loss with people who are supportive. It is  important to find resources that work for you and to realize that people experience grief differently. There is not one grieving process that works for everyone in the same way. Be aware that when grief becomes extreme, it can lead to more severe issues like isolation, depression, anxiety, or suicidal thoughts. Talk with your health care provider if you have any of these issues. This information is not intended to replace advice given to you by your health care provider. Make sure you discuss any questions you have with your health care provider. Document Revised: 01/30/2019 Document Reviewed: 04/11/2017 Elsevier Patient Education  2020 [employer].  Help for Managing Grief   When you are experiencing  grief, many resources and support are available to help you. You are not alone.    Grief Share (https://www.SunglassSpecialist.gl):    [employer] of [address] [place], [place]      [employer]  [phone] [place], [place]     [facility] [facility] [address] [place], [place]      [facility] [address] [place], [place]  [phone]   [facility] [address] [place], [place] [phone]   [facility] [address] [place], [place]     [phone]   [facility] [address] [place], [place]     [phone]   If a Hospice or Palliative Care team has been involved in the care of your loved one, you may reach out to your contact there or locally, you may reach out to [facility]:    [facility]   [address] [place], [place] [address] [phone]   [facility]  [facility] in [place], [place]  [address], [place], [place] [address] Phone: [phone]

## 2023-07-18 NOTE — Patient Outreach (Signed)
  Medicaid Managed Care Social Work Note  07/18/2023 Name:  [name] MRN:  [id number] DOB:  1971/07/28  [name] is an 52 y.o. year old female who is a primary patient of Laurie Sheldon, MD.  [facility] Managed Care Coordination team was consulted for assistance with:  Food Insecurity  [name] was given information about Medicaid Managed Care Coordination team services today. [name] Patient agreed to services and verbal consent obtained.  Engaged with patient  for by telephone forinitial visit in response to referral for case management and/or care coordination services.   Assessments/Interventions:  Review of past medical history, allergies, medications, health status, including review of consultants reports, laboratory and other test data, was performed as part of comprehensive evaluation and provision of chronic care management services.  SDOH: (Social Determinant of Health) assessments and interventions performed: SDOH Interventions    Flowsheet Row Patient Outreach Telephone from 07/11/2023 in [place] [facility] Patient Outreach Telephone from 06/27/2023 in [place] [facility]PARTMENT Office Visit from 06/17/2023 in [facility] Office Visit from 08/27/2019 in [facility] Office Visit from 01/30/2018 in [facility] Office Visit from 06/28/2016 in [facility]  SDOH Interventions        Transportation Interventions -- -- Other (Comment)  [rides bus] -- -- --  Depression Interventions/Treatment  -- Counseling  [Pt is interested in gaining grief therapy] -- Medication Currently on Treatment Counseling  [goes to [facility]]  Stress Interventions Offered [employer], Provide Counseling Offered [employer], Provide Counseling -- -- -- --     BSW completed a telephone outreach with patient for food resources. Patient  states she does receieve $67 in foodstamps each month, but it is not enough. BSW will mail patient resources of food pantries and information for Healthy Blue extra benefits. Patient receives 940 in SSI each month. No other resources are needed a this time.  Advanced Directives Status:  Not addressed in this encounter.  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   Vaginal candidiasis 01/03/2023   Elevated LFTs 01/01/2022   STI (sexually transmitted infection) 06/27/2021   Amenorrhea 06/26/2021   Tobacco abuse 01/30/2018   Bipolar disorder (HCC) 06/14/2016   Vitamin D deficiency 02/04/2016   Encounter for screening for cervical cancer 03/17/2015   Healthcare maintenance 03/17/2015   Anxiety disorder 06/04/2014   Asthma in adult 06/04/2014   Hypertension 06/04/2014   Chronic allergic rhinitis 06/04/2014    Conditions to be addressed/monitored per PCP order:   food resources  There are no care plans that you recently modified to display for this patient.   Follow up:  Patient agrees to Care Plan and Follow-up.  Plan: The Managed Medicaid care management team will reach out to the patient again over the next 30 days.  Date/time of next scheduled Social Work care management/care coordination outreach:  08/20/23  Gus Puma, Kenard Gower, [facility]  Managed [facility] Social Worker [phone]

## 2023-07-25 ENCOUNTER — Other Ambulatory Visit: Payer: Medicaid Other | Admitting: Licensed Clinical Social Worker

## 2023-07-25 NOTE — Patient Instructions (Signed)
Visit Information  [name] was given information about Medicaid Managed Care team care coordination services as a part of their Healthy [facility] Medicaid benefit. [name] verbally consented to engagement with the [facility] Managed Care team.   If you are experiencing a medical emergency, please call 911 or report to your local emergency department or urgent care.   If you have a non-emergency medical problem during routine business hours, please contact your provider's office and ask to speak with a nurse.   For questions related to your Healthy [facility] health plan, please call: [phone] or visit the homepage here: [email or web address]  If you would like to schedule transportation through your Healthy [facility] plan, please call the following number at least 2 days in advance of your appointment: [phone]  For information about your ride after you set it up, call Ride Assist at [phone]. Use this number to activate a Will Call pickup, or if your transportation is late for a scheduled pickup. Use this number, too, if you need to make a change or cancel a previously scheduled reservation.  If you need transportation services right away, call [phone]. The after-hours call center is staffed 24 hours to handle ride assistance and urgent reservation requests (including discharges) 365 days a year. Urgent trips include sick visits, hospital discharge requests and life-sustaining treatment.  Call the [facility] Line at [phone], at any time, 24 hours a day, 7 days a week. If you are in danger or need immediate medical attention call 911.  If you would like help to quit smoking, call 1-800-QUIT-NOW (517 069 4723) OR Espaol: 1-855-Djelo-Ya (4-742-595-6387) o para ms informacin haga clic aqu or Text READY to 564-332 to register via text  Following is a copy of your plan of care:  Care Plan : LCSW Plan of Care   Updates made by Laurie Bryant, LCSW since 07/25/2023 12:00 AM     Problem: Depression Identification (Depression)      Goal: Grief Symptoms Identified   Note:   Priority: High  Timeframe:  Short-Range Goal Priority:  High Start Date:   06/27/23              Expected End Date:  ongoing                     Follow Up Date--08/22/23 at 3:15 pm  - check out bereavement counseling and long term counseling options -resources sent to you by email  - keep 90 percent of counseling appointments - schedule initial counseling appointment    Why is this important?             Beating grief and depression may take some time.            If you don't feel better right away, don't give up on your treatment plan.    Current barriers:   Chronic Mental Health needs related to bipolar and grief from losing her sister in May of 2024. Patient requires Support, Education, Resources, Referrals, Advocacy, and Care Coordination, in order to meet Unmet Mental Health Needs. Patient will implement clinical interventions discussed today to decrease symptoms of grief and increase knowledge and/or ability of: coping skills. Mental Health Concerns and Social Isolation Patient lacks knowledge of available community counseling agencies and resources.  Clinical Goal(s): verbalize understanding of plan for management of Grief, Bipolar, Depression, and Stress and demonstrate a reduction in symptoms. Patient will connect with a provider for ongoing mental health treatment, increase coping  skills, healthy habits, self-management skills, and stress reduction        Patient Goals/Self-Care Activities: Over the next 120 days Attend scheduled medical appointments Utilize healthy coping skills and supportive resources discussed Contact PCP with any questions or concerns Keep 90 percent of counseling appointments Call your insurance provider for more information about your Enhanced Benefits  Check out counseling resources  provided  Begin personal counseling with LCSW, to reduce and manage symptoms of Depression and Stress, until well-established with mental health provider Accept all calls from mental health representatives as an effort to establish ongoing mental health counseling and supportive grief services.  Incorporate into daily practice - relaxation techniques, deep breathing exercises, and mindfulness meditation strategies. Talk about feelings with friends, family members, spiritual advisor, etc. Contact LCSW directly [phone]), if you have questions, need assistance, or if additional social work needs are identified between now and our next scheduled telephone outreach call. Call 988 for mental health hotline/crisis line if needed (24/7 available) Try techniques to reduce symptoms of anxiety/negative thinking (deep breathing, distraction, positive self talk, etc)  - develop a personal safety plan - develop a plan to deal with triggers like holidays, anniversaries - exercise at least 2 to 3 times per week - have a plan for how to handle bad days - journal feelings and what helps to feel better or worse - spend time or talk with others at least 2 to 3 times per week - watch for early signs of feeling worse - begin personal counseling - call and visit an old friend - check out volunteer opportunities - join a support group - laugh; watch a funny movie or comedian - learn and use visualization or guided imagery - perform a random act of kindness - practice relaxation or meditation daily - start or continue a personal journal - practice positive thinking and self-talk -continue with compliance of taking medication  -identify current effective and ineffective coping strategies.  -implement positive self-talk in care to increase self-esteem, confidence and feelings of control.  -consider alternative and complementary therapy approaches such as meditation, mindfulness or yoga.  -journaling,  prayer, worship services, meditation or pastoral counseling.  -increase participation in pleasurable group activities such as hobbies, singing, sports or volunteering).  -consider the use of meditative movement therapy such as tai chi, yoga or qigong.  -start a regular daily exercise program based on tolerance, ability and patient choice to support positive thinking and activity     Managing Loss, Adult People experience loss in many different ways throughout their lives. Events such as moving, changing jobs, and losing friends can create a sense of loss. The loss may be as serious as a major health change, divorce, death of a pet, or death of a loved one. All of these types of loss are likely to create a physical and emotional reaction known as grief. Grief is the result of a major change or an absence of something or someone that you count on. Grief is a normal reaction to loss. A variety of factors can affect your grieving experience, including: The nature of your loss. Your relationship to what or whom you lost. Your understanding of grief and how to manage it. Your support system. How to manage lifestyle changes Keep to your normal routine as much as possible. If you have trouble focusing or doing normal activities, it is acceptable to take some time away from your normal routine. Spend time with friends and loved ones. Eat a healthy diet, get  plenty of sleep, and rest when you feel tired. How to recognize changes  The way that you deal with your grief will affect your ability to function as you normally do. When grieving, you may experience these changes: Numbness, shock, sadness, anxiety, anger, denial, and guilt. Thoughts about death. Unexpected crying. A physical sensation of emptiness in your stomach. Problems sleeping and eating. Tiredness (fatigue). Loss of interest in normal activities. Dreaming about or imagining seeing the person who died. A need to remember what or whom you  lost. Difficulty thinking about anything other than your loss for a period of time. Relief. If you have been expecting the loss for a while, you may feel a sense of relief when it happens. Follow these instructions at home:    Activity Express your feelings in healthy ways, such as: Talking with others about your loss. It may be helpful to find others who have had a similar loss, such as a support group. Writing down your feelings in a journal. Doing physical activities to release stress and emotional energy. Doing creative activities like painting, sculpting, or playing or listening to music. Practicing resilience. This is the ability to recover and adjust after facing challenges. Reading some resources that encourage resilience may help you to learn ways to practice those behaviors. General instructions Be patient with yourself and others. Allow the grieving process to happen, and remember that grieving takes time. It is likely that you may never feel completely done with some grief. You may find a way to move on while still cherishing memories and feelings about your loss. Accepting your loss is a process. It can take months or longer to adjust. Keep all follow-up visits as told by your health care provider. This is important. Where to find support To get support for managing loss: Ask your health care provider for help and recommendations, such as grief counseling or therapy. Think about joining a support group for people who are managing a loss. Where to find more information You can find more information about managing loss from: American Society of Clinical Oncology: www.cancer.net American Psychological Association: DiceTournament.ca Contact a health care provider if: Your grief is extreme and keeps getting worse. You have ongoing grief that does not improve. Your body shows symptoms of grief, such as illness. You feel depressed, anxious, or lonely. Get help right away if: You have  thoughts about hurting yourself or others. If you ever feel like you may hurt yourself or others, or have thoughts about taking your own life, get help right away. You can go to your nearest emergency department or call: Your local emergency services (911 in the [place] A suicide crisis helpline, such as the National Suicide Prevention Lifeline at 249-543-8521. This is open 24 hours a day. Summary Grief is the result of a major change or an absence of someone or something that you count on. Grief is a normal reaction to loss. The depth of grief and the period of recovery depend on the type of loss and your ability to adjust to the change and process your feelings. Processing grief requires patience and a willingness to accept your feelings and talk about your loss with people who are supportive. It is important to find resources that work for you and to realize that people experience grief differently. There is not one grieving process that works for everyone in the same way. Be aware that when grief becomes extreme, it can lead to more severe issues like isolation, depression, anxiety,  or suicidal thoughts. Talk with your health care provider if you have any of these issues. This information is not intended to replace advice given to you by your health care provider. Make sure you discuss any questions you have with your health care provider. Document Revised: 01/30/2019 Document Reviewed: 04/11/2017 Elsevier Patient Education  2020 [employer].  Help for Managing Grief   When you are experiencing grief, many resources and support are available to help you. You are not alone.    Grief Share (https://www.SunglassSpecialist.gl):    [employer] of [address] [place], [place]      [facility]  [phone] [address] [place], [place]     [facility] [address] [place], [place]      [facility] [address] [place], [place]  [phone]   [facility] [address] [place], [place] [phone]   [facility] [address] [place], [place]     [phone]   [facility] [address] [place], [place]     [phone]   If a Hospice or Palliative Care team has been involved in the care of your loved one, you may reach out to your contact there or locally, you may reach out to [facility] of [facility]:    [facility] [place] [facility]   [address] [place], [place] [address] [phone]   [facility]  [facility] in [place], [place]  [address], [place], [place] [address] Phone: [phone]  The following coping skill education was provided for stress relief and mental health management: "When your car dies or a deadline looms, how do you respond? Long-term, low-grade or acute stress takes a serious toll on your body and mind, so don't ignore feelings of constant tension. Stress is a natural part of life. However, too much stress can harm our health, especially if it continues every day. This is chronic stress and can put you at risk for heart problems like heart disease and depression. Understand what's happening inside your body and learn simple coping skills to combat the negative impacts of everyday stressors.  Types of Stress There are two types of stress: Emotional - types of emotional stress are relationship problems, pressure at work, financial worries, experiencing discrimination or having a major life change. Physical - Examples of physical stress include being sick having pain, not sleeping well, recovery from an injury or having an alcohol and drug use disorder. Fight or Flight Sudden or ongoing stress activates your nervous system and floods your bloodstream with adrenaline and cortisol, two hormones that raise blood pressure, increase heart rate and spike blood sugar.  These changes pitch your body into a fight or flight response. That enabled our ancestors to outrun saber-toothed tigers, and it's helpful today for situations like dodging a car accident. But most modern chronic stressors, such as finances or a challenging relationship, keep your body in that heightened state, which hurts your health. Effects of Too Much Stress If constantly under stress, most of [place] will eventually start to function less well.  Multiple studies link chronic stress to a higher risk of heart disease, stroke, depression, weight gain, memory loss and even premature death, so it's important to recognize the warning signals. Talk to your doctor about ways to manage stress if you're experiencing any of these symptoms: Prolonged periods of poor sleep.  Regular, severe headaches. Unexplained weight loss or gain. Feelings of isolation, withdrawal or worthlessness. Constant anger and irritability. Loss of interest in activities. Constant worrying or obsessive thinking. Excessive alcohol or drug use. Inability to concentrate.  10 Ways to Cope with Chronic Stress It's key to recognize stressful situations as they occur because it allows you to focus on managing how you react. We all need to know when to close our eyes and take a deep breath when we feel tension rising. Use these tips to prevent or reduce chronic stress. 1. Rebalance Work and Home All work and no play? If you're spending too much time at the office, intentionally put more dates in your calendar to enjoy time for fun, either alone or with others. 2. Get Regular Exercise Moving your body on a regular basis balances the nervous system and increases blood circulation, helping to flush out stress hormones. Even a daily 20-minute walk makes a difference. Any kind of exercise can lower stress and improve your mood ? just pick activities that you enjoy and make it a regular habit. 3. Eat Well and Limit Alcohol and  Stimulants Alcohol, nicotine and caffeine may temporarily relieve stress but have negative health impacts and can make stress worse in the long run. Well-nourished bodies cope better, so start with a good breakfast, add more organic fruits and vegetables for a well-balanced diet, avoid processed foods and sugar, try herbal tea and drink more water. 4. Connect with Supportive People Talking face to face with another person releases hormones that reduce stress. Lean on those good listeners in your life. 5. Carve Out Hobby Time Do you enjoy gardening, reading, listening to music or some other creative pursuit? Engage in activities that bring you pleasure and joy; research shows that reduces stress by almost half and lowers your heart rate, too. 6. Practice Meditation, Stress Reduction or Yoga Relaxation techniques activate a state of restfulness that counterbalances your body's fight-or-flight hormones. Even if this also means a 10-minute break in a long day: listen to music, read, go for a walk in nature, do a hobby, take a bath or spend time with a friend. Also consider doing a mindfulness exercise or try a daily deep breathing or imagery practice. Deep Breathing Slow, calm and deep breathing can help you relax. Try these steps to focus on your breathing and repeat as needed. Find a comfortable position and close your eyes. Exhale and drop your shoulders. Breathe in through your nose; fill your lungs and then your belly. Think of relaxing your body, quieting your mind and becoming calm and peaceful. Breathe out slowly through your nose, relaxing your belly. Think of releasing tension, pain, worries or distress. Repeat steps three and four until you feel relaxed. Imagery This involves using your mind to excite the senses -- sound, vision, smell, taste and feeling. This may help ease your stress. Begin by getting comfortable and then do some slow breathing. Imagine a place you love being at. It could  be somewhere from your childhood, somewhere you vacationed or just a place in your imagination. Feel how it is to be in the place you're imagining. Pay attention to the sounds, air, colors, and who is there with you. This is a place where you feel cared for and loved. All is well. You are safe. Take in all the smells, sounds, tastes and feelings. As you do, feel your body being nourished and healed. Feel the calm that surrounds you. Breathe in all the good.  Breathe out any discomfort or tension. 7. Sleep Enough If you get less than seven to eight hours of sleep, your body won't tolerate stress as well as it could. If stress keeps you up at night, address the cause, and add extra meditation into your day to make up for the lost z's. Try to get seven to nine hours of sleep each night. Make a regular bedtime schedule. Keep your room dark and cool. Try to avoid computers, TV, cell phones and tablets before bed. 8. Bond with Connections You Enjoy Go out for a coffee with a friend, chat with a neighbor, call a family member, visit with a clergy member, or even hang out with your pet. Clinical studies show that spending even a short time with a companion animal can cut anxiety levels almost in half. 9. Take a Vacation Getting away from it all can reset your stress tolerance by increasing your mental and emotional outlook, which makes you a happier, more productive person upon return. Leave your cellphone and laptop at home! 10. See a Counselor, Coach or Therapist If negative thoughts overwhelm your ability to make positive changes, it's time to seek professional help. Make an appointment today--your health and life are worth it."  Dickie La, BSW, MSW, [employer] Managed Medicaid LCSW [facility]  [facility] [email or web address] Phone: [phone]

## 2023-07-25 NOTE — Patient Outreach (Signed)
Medicaid Managed Care Social Work Note  07/25/2023 Name:  [name] MRN:  [id number] DOB:  1971/07/05  [name] is an 52 y.o. year old female who is a primary patient of Morrie Sheldon, MD.  The Medicaid Managed Care Coordination team was consulted for assistance with:  Mental Health Counseling and Resources  Ms. Comly was given information about Medicaid Managed Care Coordination team services today. [name] Patient agreed to services and verbal consent obtained.  Engaged with patient  for by telephone forfollow up visit in response to referral for case management and/or care coordination services.   Assessments/Interventions:  Review of past medical history, allergies, medications, health status, including review of consultants reports, laboratory and other test data, was performed as part of comprehensive evaluation and provision of chronic care management services.  SDOH: (Social Determinant of Health) assessments and interventions performed: SDOH Interventions    Flowsheet Row Patient Outreach Telephone from 07/25/2023 in [place] POPULATION HEALTH DEPARTMENT Patient Outreach Telephone from 07/11/2023 in [place] POPULATION HEALTH DEPARTMENT Patient Outreach Telephone from 06/27/2023 in [place] POPULATION HEALTH DEPARTMENT Office Visit from 06/17/2023 in [facility] [facility] Office Visit from 08/27/2019 in [facility] Office Visit from 01/30/2018 in [facility]  SDOH Interventions        Transportation Interventions -- -- -- Other (Comment)  [rides bus] -- --  Depression Interventions/Treatment  -- -- Counseling  [Pt is interested in gaining grief therapy] -- Medication Currently on Treatment  Stress Interventions Offered [employer], Provide Counseling Offered [employer], Provide Counseling Offered [employer] Resources, Provide Counseling -- -- --        Advanced Directives Status:  See Care Plan for related entries.  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   Vaginal candidiasis 01/03/2023   Elevated LFTs 01/01/2022   STI (sexually transmitted infection) 06/27/2021   Amenorrhea 06/26/2021   Tobacco abuse 01/30/2018   Bipolar disorder (HCC) 06/14/2016   Vitamin D deficiency 02/04/2016   Encounter for screening for cervical cancer 03/17/2015   Healthcare maintenance 03/17/2015   Anxiety disorder 06/04/2014   Asthma in adult 06/04/2014   Hypertension 06/04/2014   Chronic allergic rhinitis 06/04/2014    Conditions to be addressed/monitored per PCP order:  Depression  Care Plan : LCSW Plan of Care  Updates made by [name], LCSW since 07/25/2023 12:00 AM     Problem: Depression Identification (Depression)      Goal: Grief Symptoms Identified   Note:   Priority: High  Timeframe:  Short-Range Goal Priority:  High Start Date:   06/27/23              Expected End Date:  ongoing                     Follow Up Date--08/22/23 at 3:15 pm  - check out bereavement counseling and long term counseling options -resources sent to you by email  - keep 90 percent of counseling appointments - schedule initial counseling appointment    Why is this important?             Beating grief  and depression may take some time.            If you don't feel better right away, don't give up on your treatment plan.    Current barriers:   Chronic Mental Health needs related to bipolar and grief from losing her sister in May of 2024. Patient requires Support, Education, Resources, Referrals, Advocacy, and Care Coordination, in order to meet Unmet Mental Health Needs. Patient will implement clinical interventions discussed today to decrease  symptoms of grief and increase knowledge and/or ability of: coping skills. Mental Health Concerns and Social Isolation Patient lacks knowledge of available community counseling agencies and resources.  Clinical Goal(s): verbalize understanding of plan for management of Grief, Bipolar, Depression, and Stress and demonstrate a reduction in symptoms. Patient will connect with a provider for ongoing mental health treatment, increase coping skills, healthy habits, self-management skills, and stress reduction        Clinical Interventions:  Assessed patient's previous and current treatment, coping skills, support system and barriers to care. Patient recently lost her sister tragically to cancer after two weeks of finding out. Patient lost her mother in 2016. She has an amazing support system of family members. She resides with her father and brother.  Verbalization of feelings encouraged, motivational interviewing employed Emotional support provided, positive coping strategies explored Self care emphasized Patient reports significant worsening grief impacting their ability to function appropriately and carry out daily task. Patient receives strong support from her children as well who recently got her a new shih tzu puppy named buttercup. LCSW provided education on relaxation techniques such as meditation, deep breathing, massage, grounding exercises or yoga that can activate the body's relaxation response and ease symptoms of stress and anxiety. LCSW ask that when pt is struggling with difficult emotions and racing thoughts that they start this relaxation response process. LCSW provided extensive education on healthy coping skills for anxiety. SW used active and reflective listening, validated patient's feelings/concerns, and provided emotional support. Patient will work on implementing appropriate self-care habits into their daily routine such as: staying positive, writing a gratitude list, drinking water,  staying active around the house, taking their medications as prescribed, combating negative thoughts or emotions and staying connected with their family and friends. Positive reinforcement provided for this decision to work on this. Patient has issues with sleep. LCSW provided education on healthy sleep hygiene and what that looks like. LCSW encouraged patient to implement a night time routine into their schedule that works best for them and that they are able to maintain. Advised patient to implement deep breathing/grounding/meditation/self-care exercises into their nightly routine to combat racing thoughts at night. LCSW encouraged patient to wake up at the same time each day, make their sleeping environment comfortable, exercise when able, to limit naps and to not eat or drink anything right before bed.  Motivational Interviewing employed Depression screen reviewed  PHQ2/ PHQ9 completed Mindfulness or Relaxation training provided Active listening / Reflection utilized  Advance Care and HCPOA education provided Emotional Support Provided Problem Solving /Task Center strategies reviewed Provided psychoeducation for mental health needs  Provided brief CBT  Reviewed mental health medications and discussed importance of compliance:  Quality of sleep assessed & Sleep Hygiene techniques promoted  Participation in counseling encouraged  Verbalization of feelings encouraged  Suicidal Ideation/Homicidal Ideation assessed: Patient denies SI/HI  Review resources, discussed options and provided patient information about  Mental Health Resources Inter-disciplinary care team collaboration (see longitudinal plan of care) Patient wishes for grief support/therapy resources  to be mailed to her address instead of email. She will review these resources and make a decision on where she wishes to gain services at. [facility] LCSW will assist with this grief support and program enrollment process. [facility] LCSW mailed resources  on 06/27/23. Update - Patient successfully received resources that were mailed out to her but reports that she just got them Monday and would like two more weeks to review. She reports also needing food and financial support so an additional referral was made for [facility] BSW. Brief emotional support and coping skill education provided to patient today. Update- Patient reports maintaining stability and managing her grief as best as possible with the new addition of her puppy Buttercup. She reports that she does not wish for an additional referral for grief counseling with [facility] as wishes to wait for her initial counseling sessions at [facility] on 09/06/23. [facility] LCSW will continue to support patient until she is established with a long term mental health provider.   BSW completed a telephone outreach with patient for food resources. Patient states she does receieve $67 in foodstamps each month, but it is not enough. BSW will mail patient resources of food pantries and information for [employer] extra benefits. Patient receives 940 in SSI each month. No other resources are needed a this time.  Patient Goals/Self-Care Activities: Over the next 120 days Attend scheduled medical appointments Utilize healthy coping skills and supportive resources discussed Contact PCP with any questions or concerns Keep 90 percent of counseling appointments Call your insurance provider for more information about your Enhanced Benefits  Check out counseling resources provided  Begin personal counseling with LCSW, to reduce and manage symptoms of Depression and Stress, until well-established with mental health provider Accept all calls from mental health representatives as an effort to establish ongoing mental health counseling and supportive grief services.  Incorporate into daily practice - relaxation techniques, deep breathing exercises, and mindfulness meditation strategies. Talk about feelings with friends, family members,  spiritual advisor, etc. Contact LCSW directly [phone]), if you have questions, need assistance, or if additional social work needs are identified between now and our next scheduled telephone outreach call. Call 988 for mental health hotline/crisis line if needed (24/7 available) Try techniques to reduce symptoms of anxiety/negative thinking (deep breathing, distraction, positive self talk, etc)  - develop a personal safety plan - develop a plan to deal with triggers like holidays, anniversaries - exercise at least 2 to 3 times per week - have a plan for how to handle bad days - journal feelings and what helps to feel better or worse - spend time or talk with others at least 2 to 3 times per week - watch for early signs of feeling worse - begin personal counseling - call and visit an old friend - check out volunteer opportunities - join a support group - laugh; watch a funny movie or comedian - learn and use visualization or guided imagery - perform a random act of kindness - practice relaxation or meditation daily - start or continue a personal journal - practice positive thinking and self-talk -continue with compliance of taking medication  -identify current effective and ineffective coping strategies.  -implement positive self-talk in care to increase self-esteem, confidence and feelings of control.  -consider alternative and complementary therapy approaches such as meditation, mindfulness or yoga.  -journaling, prayer, worship services, meditation or pastoral counseling.  -increase participation in pleasurable group activities such as hobbies, singing, sports or volunteering).  -consider the  use of meditative movement therapy such as tai chi, yoga or qigong.  -start a regular daily exercise program based on tolerance, ability and patient choice to support positive thinking and activity     Managing Loss, Adult People experience loss in many different ways throughout their  lives. Events such as moving, changing jobs, and losing friends can create a sense of loss. The loss may be as serious as a major health change, divorce, death of a pet, or death of a loved one. All of these types of loss are likely to create a physical and emotional reaction known as grief. Grief is the result of a major change or an absence of something or someone that you count on. Grief is a normal reaction to loss. A variety of factors can affect your grieving experience, including: The nature of your loss. Your relationship to what or whom you lost. Your understanding of grief and how to manage it. Your support system. How to manage lifestyle changes Keep to your normal routine as much as possible. If you have trouble focusing or doing normal activities, it is acceptable to take some time away from your normal routine. Spend time with friends and loved ones. Eat a healthy diet, get plenty of sleep, and rest when you feel tired. How to recognize changes  The way that you deal with your grief will affect your ability to function as you normally do. When grieving, you may experience these changes: Numbness, shock, sadness, anxiety, anger, denial, and guilt. Thoughts about death. Unexpected crying. A physical sensation of emptiness in your stomach. Problems sleeping and eating. Tiredness (fatigue). Loss of interest in normal activities. Dreaming about or imagining seeing the person who died. A need to remember what or whom you lost. Difficulty thinking about anything other than your loss for a period of time. Relief. If you have been expecting the loss for a while, you may feel a sense of relief when it happens. Follow these instructions at home:    Activity Express your feelings in healthy ways, such as: Talking with others about your loss. It may be helpful to find others who have had a similar loss, such as a support group. Writing down your feelings in a journal. Doing physical  activities to release stress and emotional energy. Doing creative activities like painting, sculpting, or playing or listening to music. Practicing resilience. This is the ability to recover and adjust after facing challenges. Reading some resources that encourage resilience may help you to learn ways to practice those behaviors. General instructions Be patient with yourself and others. Allow the grieving process to happen, and remember that grieving takes time. It is likely that you may never feel completely done with some grief. You may find a way to move on while still cherishing memories and feelings about your loss. Accepting your loss is a process. It can take months or longer to adjust. Keep all follow-up visits as told by your health care provider. This is important. Where to find support To get support for managing loss: Ask your health care provider for help and recommendations, such as grief counseling or therapy. Think about joining a support group for people who are managing a loss. Where to find more information You can find more information about managing loss from: American Society of Clinical Oncology: www.cancer.net American Psychological Association: DiceTournament.ca Contact a health care provider if: Your grief is extreme and keeps getting worse. You have ongoing grief that does not improve. Your body  shows symptoms of grief, such as illness. You feel depressed, anxious, or lonely. Get help right away if: You have thoughts about hurting yourself or others. If you ever feel like you may hurt yourself or others, or have thoughts about taking your own life, get help right away. You can go to your nearest emergency department or call: Your local emergency services (911 in the [place] A suicide crisis helpline, such as the National Suicide Prevention Lifeline at 918-531-2573. This is open 24 hours a day. Summary Grief is the result of a major change or an absence of someone or  something that you count on. Grief is a normal reaction to loss. The depth of grief and the period of recovery depend on the type of loss and your ability to adjust to the change and process your feelings. Processing grief requires patience and a willingness to accept your feelings and talk about your loss with people who are supportive. It is important to find resources that work for you and to realize that people experience grief differently. There is not one grieving process that works for everyone in the same way. Be aware that when grief becomes extreme, it can lead to more severe issues like isolation, depression, anxiety, or suicidal thoughts. Talk with your health care provider if you have any of these issues. This information is not intended to replace advice given to you by your health care provider. Make sure you discuss any questions you have with your health care provider. Document Revised: 01/30/2019 Document Reviewed: 04/11/2017 Elsevier Patient Education  2020 [employer].  Help for Managing Grief   When you are experiencing grief, many resources and support are available to help you. You are not alone.    Grief Share (https://www.SunglassSpecialist.gl):    [employer] of [address] [place], [place]      [employer]  [phone] [place], [place]     [employer] [employer] [facility] [address] [place], [place]      [facility] [address] [place], [place]  [phone]   [facility] [address] [place], [place] [phone]   [facility] [address] [place], [place]     [phone]   [facility] [address] [place], [place]     [phone]   If a Hospice or Palliative Care team has been involved in the care of your loved one, you may reach out to your contact there or locally, you may reach out to [facility]:    Hospice and Palliative  Care of  and [facility]   [address] [place], [place] [address] [phone]   [facility]  [facility] in [place], [place]  [address] [place], [place], [place] [address] Phone: [phone]       Follow up:  Patient agrees to Care Plan and Follow-up.  Plan: The Managed Medicaid care management team will reach out to the patient again over the next 30 days.  Dickie La, BSW, MSW, [employer] Managed Medicaid LCSW [facility]  [facility] [email or web address] Phone: [phone]

## 2023-08-15 ENCOUNTER — Ambulatory Visit: Payer: Medicaid Other | Admitting: Licensed Clinical Social Worker

## 2023-08-20 ENCOUNTER — Other Ambulatory Visit: Payer: Medicaid Other

## 2023-08-20 NOTE — Patient Outreach (Signed)
  Medicaid Managed Care Social Work Note  08/20/2023 Name:  [name] MRN:  [id number] DOB:  07-Oct-1971  [name] is an 52 y.o. year old female who is a primary patient of Morrie Sheldon, MD.  The [facility] Managed Care Coordination team was consulted for assistance with:  Food Insecurity  [name] was given information about Medicaid Managed Care Coordination team services today. [name] Patient agreed to services and verbal consent obtained.  Engaged with patient  for by telephone forfollow up visit in response to referral for case management and/or care coordination services.   Assessments/Interventions:  Review of past medical history, allergies, medications, health status, including review of consultants reports, laboratory and other test data, was performed as part of comprehensive evaluation and provision of chronic care management services.  SDOH: (Social Determinant of Health) assessments and interventions performed: SDOH Interventions    Flowsheet Row Patient Outreach Telephone from 07/25/2023 in [place] POPULATION HEALTH DEPARTMENT Patient Outreach Telephone from 07/11/2023 in [place] POPULATION HEALTH DEPARTMENT Patient Outreach Telephone from 06/27/2023 in [place] POPULATION HEALTH DEPARTMENT Office Visit from 06/17/2023 in [facility] Office Visit from 08/27/2019 in [facility] Office Visit from 01/30/2018 in [facility]  SDOH Interventions        Transportation Interventions -- -- -- Other (Comment)  [rides bus] -- --  Depression Interventions/Treatment  -- -- Counseling  [Pt is interested in gaining grief therapy] -- Medication Currently on Treatment  Stress Interventions Offered [employer], Provide Counseling Offered [employer], Provide Counseling Offered [employer], Provide Counseling -- -- --     BSW completed a telephone  outreach with patient, she states he did give [employer] a call and they informed her they no longer offered the food voucher. Patient states she did receive the food pantries but does not have transportation right now to go due to her daughter leaving for college. BSW contacted [employer] and they no longer offer the food voucher.  Advanced Directives Status:  Not addressed in this encounter.  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   Vaginal candidiasis 01/03/2023   Elevated LFTs 01/01/2022   STI (sexually transmitted infection) 06/27/2021   Amenorrhea 06/26/2021   Tobacco abuse 01/30/2018   Bipolar disorder (HCC) 06/14/2016   Vitamin D deficiency 02/04/2016   Encounter for screening for cervical cancer 03/17/2015   Healthcare maintenance 03/17/2015   Anxiety disorder 06/04/2014   Asthma in adult 06/04/2014   Hypertension 06/04/2014   Chronic allergic rhinitis 06/04/2014    Conditions to be addressed/monitored per PCP order:   food resources  There are no care plans that you recently modified to display for this patient.   Follow up:  Patient agrees to Care Plan and Follow-up.  Plan: The Managed Medicaid care management team will reach out to the patient again over the next 30 days.  Date/time of next scheduled Social Work care management/care coordination outreach:  09/19/23  Gus Puma, Kenard Gower, [facility]  Managed [facility] Social Worker [phone]

## 2023-08-20 NOTE — Patient Instructions (Signed)
Visit Information  Ms. Sudduth was given information about Medicaid Managed Care team care coordination services as a part of their Healthy [facility] Medicaid benefit. [name] verbally consented to engagement with the [facility] Managed Care team.   If you are experiencing a medical emergency, please call 911 or report to your local emergency department or urgent care.   If you have a non-emergency medical problem during routine business hours, please contact your provider's office and ask to speak with a nurse.   For questions related to your Healthy [facility] health plan, please call: [phone] or visit the homepage here: [email or web address]  If you would like to schedule transportation through your Healthy [facility] plan, please call the following number at least 2 days in advance of your appointment: [phone]  For information about your ride after you set it up, call Ride Assist at [phone]. Use this number to activate a Will Call pickup, or if your transportation is late for a scheduled pickup. Use this number, too, if you need to make a change or cancel a previously scheduled reservation.  If you need transportation services right away, call [phone]. The after-hours call center is staffed 24 hours to handle ride assistance and urgent reservation requests (including discharges) 365 days a year. Urgent trips include sick visits, hospital discharge requests and life-sustaining treatment.  Call the Health Pointe Line at [phone], at any time, 24 hours a day, 7 days a week. If you are in danger or need immediate medical attention call 911.  If you would like help to quit smoking, call 1-800-QUIT-NOW (629-128-4819) OR Espaol: 1-855-Djelo-Ya (4-742-595-6387) o para ms informacin haga clic aqu or Text READY to 564-332 to register via text  Ms. Melvin - following are the goals we discussed in your visit today:   Goals  Addressed   None      Social Worker will follow up on 09/19/23.   Gus Puma, Kenard Gower, MHA [facility]  Managed Medicaid Social Worker [phone]   Following is a copy of your plan of care:  There are no care plans that you recently modified to display for this patient.

## 2023-08-22 ENCOUNTER — Other Ambulatory Visit: Payer: Medicaid Other | Admitting: Licensed Clinical Social Worker

## 2023-08-22 NOTE — Patient Outreach (Signed)
Medicaid Managed Care Social Work Note  08/22/2023 Name:  [name] MRN:  [id number] DOB:  March 27, 1971  [name] is an 52 y.o. year old female who is a primary patient of Laurie Sheldon, MD.  The Medicaid Managed Care Coordination team was consulted for assistance with:  Mental Health Counseling and Resources  [name] was given information about Medicaid Managed Care Coordination team services today. [name] Patient agreed to services and verbal consent obtained.  Engaged with patient  for by telephone forfollow up visit in response to referral for case management and/or care coordination services.   Assessments/Interventions:  Review of past medical history, allergies, medications, health status, including review of consultants reports, laboratory and other test data, was performed as part of comprehensive evaluation and provision of chronic care management services.  SDOH: (Social Determinant of Health) assessments and interventions performed: SDOH Interventions    Flowsheet Row Patient Outreach Telephone from 08/22/2023 in [place] POPULATION HEALTH DEPARTMENT Patient Outreach Telephone from 07/25/2023 in [place] POPULATION HEALTH DEPARTMENT Patient Outreach Telephone from 07/11/2023 in [place] POPULATION HEALTH DEPARTMENT Patient Outreach Telephone from 06/27/2023 in [place] POPULATION HEALTH DEPARTMENT Office Visit from [facility]17/2023 [name] Office Visit from 08/27/2019 in [facility]  SDOH Interventions        Transportation Interventions -- -- -- -- Other (Comment)  [rides bus] --  Depression Interventions/Treatment  -- -- -- Counseling  [Pt is interested in gaining grief therapy] -- Medication  Stress Interventions Offered [employer], Provide Counseling Offered [employer] Resources, Provide Counseling Offered [employer] Resources, Provide Counseling Offered [employer] Resources, Provide Counseling -- --       Advanced Directives Status:  See Care Plan for related entries.  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   Vaginal candidiasis 01/03/2023   Elevated LFTs 01/01/2022   STI (sexually transmitted infection) 06/27/2021   Amenorrhea 06/26/2021   Tobacco abuse 01/30/2018   Bipolar disorder (HCC) 06/14/2016   Vitamin D deficiency 02/04/2016   Encounter for screening for cervical cancer 03/17/2015   Healthcare maintenance 03/17/2015   Anxiety disorder 06/04/2014   Asthma in adult 06/04/2014   Hypertension 06/04/2014   Chronic allergic rhinitis 06/04/2014    Conditions to be addressed/monitored per PCP order:   Grief  Care Plan : LCSW Plan of Care  Updates made by Laurie Bryant, LCSW since 08/22/2023 12:00 AM     Problem: Depression Identification (Depression)      Goal: Grief Symptoms Identified   Note:   Priority: High  Timeframe:  Short-Range Goal Priority:  High Start Date:   06/27/23              Expected End Date:  ongoing                     Follow Up Date--09/10/23 at 3:00 pm  - check out bereavement counseling and long term counseling options -resources sent to you by email  - keep 90 percent of counseling appointments - schedule initial counseling appointment    Why is this important?  Beating grief and depression may take some time.            If you don't feel better right away, don't give up on your treatment plan.    Current barriers:   Chronic Mental Health needs related to bipolar and grief from losing her sister in May of 2024. Patient requires Support, Education, Resources, Referrals, Advocacy, and Care Coordination, in order to meet Unmet Mental Health Needs. Patient will implement  clinical interventions discussed today to decrease symptoms of grief and increase knowledge and/or ability of: coping skills. Mental Health Concerns and Social Isolation Patient lacks knowledge of available community counseling agencies and resources.  Clinical Goal(s): verbalize understanding of plan for management of Grief, Bipolar, Depression, and Stress and demonstrate a reduction in symptoms. Patient will connect with a provider for ongoing mental health treatment, increase coping skills, healthy habits, self-management skills, and stress reduction        Clinical Interventions:  Assessed patient's previous and current treatment, coping skills, support system and barriers to care. Patient recently lost her sister tragically to cancer after two weeks of finding out. Patient lost her mother in 2016. She has an amazing support system of family members. She resides with her father and brother.  Verbalization of feelings encouraged, motivational interviewing employed Emotional support provided, positive coping strategies explored Self care emphasized Patient reports significant worsening grief impacting their ability to function appropriately and carry out daily task. Patient receives strong support from her children as well who recently got her a new shih tzu puppy named buttercup. LCSW provided education on relaxation techniques such as meditation, deep breathing, massage, grounding exercises or yoga that can activate the body's relaxation response and ease symptoms of stress and anxiety. LCSW ask that when pt is struggling with difficult emotions and racing thoughts that they start this relaxation response process. LCSW provided extensive education on healthy coping skills for anxiety. SW used active and reflective listening, validated patient's feelings/concerns, and provided emotional support. Patient will work on implementing appropriate self-care habits into their daily routine such as: staying  positive, writing a gratitude list, drinking water, staying active around the house, taking their medications as prescribed, combating negative thoughts or emotions and staying connected with their family and friends. Positive reinforcement provided for this decision to work on this. Patient has issues with sleep. LCSW provided education on healthy sleep hygiene and what that looks like. LCSW encouraged patient to implement a night time routine into their schedule that works best for them and that they are able to maintain. Advised patient to implement deep breathing/grounding/meditation/self-care exercises into their nightly routine to combat racing thoughts at night. LCSW encouraged patient to wake up at the same time each day, make their sleeping environment comfortable, exercise when able, to limit naps and to not eat or drink anything right before bed.  Motivational Interviewing employed Depression screen reviewed  PHQ2/ PHQ9 completed Mindfulness or Relaxation training provided Active listening / Reflection utilized  Advance Care and HCPOA education provided Emotional Support Provided Problem Solving /Task Center strategies reviewed Provided psychoeducation for mental health needs  Provided brief CBT  Reviewed mental health medications and discussed importance of compliance:  Quality of sleep assessed & Sleep Hygiene techniques promoted  Participation in counseling encouraged  Verbalization of feelings encouraged  Suicidal Ideation/Homicidal Ideation assessed: Patient denies SI/HI  Review resources, discussed options and provided patient information about  Mental Health Resources Inter-disciplinary care team collaboration (see longitudinal plan of care) Patient wishes for grief  support/therapy resources to be mailed to her address instead of email. She will review these resources and make a decision on where she wishes to gain services at. [facility] LCSW will assist with this grief support and  program enrollment process. [facility] LCSW mailed resources on 06/27/23. Update - Patient successfully received resources that were mailed out to her but reports that she just got them Monday and would like two more weeks to review. She reports also needing food and financial support so an additional referral was made for [facility] BSW. Brief emotional support and coping skill education provided to patient today. Update- Patient reports maintaining stability and managing her grief as best as possible with the new addition of her puppy Buttercup. She reports that she does not wish for an additional referral for grief counseling with [facility] as wishes to wait for her initial counseling sessions at [facility] on 09/06/23. [facility] LCSW will continue to support patient until she is established with a long term mental health provider. 08/22/23 Update- Patient reports that she is doing well and is looking forward to initial virtual therapy appointment in a few weeks. She shares that she received food support resources from [facility] BSW during their last appointment and they have been helpful. She was provided [name] contact information in case she has issues signing into her upcoming virtual therapy appointment. [facility] LCSW provided brief grief management, self-care and breath work education. [name] LCSW will follow up next month to ensure patient was established successfully with a long term therapist.   Past BSW update-BSW completed a telephone outreach with patient for food resources. Patient states she does receieve $67 in foodstamps each month, but it is not enough. BSW will mail patient resources of food pantries and information for Healthy Blue extra benefits. Patient receives 940 in SSI each month. No other resources are needed a this time.  Patient Goals/Self-Care Activities: Over the next 120 days Attend scheduled medical appointments Utilize healthy coping skills and supportive resources discussed Contact PCP with any questions or  concerns Keep 90 percent of counseling appointments Call your insurance provider for more information about your Enhanced Benefits  Check out counseling resources provided  Begin personal counseling with LCSW, to reduce and manage symptoms of Depression and Stress, until well-established with mental health provider Accept all calls from mental health representatives as an effort to establish ongoing mental health counseling and supportive grief services.  Incorporate into daily practice - relaxation techniques, deep breathing exercises, and mindfulness meditation strategies. Talk about feelings with friends, family members, spiritual advisor, etc. Contact LCSW directly [phone]), if you have questions, need assistance, or if additional social work needs are identified between now and our next scheduled telephone outreach call. Call 988 for mental health hotline/crisis line if needed (24/7 available) Try techniques to reduce symptoms of anxiety/negative thinking (deep breathing, distraction, positive self talk, etc)  - develop a personal safety plan - develop a plan to deal with triggers like holidays, anniversaries - exercise at least 2 to 3 times per week - have a plan for how to handle bad days - journal feelings and what helps to feel better or worse - spend time or talk with others at least 2 to 3 times per week - watch for early signs of feeling worse - begin personal counseling - call and visit an old friend - check out volunteer opportunities - join a support group - laugh; watch a funny movie or comedian - learn and use visualization or guided imagery -  perform a random act of kindness - practice relaxation or meditation daily - start or continue a personal journal - practice positive thinking and self-talk -continue with compliance of taking medication  -identify current effective and ineffective coping strategies.  -implement positive self-talk in care to increase  self-esteem, confidence and feelings of control.  -consider alternative and complementary therapy approaches such as meditation, mindfulness or yoga.  -journaling, prayer, worship services, meditation or pastoral counseling.  -increase participation in pleasurable group activities such as hobbies, singing, sports or volunteering).  -consider the use of meditative movement therapy such as tai chi, yoga or qigong.  -start a regular daily exercise program based on tolerance, ability and patient choice to support positive thinking and activity     Managing Loss, Adult People experience loss in many different ways throughout their lives. Events such as moving, changing jobs, and losing friends can create a sense of loss. The loss may be as serious as a major health change, divorce, death of a pet, or death of a loved one. All of these types of loss are likely to create a physical and emotional reaction known as grief. Grief is the result of a major change or an absence of something or someone that you count on. Grief is a normal reaction to loss. A variety of factors can affect your grieving experience, including: The nature of your loss. Your relationship to what or whom you lost. Your understanding of grief and how to manage it. Your support system. How to manage lifestyle changes Keep to your normal routine as much as possible. If you have trouble focusing or doing normal activities, it is acceptable to take some time away from your normal routine. Spend time with friends and loved ones. Eat a healthy diet, get plenty of sleep, and rest when you feel tired. How to recognize changes  The way that you deal with your grief will affect your ability to function as you normally do. When grieving, you may experience these changes: Numbness, shock, sadness, anxiety, anger, denial, and guilt. Thoughts about death. Unexpected crying. A physical sensation of emptiness in your stomach. Problems sleeping  and eating. Tiredness (fatigue). Loss of interest in normal activities. Dreaming about or imagining seeing the person who died. A need to remember what or whom you lost. Difficulty thinking about anything other than your loss for a period of time. Relief. If you have been expecting the loss for a while, you may feel a sense of relief when it happens. Follow these instructions at home:    Activity Express your feelings in healthy ways, such as: Talking with others about your loss. It may be helpful to find others who have had a similar loss, such as a support group. Writing down your feelings in a journal. Doing physical activities to release stress and emotional energy. Doing creative activities like painting, sculpting, or playing or listening to music. Practicing resilience. This is the ability to recover and adjust after facing challenges. Reading some resources that encourage resilience may help you to learn ways to practice those behaviors. General instructions Be patient with yourself and others. Allow the grieving process to happen, and remember that grieving takes time. It is likely that you may never feel completely done with some grief. You may find a way to move on while still cherishing memories and feelings about your loss. Accepting your loss is a process. It can take months or longer to adjust. Keep all follow-up visits as told by your  health care provider. This is important. Where to find support To get support for managing loss: Ask your health care provider for help and recommendations, such as grief counseling or therapy. Think about joining a support group for people who are managing a loss. Where to find more information You can find more information about managing loss from: American Society of Clinical Oncology: www.cancer.net American Psychological Association: DiceTournament.ca Contact a health care provider if: Your grief is extreme and keeps getting worse. You  have ongoing grief that does not improve. Your body shows symptoms of grief, such as illness. You feel depressed, anxious, or lonely. Get help right away if: You have thoughts about hurting yourself or others. If you ever feel like you may hurt yourself or others, or have thoughts about taking your own life, get help right away. You can go to your nearest emergency department or call: Your local emergency services (911 in the [place] A suicide crisis helpline, such as the National Suicide Prevention Lifeline at (814)127-2660. This is open 24 hours a day. Summary Grief is the result of a major change or an absence of someone or something that you count on. Grief is a normal reaction to loss. The depth of grief and the period of recovery depend on the type of loss and your ability to adjust to the change and process your feelings. Processing grief requires patience and a willingness to accept your feelings and talk about your loss with people who are supportive. It is important to find resources that work for you and to realize that people experience grief differently. There is not one grieving process that works for everyone in the same way. Be aware that when grief becomes extreme, it can lead to more severe issues like isolation, depression, anxiety, or suicidal thoughts. Talk with your health care provider if you have any of these issues. This information is not intended to replace advice given to you by your health care provider. Make sure you discuss any questions you have with your health care provider. Document Revised: 01/30/2019 Document Reviewed: 04/11/2017 Elsevier Patient Education  2020 [employer].  Help for Managing Grief   When you are experiencing grief, many resources and support are available to help you. You are not alone.    Grief Share (https://www.SunglassSpecialist.gl):    [employer] of [address] [place], [place]      [employer]  [phone]  [facility] [address] [place], [place]     [employer] [facility] [address] [place], [place]      [facility] [address] [place], [place]  [phone]   [facility] [address] [place], [place] [phone]   [name] [address] [place], [place]     [phone]   [name] [address] [place], [place]     [phone]   If a Hospice or Palliative Care team has been involved in the care of your loved one, you may reach out to your contact there or locally, you may reach out to [name]:    [facility]   [address] [place], [place] [address] [phone]   [facility]  [name] in [place], [place]  [address] [place], [place], [place] [address] Phone: [phone]       Follow up:  Patient agrees to Care Plan and Follow-up.  Plan: The Managed Medicaid care management team will reach out to the patient again over the next 30 days.  Dickie La, BSW, MSW, [employer] Managed Medicaid LCSW [facility]  [email or web address] Phone: [phone]

## 2023-08-22 NOTE — Patient Instructions (Signed)
Visit Information  [name] was given information about Medicaid Managed Care team care coordination services as a part of their Healthy [facility] Medicaid benefit. [name] verbally consented to engagement with the [facility] Managed Care team.   If you are experiencing a medical emergency, please call 911 or report to your local emergency department or urgent care.   If you have a non-emergency medical problem during routine business hours, please contact your provider's office and ask to speak with a nurse.   For questions related to your Healthy [facility] health plan, please call: [phone] or visit the homepage here: [email or web address]  If you would like to schedule transportation through your Healthy [facility] plan, please call the following number at least 2 days in advance of your appointment: [phone]  For information about your ride after you set it up, call Ride Assist at [phone]. Use this number to activate a Will Call pickup, or if your transportation is late for a scheduled pickup. Use this number, too, if you need to make a change or cancel a previously scheduled reservation.  If you need transportation services right away, call [phone]. The after-hours call center is staffed 24 hours to handle ride assistance and urgent reservation requests (including discharges) 365 days a year. Urgent trips include sick visits, hospital discharge requests and life-sustaining treatment.  Call the [facility] Line at [phone], at any time, 24 hours a day, 7 days a week. If you are in danger or need immediate medical attention call 911.  If you would like help to quit smoking, call 1-800-QUIT-NOW ((475)649-2162) OR Espaol: 1-855-Djelo-Ya (4-742-595-6387) o para ms informacin haga clic aqu or Text READY to 564-332 to register via text  Following is a copy of your plan of care:  Care Plan : LCSW Plan of Care   Updates made by [name], LCSW since 08/22/2023 12:00 AM     Problem: Depression Identification (Depression)      Goal: Grief Symptoms Identified   Note:   Priority: High  Timeframe:  Short-Range Goal Priority:  High Start Date:   06/27/23              Expected End Date:  ongoing                     Follow Up Date--09/10/23 at 3:00 pm  - check out bereavement counseling and long term counseling options -resources sent to you by email  - keep 90 percent of counseling appointments - schedule initial counseling appointment    Why is this important?             Beating grief and depression may take some time.            If you don't feel better right away, don't give up on your treatment plan.    Current barriers:   Chronic Mental Health needs related to bipolar and grief from losing her sister in May of 2024. Patient requires Support, Education, Resources, Referrals, Advocacy, and Care Coordination, in order to meet Unmet Mental Health Needs. Patient will implement clinical interventions discussed today to decrease symptoms of grief and increase knowledge and/or ability of: coping skills. Mental Health Concerns and Social Isolation Patient lacks knowledge of available community counseling agencies and resources.  Clinical Goal(s): verbalize understanding of plan for management of Grief, Bipolar, Depression, and Stress and demonstrate a reduction in symptoms. Patient will connect with a provider for ongoing mental health treatment, increase coping  skills, healthy habits, self-management skills, and stress reduction        Patient Goals/Self-Care Activities: Over the next 120 days Attend scheduled medical appointments Utilize healthy coping skills and supportive resources discussed Contact PCP with any questions or concerns Keep 90 percent of counseling appointments Call your insurance provider for more information about your Enhanced Benefits  Check out counseling resources  provided  Begin personal counseling with LCSW, to reduce and manage symptoms of Depression and Stress, until well-established with mental health provider Accept all calls from mental health representatives as an effort to establish ongoing mental health counseling and supportive grief services.  Incorporate into daily practice - relaxation techniques, deep breathing exercises, and mindfulness meditation strategies. Talk about feelings with friends, family members, spiritual advisor, etc. Contact LCSW directly [phone]), if you have questions, need assistance, or if additional social work needs are identified between now and our next scheduled telephone outreach call. Call 988 for mental health hotline/crisis line if needed (24/7 available) Try techniques to reduce symptoms of anxiety/negative thinking (deep breathing, distraction, positive self talk, etc)  - develop a personal safety plan - develop a plan to deal with triggers like holidays, anniversaries - exercise at least 2 to 3 times per week - have a plan for how to handle bad days - journal feelings and what helps to feel better or worse - spend time or talk with others at least 2 to 3 times per week - watch for early signs of feeling worse - begin personal counseling - call and visit an old friend - check out volunteer opportunities - join a support group - laugh; watch a funny movie or comedian - learn and use visualization or guided imagery - perform a random act of kindness - practice relaxation or meditation daily - start or continue a personal journal - practice positive thinking and self-talk -continue with compliance of taking medication  -identify current effective and ineffective coping strategies.  -implement positive self-talk in care to increase self-esteem, confidence and feelings of control.  -consider alternative and complementary therapy approaches such as meditation, mindfulness or yoga.  -journaling,  prayer, worship services, meditation or pastoral counseling.  -increase participation in pleasurable group activities such as hobbies, singing, sports or volunteering).  -consider the use of meditative movement therapy such as tai chi, yoga or qigong.  -start a regular daily exercise program based on tolerance, ability and patient choice to support positive thinking and activity     Managing Loss, Adult People experience loss in many different ways throughout their lives. Events such as moving, changing jobs, and losing friends can create a sense of loss. The loss may be as serious as a major health change, divorce, death of a pet, or death of a loved one. All of these types of loss are likely to create a physical and emotional reaction known as grief. Grief is the result of a major change or an absence of something or someone that you count on. Grief is a normal reaction to loss. A variety of factors can affect your grieving experience, including: The nature of your loss. Your relationship to what or whom you lost. Your understanding of grief and how to manage it. Your support system. How to manage lifestyle changes Keep to your normal routine as much as possible. If you have trouble focusing or doing normal activities, it is acceptable to take some time away from your normal routine. Spend time with friends and loved ones. Eat a healthy diet, get  plenty of sleep, and rest when you feel tired. How to recognize changes  The way that you deal with your grief will affect your ability to function as you normally do. When grieving, you may experience these changes: Numbness, shock, sadness, anxiety, anger, denial, and guilt. Thoughts about death. Unexpected crying. A physical sensation of emptiness in your stomach. Problems sleeping and eating. Tiredness (fatigue). Loss of interest in normal activities. Dreaming about or imagining seeing the person who died. A need to remember what or whom you  lost. Difficulty thinking about anything other than your loss for a period of time. Relief. If you have been expecting the loss for a while, you may feel a sense of relief when it happens. Follow these instructions at home:    Activity Express your feelings in healthy ways, such as: Talking with others about your loss. It may be helpful to find others who have had a similar loss, such as a support group. Writing down your feelings in a journal. Doing physical activities to release stress and emotional energy. Doing creative activities like painting, sculpting, or playing or listening to music. Practicing resilience. This is the ability to recover and adjust after facing challenges. Reading some resources that encourage resilience may help you to learn ways to practice those behaviors. General instructions Be patient with yourself and others. Allow the grieving process to happen, and remember that grieving takes time. It is likely that you may never feel completely done with some grief. You may find a way to move on while still cherishing memories and feelings about your loss. Accepting your loss is a process. It can take months or longer to adjust. Keep all follow-up visits as told by your health care provider. This is important. Where to find support To get support for managing loss: Ask your health care provider for help and recommendations, such as grief counseling or therapy. Think about joining a support group for people who are managing a loss. Where to find more information You can find more information about managing loss from: American Society of Clinical Oncology: www.cancer.net American Psychological Association: DiceTournament.ca Contact a health care provider if: Your grief is extreme and keeps getting worse. You have ongoing grief that does not improve. Your body shows symptoms of grief, such as illness. You feel depressed, anxious, or lonely. Get help right away if: You have  thoughts about hurting yourself or others. If you ever feel like you may hurt yourself or others, or have thoughts about taking your own life, get help right away. You can go to your nearest emergency department or call: Your local emergency services (911 in the [place] A suicide crisis helpline, such as the National Suicide Prevention Lifeline at 760-850-4419. This is open 24 hours a day. Summary Grief is the result of a major change or an absence of someone or something that you count on. Grief is a normal reaction to loss. The depth of grief and the period of recovery depend on the type of loss and your ability to adjust to the change and process your feelings. Processing grief requires patience and a willingness to accept your feelings and talk about your loss with people who are supportive. It is important to find resources that work for you and to realize that people experience grief differently. There is not one grieving process that works for everyone in the same way. Be aware that when grief becomes extreme, it can lead to more severe issues like isolation, depression, anxiety,  or suicidal thoughts. Talk with your health care provider if you have any of these issues. This information is not intended to replace advice given to you by your health care provider. Make sure you discuss any questions you have with your health care provider. Document Revised: 01/30/2019 Document Reviewed: 04/11/2017 Elsevier Patient Education  2020 [employer].  Help for Managing Grief   When you are experiencing grief, many resources and support are available to help you. You are not alone.    Grief Share (https://www.SunglassSpecialist.gl):    [employer] of [address] [place], [place]      [employer]  [phone] [address] [place], [place]     [facility] [address] [place], [place]      [facility] [address] [place], [place]  [phone]   [facility] [address] [place], [place] [phone]   [facility] [address] [place], [place]     [phone]   [facility] [address] [place], [place]     [phone]   If a Hospice or Palliative Care team has been involved in the care of your loved one, you may reach out to your contact there or locally, you may reach out to [facility]:    [facility]   [address] [place], [place] [address] [phone]   [facility]  [facility] in [place], [place]  [address], [place], [place] [address] Phone: [phone]  Dickie La, BSW, MSW, LCSW Managed Medicaid LCSW [facility]  [facility] [email or web address] Phone: [phone]

## 2023-09-06 ENCOUNTER — Ambulatory Visit (HOSPITAL_COMMUNITY): Payer: Medicaid Other | Admitting: Mental Health

## 2023-09-06 ENCOUNTER — Encounter (HOSPITAL_COMMUNITY): Payer: Self-pay

## 2023-09-10 ENCOUNTER — Other Ambulatory Visit: Payer: Medicaid Other | Admitting: Licensed Clinical Social Worker

## 2023-09-10 NOTE — Patient Outreach (Signed)
Medicaid Managed Care Social Work Note  09/10/2023 Name:  [name] MRN:  [id number] DOB:  10-16-1971  [name] is an 52 y.o. year old female who is a primary patient of Laurie Sheldon, MD.  The Medicaid Managed Care Coordination team was consulted for assistance with:  Mental Health Counseling and Resources  [name] was given information about Medicaid Managed Care Coordination team services today. [name] Patient agreed to services and verbal consent obtained.  Engaged with patient  for by telephone forfollow up visit in response to referral for case management and/or care coordination services.   Assessments/Interventions:  Review of past medical history, allergies, medications, health status, including review of consultants reports, laboratory and other test data, was performed as part of comprehensive evaluation and provision of chronic care management services.  SDOH: (Social Determinant of Health) assessments and interventions performed: SDOH Interventions    Flowsheet Row Patient Outreach Telephone from 09/10/2023 in [place] POPULATION [facility] DEPARTMENT Patient Outreach Telephone from 08/22/2023 in [place] POPULATION HEALTH DEPARTMENT Patient Outreach Telephone from 07/25/2023 in [place] POPULATION HEALTH DEPARTMENT Patient Outreach Telephone from 07/11/2023 in [place] POPULATION HEALTH DEPARTMENT Patient Outreach Telephone from 06/27/2023 in [place] POPULATION HEALTH DEPARTMENT Office Visit from 06/17/2023 in [facility]  SDOH Interventions        Transportation Interventions -- -- -- -- -- Other (Comment)  [rides bus]  Depression Interventions/Treatment  -- -- -- -- Counseling  [Pt is interested in gaining grief therapy] --  Stress Interventions Offered [employer], Provide Counseling Offered [employer] Resources, Provide Counseling Offered [employer] Resources, Provide Counseling Offered  [facility] Resources, Provide Counseling Offered [employer] Resources, Provide Counseling --       Advanced Directives Status:  See Care Plan for related entries.  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   Vaginal candidiasis 01/03/2023   Elevated LFTs 01/01/2022   STI (sexually transmitted infection) 06/27/2021   Amenorrhea 06/26/2021   Tobacco abuse 01/30/2018   Bipolar disorder (HCC) 06/14/2016   Vitamin D deficiency 02/04/2016   Encounter for screening for cervical cancer 03/17/2015   Healthcare maintenance 03/17/2015   Anxiety disorder 06/04/2014   Asthma in adult 06/04/2014   Hypertension 06/04/2014   Chronic allergic rhinitis 06/04/2014    Conditions to be addressed/monitored per PCP order:  Depression  Care Plan : LCSW Plan of Care  Updates made by Laurie Bryant, LCSW since 09/10/2023 12:00 AM     Problem: Depression Identification (Depression)      Goal: Grief Symptoms Identified   Note:   Priority: High  Timeframe:  Short-Range Goal Priority:  High Start Date:   06/27/23              Expected End Date:  ongoing                     Follow Up Date--09/24/23 at 3:30 pm  - check out bereavement counseling and long term counseling options -resources sent to you by email  - keep 90 percent of counseling appointments - schedule initial counseling appointment    Why is this important?  Beating grief and depression may take some time.            If you don't feel better right away, don't give up on your treatment plan.    Current barriers:   Chronic Mental Health needs related to bipolar and grief from losing her sister in May of 2024. Patient requires Support, Education, Resources, Referrals, Advocacy, and Care Coordination, in  order to meet Unmet Mental Health Needs. Patient will implement clinical interventions discussed today to decrease symptoms of grief and increase knowledge and/or ability of: coping skills. Mental Health Concerns and Social Isolation Patient lacks knowledge of available community counseling agencies and resources.  Clinical Goal(s): verbalize understanding of plan for management of Grief, Bipolar, Depression, and Stress and demonstrate a reduction in symptoms. Patient will connect with a provider for ongoing mental health treatment, increase coping skills, healthy habits, self-management skills, and stress reduction        Clinical Interventions:  Assessed patient's previous and current treatment, coping skills, support system and barriers to care. Patient recently lost her sister tragically to cancer after two weeks of finding out. Patient lost her mother in 2016. She has an amazing support system of family members. She resides with her father and brother.  Verbalization of feelings encouraged, motivational interviewing employed Emotional support provided, positive coping strategies explored Self care emphasized Patient reports significant worsening grief impacting their ability to function appropriately and carry out daily task. Patient receives strong support from her children as well who recently got her a new shih tzu puppy named buttercup. LCSW provided education on relaxation techniques such as meditation, deep breathing, massage, grounding exercises or yoga that can activate the body's relaxation response and ease symptoms of stress and anxiety. LCSW ask that when pt is struggling with difficult emotions and racing thoughts that they start this relaxation response process. LCSW provided extensive education on healthy coping skills for anxiety. SW used active and reflective listening, validated patient's feelings/concerns, and provided emotional support. Patient will work on implementing  appropriate self-care habits into their daily routine such as: staying positive, writing a gratitude list, drinking water, staying active around the house, taking their medications as prescribed, combating negative thoughts or emotions and staying connected with their family and friends. Positive reinforcement provided for this decision to work on this. Patient has issues with sleep. LCSW provided education on healthy sleep hygiene and what that looks like. LCSW encouraged patient to implement a night time routine into their schedule that works best for them and that they are able to maintain. Advised patient to implement deep breathing/grounding/meditation/self-care exercises into their nightly routine to combat racing thoughts at night. LCSW encouraged patient to wake up at the same time each day, make their sleeping environment comfortable, exercise when able, to limit naps and to not eat or drink anything right before bed.  Motivational Interviewing employed Depression screen reviewed  PHQ2/ PHQ9 completed Mindfulness or Relaxation training provided Active listening / Reflection utilized  Advance Care and HCPOA education provided Emotional Support Provided Problem Solving /Task Center strategies reviewed Provided psychoeducation for mental health needs  Provided brief CBT  Reviewed mental health medications and discussed importance of compliance:  Quality of sleep assessed & Sleep Hygiene techniques promoted  Participation in counseling encouraged  Verbalization of feelings encouraged  Suicidal Ideation/Homicidal Ideation assessed: Patient denies SI/HI  Review resources, discussed options and provided patient information about  Mental Health Resources Inter-disciplinary care team collaboration (see longitudinal plan of care) Patient wishes for grief  support/therapy resources to be mailed to her address instead of email. She will review these resources and make a decision on where she wishes  to gain services at. [facility] LCSW will assist with this grief support and program enrollment process. [facility] LCSW mailed resources on 06/27/23. Update - Patient successfully received resources that were mailed out to her but reports that she just got them Monday and would like two more weeks to review. She reports also needing food and financial support so an additional referral was made for [facility] BSW. Brief emotional support and coping skill education provided to patient today. Update- Patient reports maintaining stability and managing her grief as best as possible with the new addition of her puppy Buttercup. She reports that she does not wish for an additional referral for grief counseling with [facility] as wishes to wait for her initial counseling sessions at [facility] on 09/06/23. [facility] LCSW will continue to support patient until she is established with a long term mental health provider. 08/22/23 Update- Patient reports that she is doing well and is looking forward to initial virtual therapy appointment in a few weeks. She shares that she received food support resources from [facility] BSW during their last appointment and they have been helpful. She was provided [facility] contact information in case she has issues signing into her upcoming virtual therapy appointment. [facility] LCSW provided brief grief management, self-care and breath work education. [facility] LCSW will follow up next month to ensure patient was established successfully with a long term therapist. Update- Patient reports she was a no show for her therapy appointment this past 09/06/23 because she never received a link to log into the session. [facility] LCSW and patient made joint phone call to [facility] to attempt rescheduling this for an in person meeting but was unsuccessful in reaching anyone. Patient agreed to try again tomorrow and contact information was provided again. [facility] LCSW educated her on their enrollment process at [facility]. Brief self-care education provided as well.    Patient Goals/Self-Care Activities: Over the next 120 days Attend scheduled medical appointments Utilize healthy coping skills and supportive resources discussed Contact PCP with any questions or concerns Keep 90 percent of counseling appointments Call your insurance provider for more information about your Enhanced Benefits  Check out counseling resources provided  Begin personal counseling with LCSW, to reduce and manage symptoms of Depression and Stress, until well-established with mental health provider Accept all calls from mental health representatives as an effort to establish ongoing mental health counseling and supportive grief services.  Incorporate into daily practice - relaxation techniques, deep breathing exercises, and mindfulness meditation strategies. Talk about feelings with friends, family members, spiritual advisor, etc. Contact LCSW directly [phone]), if you have questions, need assistance, or if additional social work needs are identified between now and our next scheduled telephone outreach call. Call [phone] for mental health hotline/crisis line if needed (24/7 available) Try techniques to reduce symptoms of anxiety/negative thinking (deep breathing, distraction, positive self talk, etc)  - develop a personal safety plan - develop a plan to deal with triggers like holidays, anniversaries - exercise at least 2 to 3 times per week - have a plan for how to handle bad days - journal feelings and what helps to feel better or worse - spend time or talk with others at least 2 to 3 times per week - watch for early signs of feeling worse - begin personal counseling - call and visit an old friend - check out volunteer opportunities -  join a support group - laugh; watch a funny movie or comedian - learn and use visualization or guided imagery - perform a random act of kindness - practice relaxation or meditation daily - start or continue a personal journal - practice  positive thinking and self-talk -continue with compliance of taking medication  -identify current effective and ineffective coping strategies.  -implement positive self-talk in care to increase self-esteem, confidence and feelings of control.  -consider alternative and complementary therapy approaches such as meditation, mindfulness or yoga.  -journaling, prayer, worship services, meditation or pastoral counseling.  -increase participation in pleasurable group activities such as hobbies, singing, sports or volunteering).  -consider the use of meditative movement therapy such as tai chi, yoga or qigong.  -start a regular daily exercise program based on tolerance, ability and patient choice to support positive thinking and activity     Managing Loss, Adult People experience loss in many different ways throughout their lives. Events such as moving, changing jobs, and losing friends can create a sense of loss. The loss may be as serious as a major health change, divorce, death of a pet, or death of a loved one. All of these types of loss are likely to create a physical and emotional reaction known as grief. Grief is the result of a major change or an absence of something or someone that you count on. Grief is a normal reaction to loss. A variety of factors can affect your grieving experience, including: The nature of your loss. Your relationship to what or whom you lost. Your understanding of grief and how to manage it. Your support system. How to manage lifestyle changes Keep to your normal routine as much as possible. If you have trouble focusing or doing normal activities, it is acceptable to take some time away from your normal routine. Spend time with friends and loved ones. Eat a healthy diet, get plenty of sleep, and rest when you feel tired. How to recognize changes  The way that you deal with your grief will affect your ability to function as you normally do. When grieving, you may  experience these changes: Numbness, shock, sadness, anxiety, anger, denial, and guilt. Thoughts about death. Unexpected crying. A physical sensation of emptiness in your stomach. Problems sleeping and eating. Tiredness (fatigue). Loss of interest in normal activities. Dreaming about or imagining seeing the person who died. A need to remember what or whom you lost. Difficulty thinking about anything other than your loss for a period of time. Relief. If you have been expecting the loss for a while, you may feel a sense of relief when it happens. Follow these instructions at home:    Activity Express your feelings in healthy ways, such as: Talking with others about your loss. It may be helpful to find others who have had a similar loss, such as a support group. Writing down your feelings in a journal. Doing physical activities to release stress and emotional energy. Doing creative activities like painting, sculpting, or playing or listening to music. Practicing resilience. This is the ability to recover and adjust after facing challenges. Reading some resources that encourage resilience may help you to learn ways to practice those behaviors. General instructions Be patient with yourself and others. Allow the grieving process to happen, and remember that grieving takes time. It is likely that you may never feel completely done with some grief. You may find a way to move on while still cherishing memories and feelings about your loss. Accepting  your loss is a process. It can take months or longer to adjust. Keep all follow-up visits as told by your health care provider. This is important. Where to find support To get support for managing loss: Ask your health care provider for help and recommendations, such as grief counseling or therapy. Think about joining a support group for people who are managing a loss. Where to find more information You can find more information about managing loss  from: American Society of Clinical Oncology: www.cancer.net American Psychological Association: [employer] Contact a health care provider if: Your grief is extreme and keeps getting worse. You have ongoing grief that does not improve. Your body shows symptoms of grief, such as illness. You feel depressed, anxious, or lonely. Get help right away if: You have thoughts about hurting yourself or others. If you ever feel like you may hurt yourself or others, or have thoughts about taking your own life, get help right away. You can go to your nearest emergency department or call: Your local emergency services (911 in the [place] A suicide crisis helpline, such as the National Suicide Prevention Lifeline at (901)804-8683. This is open 24 hours a day. Summary Grief is the result of a major change or an absence of someone or something that you count on. Grief is a normal reaction to loss. The depth of grief and the period of recovery depend on the type of loss and your ability to adjust to the change and process your feelings. Processing grief requires patience and a willingness to accept your feelings and talk about your loss with people who are supportive. It is important to find resources that work for you and to realize that people experience grief differently. There is not one grieving process that works for everyone in the same way. Be aware that when grief becomes extreme, it can lead to more severe issues like isolation, depression, anxiety, or suicidal thoughts. Talk with your health care provider if you have any of these issues. This information is not intended to replace advice given to you by your health care provider. Make sure you discuss any questions you have with your health care provider. Document Revised: 01/30/2019 Document Reviewed: 04/11/2017 Elsevier Patient Education  2020 [employer].  Help for Managing Grief   When you are experiencing grief, many resources and support  are available to help you. You are not alone.    Grief Share (https://www.[email or web address].gl):    [employer] of [address] [place], [place]      [employer]  [phone] [address] [place], [place]     [employer] [address] [place], [place]      [facility] [address] [place], [place]  [phone]   [facility] [address] [place], [place] [phone]   [facility] [address] [place], [place]     [phone]   [facility] [address] [place], [place]     [phone]   If a Hospice or Palliative Care team has been involved in the care of your loved one, you may reach out to your contact there or locally, you may reach out to [facility]:    [facility] [place] [facility]   [address] [place], [place] [address] 336- [phone]   [facility]  [facility] in [place], [place]  [address] [place], [place],  [place] [address] Phone: [phone]       Follow up:  Patient agrees to Care Plan and Follow-up.  Plan: The Managed Medicaid care management team will reach out to the patient again over the next 30 days.  Dickie La, BSW, MSW, [employer] Managed Medicaid LCSW [facility]  [email or web address] Phone: [phone]

## 2023-09-10 NOTE — Patient Instructions (Signed)
Visit Information  [name] was given information about Medicaid Managed Care team care coordination services as a part of their Healthy [facility] Medicaid benefit. [name] verbally consented to engagement with the [facility] Managed Care team.   If you are experiencing a medical emergency, please call 911 or report to your local emergency department or urgent care.   If you have a non-emergency medical problem during routine business hours, please contact your provider's office and ask to speak with a nurse.   For questions related to your Healthy [employer] health plan, please call: [phone] or visit the homepage here: [email or web address]  If you would like to schedule transportation through your Healthy [facility] plan, please call the following number at least 2 days in advance of your appointment: [phone]  For information about your ride after you set it up, call Ride Assist at [phone]. Use this number to activate a Will Call pickup, or if your transportation is late for a scheduled pickup. Use this number, too, if you need to make a change or cancel a previously scheduled reservation.  If you need transportation services right away, call [phone]. The after-hours call center is staffed 24 hours to handle ride assistance and urgent reservation requests (including discharges) 365 days a year. Urgent trips include sick visits, hospital discharge requests and life-sustaining treatment.  Call the [facility] Line at [phone], at any time, 24 hours a day, 7 days a week. If you are in danger or need immediate medical attention call 911.  If you would like help to quit smoking, call 1-800-QUIT-NOW ((506)685-5338) OR Espaol: 1-855-Djelo-Ya (4-742-595-6387) o para ms informacin haga clic aqu or Text READY to 564-332 to register via text  Following is a copy of your plan of care:  Care Plan : LCSW Plan of Care   Updates made by [name], LCSW since 09/10/2023 12:00 AM     Problem: Depression Identification (Depression)      Goal: Grief Symptoms Identified   Note:   Priority: High  Timeframe:  Short-Range Goal Priority:  High Start Date:   06/27/23              Expected End Date:  ongoing                     Follow Up Date--09/24/23 at 3:30 pm  - check out bereavement counseling and long term counseling options -resources sent to you by email  - keep 90 percent of counseling appointments - schedule initial counseling appointment    Why is this important?             Beating grief and depression may take some time.            If you don't feel better right away, don't give up on your treatment plan.    Current barriers:   Chronic Mental Health needs related to bipolar and grief from losing her sister in May of 2024. Patient requires Support, Education, Resources, Referrals, Advocacy, and Care Coordination, in order to meet Unmet Mental Health Needs. Patient will implement clinical interventions discussed today to decrease symptoms of grief and increase knowledge and/or ability of: coping skills. Mental Health Concerns and Social Isolation Patient lacks knowledge of available community counseling agencies and resources.  Clinical Goal(s): verbalize understanding of plan for management of Grief, Bipolar, Depression, and Stress and demonstrate a reduction in symptoms. Patient will connect with a provider for ongoing mental health treatment, increase coping  skills, healthy habits, self-management skills, and stress reduction        Patient Goals/Self-Care Activities: Over the next 120 days Attend scheduled medical appointments Utilize healthy coping skills and supportive resources discussed Contact PCP with any questions or concerns Keep 90 percent of counseling appointments Call your insurance provider for more information about your Enhanced Benefits  Check out counseling resources  provided  Begin personal counseling with LCSW, to reduce and manage symptoms of Depression and Stress, until well-established with mental health provider Accept all calls from mental health representatives as an effort to establish ongoing mental health counseling and supportive grief services.  Incorporate into daily practice - relaxation techniques, deep breathing exercises, and mindfulness meditation strategies. Talk about feelings with friends, family members, spiritual advisor, etc. Contact LCSW directly [phone]), if you have questions, need assistance, or if additional social work needs are identified between now and our next scheduled telephone outreach call. Call 988 for mental health hotline/crisis line if needed (24/7 available) Try techniques to reduce symptoms of anxiety/negative thinking (deep breathing, distraction, positive self talk, etc)  - develop a personal safety plan - develop a plan to deal with triggers like holidays, anniversaries - exercise at least 2 to 3 times per week - have a plan for how to handle bad days - journal feelings and what helps to feel better or worse - spend time or talk with others at least 2 to 3 times per week - watch for early signs of feeling worse - begin personal counseling - call and visit an old friend - check out volunteer opportunities - join a support group - laugh; watch a funny movie or comedian - learn and use visualization or guided imagery - perform a random act of kindness - practice relaxation or meditation daily - start or continue a personal journal - practice positive thinking and self-talk -continue with compliance of taking medication  -identify current effective and ineffective coping strategies.  -implement positive self-talk in care to increase self-esteem, confidence and feelings of control.  -consider alternative and complementary therapy approaches such as meditation, mindfulness or yoga.  -journaling,  prayer, worship services, meditation or pastoral counseling.  -increase participation in pleasurable group activities such as hobbies, singing, sports or volunteering).  -consider the use of meditative movement therapy such as tai chi, yoga or qigong.  -start a regular daily exercise program based on tolerance, ability and patient choice to support positive thinking and activity     Managing Loss, Adult People experience loss in many different ways throughout their lives. Events such as moving, changing jobs, and losing friends can create a sense of loss. The loss may be as serious as a major health change, divorce, death of a pet, or death of a loved one. All of these types of loss are likely to create a physical and emotional reaction known as grief. Grief is the result of a major change or an absence of something or someone that you count on. Grief is a normal reaction to loss. A variety of factors can affect your grieving experience, including: The nature of your loss. Your relationship to what or whom you lost. Your understanding of grief and how to manage it. Your support system. How to manage lifestyle changes Keep to your normal routine as much as possible. If you have trouble focusing or doing normal activities, it is acceptable to take some time away from your normal routine. Spend time with friends and loved ones. Eat a healthy diet, get  plenty of sleep, and rest when you feel tired. How to recognize changes  The way that you deal with your grief will affect your ability to function as you normally do. When grieving, you may experience these changes: Numbness, shock, sadness, anxiety, anger, denial, and guilt. Thoughts about death. Unexpected crying. A physical sensation of emptiness in your stomach. Problems sleeping and eating. Tiredness (fatigue). Loss of interest in normal activities. Dreaming about or imagining seeing the person who died. A need to remember what or whom you  lost. Difficulty thinking about anything other than your loss for a period of time. Relief. If you have been expecting the loss for a while, you may feel a sense of relief when it happens. Follow these instructions at home:    Activity Express your feelings in healthy ways, such as: Talking with others about your loss. It may be helpful to find others who have had a similar loss, such as a support group. Writing down your feelings in a journal. Doing physical activities to release stress and emotional energy. Doing creative activities like painting, sculpting, or playing or listening to music. Practicing resilience. This is the ability to recover and adjust after facing challenges. Reading some resources that encourage resilience may help you to learn ways to practice those behaviors. General instructions Be patient with yourself and others. Allow the grieving process to happen, and remember that grieving takes time. It is likely that you may never feel completely done with some grief. You may find a way to move on while still cherishing memories and feelings about your loss. Accepting your loss is a process. It can take months or longer to adjust. Keep all follow-up visits as told by your health care provider. This is important. Where to find support To get support for managing loss: Ask your health care provider for help and recommendations, such as grief counseling or therapy. Think about joining a support group for people who are managing a loss. Where to find more information You can find more information about managing loss from: American Society of Clinical Oncology: www.cancer.net American Psychological Association: DiceTournament.ca Contact a health care provider if: Your grief is extreme and keeps getting worse. You have ongoing grief that does not improve. Your body shows symptoms of grief, such as illness. You feel depressed, anxious, or lonely. Get help right away if: You have  thoughts about hurting yourself or others. If you ever feel like you may hurt yourself or others, or have thoughts about taking your own life, get help right away. You can go to your nearest emergency department or call: Your local emergency services (911 in the [place] A suicide crisis helpline, such as the National Suicide Prevention Lifeline at 787-243-9450. This is open 24 hours a day. Summary Grief is the result of a major change or an absence of someone or something that you count on. Grief is a normal reaction to loss. The depth of grief and the period of recovery depend on the type of loss and your ability to adjust to the change and process your feelings. Processing grief requires patience and a willingness to accept your feelings and talk about your loss with people who are supportive. It is important to find resources that work for you and to realize that people experience grief differently. There is not one grieving process that works for everyone in the same way. Be aware that when grief becomes extreme, it can lead to more severe issues like isolation, depression, anxiety,  or suicidal thoughts. Talk with your health care provider if you have any of these issues. This information is not intended to replace advice given to you by your health care provider. Make sure you discuss any questions you have with your health care provider. Document Revised: 01/30/2019 Document Reviewed: 04/11/2017 Elsevier Patient Education  2020 [employer].  Help for Managing Grief   When you are experiencing grief, many resources and support are available to help you. You are not alone.    Grief Share (https://www.SunglassSpecialist.gl):    [employer] of [address] [place], [place]      [employer]  [phone] [address] [place], [place]     [employer] [employer] [facility] [address] [place], [place]      [facility] [address] [place], [place]  [phone]   [facility] [address] [place], [place] [phone]   [facility] [address] [place], [place]     [phone]   [facility] [address] [place], [place]     [phone]   If a Hospice or Palliative Care team has been involved in the care of your loved one, you may reach out to your contact there or locally, you may reach out to [facility]:    [facility] [place] and [facility]   [address] [place], [place] [address] [phone]   [facility]  [facility] in [place], [place]  [address], [place], [place] [address] Phone: [phone]  Dickie La, BSW, MSW, LCSW Managed Medicaid LCSW [facility]  [facility] [email or web address] Phone: [phone]

## 2023-09-19 ENCOUNTER — Other Ambulatory Visit: Payer: Medicaid Other

## 2023-09-19 NOTE — Patient Instructions (Signed)
Visit Information  [name] was given information about Medicaid Managed Care team care coordination services as a part of their Healthy [facility] Medicaid benefit. [name] verbally consented to engagement with the [facility] Managed Care team.   If you are experiencing a medical emergency, please call 911 or report to your local emergency department or urgent care.   If you have a non-emergency medical problem during routine business hours, please contact your provider's office and ask to speak with a nurse.   For questions related to your Healthy [facility] health plan, please call: [phone] or visit the homepage here: [email or web address]  If you would like to schedule transportation through your Healthy [facility] plan, please call the following number at least 2 days in advance of your appointment: [phone]  For information about your ride after you set it up, call Ride Assist at [phone]. Use this number to activate a Will Call pickup, or if your transportation is late for a scheduled pickup. Use this number, too, if you need to make a change or cancel a previously scheduled reservation.  If you need transportation services right away, call [phone]. The after-hours call center is staffed 24 hours to handle ride assistance and urgent reservation requests (including discharges) 365 days a year. Urgent trips include sick visits, hospital discharge requests and life-sustaining treatment.  Call the [facility] Line at [phone], at any time, 24 hours a day, 7 days a week. If you are in danger or need immediate medical attention call 911.  If you would like help to quit smoking, call 1-800-QUIT-NOW ((281)502-4951) OR Espaol: 1-855-Djelo-Ya (7-564-332-9518) o para ms informacin haga clic aqu or Text READY to 841-660 to register via text  [name] - following are the goals we discussed in your visit today:   Goals  Addressed   None       Social Worker will follow up on 10/25/23.   Gus Puma, Kenard Gower, MHA [facility]  Managed Medicaid Social Worker [phone]   Following is a copy of your plan of care:  There are no care plans that you recently modified to display for this patient.

## 2023-09-19 NOTE — Patient Outreach (Signed)
  Medicaid Managed Care Social Work Note  09/19/2023 Name:  [name] MRN:  [id number] DOB:  08-07-1971  [name] is an 52 y.o. year old female who is a primary patient of Morrie Sheldon, MD.  The [facility] Managed Care Coordination team was consulted for assistance with:  Food Insecurity  Ms. Falconi was given information about Medicaid Managed Care Coordination team services today. [name] Patient agreed to services and verbal consent obtained.  Engaged with patient  for by telephone forfollow up visit in response to referral for case management and/or care coordination services.   Assessments/Interventions:  Review of past medical history, allergies, medications, health status, including review of consultants reports, laboratory and other test data, was performed as part of comprehensive evaluation and provision of chronic care management services.  SDOH: (Social Determinant of Health) assessments and interventions performed: SDOH Interventions    Flowsheet Row Patient Outreach Telephone from 09/10/2023 in [place] POPULATION HEALTH DEPARTMENT Patient Outreach Telephone from 08/22/2023 in [place] POPULATION HEALTH DEPARTMENT Patient Outreach Telephone from 07/25/2023 in [place] POPULATION HEALTH DEPARTMENT Patient Outreach Telephone from 07/11/2023 in [place] POPULATION HEALTH DEPARTMENT Patient Outreach Telephone from 06/27/2023 in [place] POPULATION HEALTH DEPARTMENT Office Visit from 06/17/2023 in [facility]  SDOH Interventions        Transportation Interventions -- -- -- -- -- Other (Comment)  [rides bus]  Depression Interventions/Treatment  -- -- -- -- Counseling  [Pt is interested in gaining grief therapy] --  Stress Interventions Offered [employer], Provide Counseling Offered [employer] Resources, Provide Counseling Offered [facility] Resources, Provide Counseling Offered [facility]  Resources, Provide Counseling Offered [employer] Resources, Provide Counseling --      BSW completed a telephone outreach with patient she states everything is going well. She states she does have an appointment on 09/20/23 at [facility]. It is closet to her. No other resources are needed at this time. Advanced Directives Status:  Not addressed in this encounter.  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   Vaginal candidiasis 01/03/2023   Elevated LFTs 01/01/2022   STI (sexually transmitted infection) 06/27/2021   Amenorrhea 06/26/2021   Tobacco abuse 01/30/2018   Bipolar disorder (HCC) 06/14/2016   Vitamin D deficiency 02/04/2016   Encounter for screening for cervical cancer 03/17/2015   Healthcare maintenance 03/17/2015   Anxiety disorder 06/04/2014   Asthma in adult 06/04/2014   Hypertension 06/04/2014   Chronic allergic rhinitis 06/04/2014    Conditions to be addressed/monitored per PCP order:   food resources  There are no care plans that you recently modified to display for this patient.   Follow up:  Patient agrees to Care Plan and Follow-up.  Plan: The Managed Medicaid care management team will reach out to the patient again over the next 30-45 days.  Date/time of next scheduled Social Work care management/care coordination outreach:  10/25/23  Gus Puma, Kenard Gower, [facility]  Managed [facility] Social Worker [phone]

## 2023-09-24 ENCOUNTER — Other Ambulatory Visit: Payer: Medicaid Other | Admitting: Licensed Clinical Social Worker

## 2023-09-24 NOTE — Patient Outreach (Signed)
Medicaid Managed Care Social Work Note  09/24/2023 Name:  [name] MRN:  [id number] DOB:  October 01, 1971  [name] is an 52 y.o. year old female who is a primary patient of Laurie Sheldon, MD.  The Medicaid Managed Care Coordination team was consulted for assistance with:  Mental Health Counseling and Resources  [name] was given information about Medicaid Managed Care Coordination team services today. [name] Patient agreed to services and verbal consent obtained.  Engaged with patient  for by telephone forfollow up visit in response to referral for case management and/or care coordination services.   Assessments/Interventions:  Review of past medical history, allergies, medications, health status, including review of consultants reports, laboratory and other test data, was performed as part of comprehensive evaluation and provision of chronic care management services.  SDOH: (Social Determinant of Health) assessments and interventions performed: SDOH Interventions    Flowsheet Row Patient Outreach Telephone from 09/24/2023 in [place] POPULATION HEALTH DEPARTMENT Patient Outreach Telephone from 09/10/2023 in [place] POPULATION HEALTH DEPARTMENT Patient Outreach Telephone from 08/22/2023 in [place] POPULATION HEALTH DEPARTMENT Patient Outreach Telephone from 07/25/2023 in [place] POPULATION HEALTH DEPARTMENT Patient Outreach Telephone from 07/11/2023 in [place] POPULATION HEALTH DEPARTMENT Patient Outreach Telephone from 06/27/2023 in [place] POPULATION HEALTH DEPARTMENT  SDOH Interventions        Depression Interventions/Treatment  -- -- -- -- -- Counseling  [Pt is interested in gaining grief therapy]  Stress Interventions Offered [employer], Provide Counseling Offered [employer] Resources, Provide Counseling Offered [facility] Resources, Provide Counseling Offered Community Wellness Resources, Provide Counseling Offered  Community Wellness Resources, Provide Counseling Offered Community Wellness Resources, Provide Counseling       Advanced Directives Status:  See Care Plan for related entries.  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   Vaginal candidiasis 01/03/2023   Elevated LFTs 01/01/2022   STI (sexually transmitted infection) 06/27/2021   Amenorrhea 06/26/2021   Tobacco abuse 01/30/2018   Bipolar disorder (HCC) 06/14/2016   Vitamin D deficiency 02/04/2016   Encounter for screening for cervical cancer 03/17/2015   Healthcare maintenance 03/17/2015   Anxiety disorder 06/04/2014   Asthma in adult 06/04/2014   Hypertension 06/04/2014   Chronic allergic rhinitis 06/04/2014    Conditions to be addressed/monitored per PCP order:   Grief  Care Plan : LCSW Plan of Care  Updates made by Laurie Bryant, LCSW since 09/24/2023 12:00 AM     Problem: Depression Identification (Depression)      Goal: Grief Symptoms Identified   Note:   Priority: High  Timeframe:  Short-Range Goal Priority:  High Start Date:   06/27/23              Expected End Date:  ongoing                     Follow Up Date--90 day follow up request per pt scheduled for 12/25/23 (patient prefers to start [name] therapy after the holidays)  - check out bereavement counseling and long term counseling options -resources sent to you by email  - keep 90 percent of counseling appointments - schedule initial counseling appointment  Why is this important?             Beating grief and depression may take some time.            If you don't feel better right away, don't give up on your treatment plan.    Current barriers:   Chronic Mental Health needs related to bipolar and grief from losing her sister in May of 2024. Patient  requires Support, Education, Resources, Referrals, Advocacy, and Care Coordination, in order to meet Unmet Mental Health Needs. Patient will implement clinical interventions discussed today to decrease symptoms of grief and increase knowledge and/or ability of: coping skills. Mental Health Concerns and Social Isolation Patient lacks knowledge of available community counseling agencies and resources.  Clinical Goal(s): verbalize understanding of plan for management of Grief, Bipolar, Depression, and Stress and demonstrate a reduction in symptoms. Patient will connect with a provider for ongoing mental health treatment, increase coping skills, healthy habits, self-management skills, and stress reduction        Clinical Interventions:  Assessed patient's previous and current treatment, coping skills, support system and barriers to care. Patient recently lost her sister tragically to cancer after two weeks of finding out. Patient lost her mother in 2016. She has an amazing support system of family members. She resides with her father and brother.  Verbalization of feelings encouraged, motivational interviewing employed Emotional support provided, positive coping strategies explored Self care emphasized Patient reports significant worsening grief impacting their ability to function appropriately and carry out daily task. Patient receives strong support from her children as well who recently got her a new shih tzu puppy named buttercup. LCSW provided education on relaxation techniques such as meditation, deep breathing, massage, grounding exercises or yoga that can activate the body's relaxation response and ease symptoms of stress and anxiety. LCSW ask that when pt is struggling with difficult emotions and racing thoughts that they start this relaxation response process. LCSW provided extensive education on healthy coping skills for anxiety. SW used active and reflective listening, validated patient's  feelings/concerns, and provided emotional support. Patient will work on implementing appropriate self-care habits into their daily routine such as: staying positive, writing a gratitude list, drinking water, staying active around the house, taking their medications as prescribed, combating negative thoughts or emotions and staying connected with their family and friends. Positive reinforcement provided for this decision to work on this. Patient has issues with sleep. LCSW provided education on healthy sleep hygiene and what that looks like. LCSW encouraged patient to implement a night time routine into their schedule that works best for them and that they are able to maintain. Advised patient to implement deep breathing/grounding/meditation/self-care exercises into their nightly routine to combat racing thoughts at night. LCSW encouraged patient to wake up at the same time each day, make their sleeping environment comfortable, exercise when able, to limit naps and to not eat or drink anything right before bed.  Motivational Interviewing employed Depression screen reviewed  PHQ2/ PHQ9 completed Mindfulness or Relaxation training provided Active listening / Reflection utilized  Advance Care and HCPOA education provided Emotional Support Provided Problem Solving /Task Center strategies reviewed Provided psychoeducation for mental health needs  Provided brief CBT  Reviewed mental health medications and discussed importance of compliance:  Quality of sleep assessed & Sleep Hygiene techniques promoted  Participation in counseling encouraged  Verbalization of feelings encouraged  Suicidal Ideation/Homicidal Ideation assessed: Patient denies SI/HI  Review resources, discussed options and provided patient information about  Mental Health Resources Inter-disciplinary care team collaboration (see longitudinal plan of care) Patient wishes for grief support/therapy resources to be mailed to her address  instead of email. She will review these resources and make a decision on where she wishes to gain services at. [facility] LCSW will assist with this grief support and program enrollment process. [facility] LCSW mailed resources on 06/27/23. Update - Patient successfully received resources that were mailed out to her but reports that she just got them Monday and would like two more weeks to review. She reports also needing food and financial support so an additional referral was made for [facility] BSW. Brief emotional support and coping skill education provided to patient today. Update- Patient reports maintaining stability and managing her grief as best as possible with the new addition of her puppy Buttercup. She reports that she does not wish for an additional referral for grief counseling with [facility] as wishes to wait for her initial counseling sessions at [facility] on 09/06/23. [facility] LCSW will continue to support patient until she is established with a long term mental health provider. 08/22/23 Update- Patient reports that she is doing well and is looking forward to initial virtual therapy appointment in a few weeks. She shares that she received food support resources from [facility] B[facility] during their last appointment and they have been helpful. She was provided [name] contact information in case she has issues signing into her upcoming virtual therapy appointment. [facility] LCSW provided brief grief management, self-care and breath work education. [facility] LCSW will follow up next month to ensure patient was established successfully with a long term therapist. Update- Patient reports she was a no show for her therapy appointment this past 09/06/23 because she never received a link to log into the session. [facility] LCSW and patient made joint phone call to [place] [name] to attempt rescheduling this for an in person meeting but was unsuccessful in reaching anyone. Patient agreed to try again tomorrow and contact information was provided again. [facility] LCSW educated her  on their enrollment process at [facility]. Brief self-care education provided as well. Update- Patient reports that she has decided that she does not wish to pursue [facility] treatment until after the holidays. Patient prefers for [facility] LCSW to follow up in 90 days. Brief self-care education provided.   Patient Goals/Self-Care Activities: Over the next 120 days Attend scheduled medical appointments Utilize healthy coping skills and supportive resources discussed Contact PCP with any questions or concerns Keep 90 percent of counseling appointments Call your insurance provider for more information about your Enhanced Benefits  Check out counseling resources provided  Begin personal counseling with LCSW, to reduce and manage symptoms of Depression and Stress, until well-established with mental health provider Accept all calls from mental health representatives as an effort to establish ongoing mental health counseling and supportive grief services.  Incorporate into daily practice - relaxation techniques, deep breathing exercises, and mindfulness meditation strategies. Talk about feelings with friends, family members, spiritual advisor, etc. Contact LCSW directly [phone]), if you have questions, need assistance, or if additional social work needs are identified between now and our next scheduled telephone outreach call. Call [phone] for mental health hotline/crisis line if needed (24/7 available) Try techniques to reduce symptoms of anxiety/negative thinking (deep breathing, distraction, positive self talk, etc)  - develop a personal safety plan - develop a plan to deal with triggers like holidays, anniversaries - exercise at least 2 to 3 times per week - have a plan for how to handle bad  days - journal feelings and what helps to feel better or worse - spend time or talk with others at least 2 to 3 times per week - watch for early signs of feeling worse - begin personal counseling - call and visit an old  friend - check out volunteer opportunities - join a support group - laugh; watch a funny movie or comedian - learn and use visualization or guided imagery - perform a random act of kindness - practice relaxation or meditation daily - start or continue a personal journal - practice positive thinking and self-talk -continue with compliance of taking medication  -identify current effective and ineffective coping strategies.  -implement positive self-talk in care to increase self-esteem, confidence and feelings of control.  -consider alternative and complementary therapy approaches such as meditation, mindfulness or yoga.  -journaling, prayer, worship services, meditation or pastoral counseling.  -increase participation in pleasurable group activities such as hobbies, singing, sports or volunteering).  -consider the use of meditative movement therapy such as tai chi, yoga or qigong.  -start a regular daily exercise program based on tolerance, ability and patient choice to support positive thinking and activity     Managing Loss, Adult People experience loss in many different ways throughout their lives. Events such as moving, changing jobs, and losing friends can create a sense of loss. The loss may be as serious as a major health change, divorce, death of a pet, or death of a loved one. All of these types of loss are likely to create a physical and emotional reaction known as grief. Grief is the result of a major change or an absence of something or someone that you count on. Grief is a normal reaction to loss. A variety of factors can affect your grieving experience, including: The nature of your loss. Your relationship to what or whom you lost. Your understanding of grief and how to manage it. Your support system. How to manage lifestyle changes Keep to your normal routine as much as possible. If you have trouble focusing or doing normal activities, it is acceptable to take some time away  from your normal routine. Spend time with friends and loved ones. Eat a healthy diet, get plenty of sleep, and rest when you feel tired. How to recognize changes  The way that you deal with your grief will affect your ability to function as you normally do. When grieving, you may experience these changes: Numbness, shock, sadness, anxiety, anger, denial, and guilt. Thoughts about death. Unexpected crying. A physical sensation of emptiness in your stomach. Problems sleeping and eating. Tiredness (fatigue). Loss of interest in normal activities. Dreaming about or imagining seeing the person who died. A need to remember what or whom you lost. Difficulty thinking about anything other than your loss for a period of time. Relief. If you have been expecting the loss for a while, you may feel a sense of relief when it happens. Follow these instructions at home:    Activity Express your feelings in healthy ways, such as: Talking with others about your loss. It may be helpful to find others who have had a similar loss, such as a support group. Writing down your feelings in a journal. Doing physical activities to release stress and emotional energy. Doing creative activities like painting, sculpting, or playing or listening to music. Practicing resilience. This is the ability to recover and adjust after facing challenges. Reading some resources that encourage resilience may help you to learn ways to practice those  behaviors. General instructions Be patient with yourself and others. Allow the grieving process to happen, and remember that grieving takes time. It is likely that you may never feel completely done with some grief. You may find a way to move on while still cherishing memories and feelings about your loss. Accepting your loss is a process. It can take months or longer to adjust. Keep all follow-up visits as told by your health care provider. This is important. Where to find support To  get support for managing loss: Ask your health care provider for help and recommendations, such as grief counseling or therapy. Think about joining a support group for people who are managing a loss. Where to find more information You can find more information about managing loss from: American Society of Clinical Oncology: www.cancer.net American Psychological Association: [employer] Contact a health care provider if: Your grief is extreme and keeps getting worse. You have ongoing grief that does not improve. Your body shows symptoms of grief, such as illness. You feel depressed, anxious, or lonely. Get help right away if: You have thoughts about hurting yourself or others. If you ever feel like you may hurt yourself or others, or have thoughts about taking your own life, get help right away. You can go to your nearest emergency department or call: Your local emergency services (911 in the [place] A suicide crisis helpline, such as the National Suicide Prevention Lifeline at (906) 300-5833. This is open 24 hours a day. Summary Grief is the result of a major change or an absence of someone or something that you count on. Grief is a normal reaction to loss. The depth of grief and the period of recovery depend on the type of loss and your ability to adjust to the change and process your feelings. Processing grief requires patience and a willingness to accept your feelings and talk about your loss with people who are supportive. It is important to find resources that work for you and to realize that people experience grief differently. There is not one grieving process that works for everyone in the same way. Be aware that when grief becomes extreme, it can lead to more severe issues like isolation, depression, anxiety, or suicidal thoughts. Talk with your health care provider if you have any of these issues. This information is not intended to replace advice given to you by your health care  provider. Make sure you discuss any questions you have with your health care provider. Document Revised: 01/30/2019 Document Reviewed: 04/11/2017 Elsevier Patient Education  2020 [employer].  Help for Managing Grief   When you are experiencing grief, many resources and support are available to help you. You are not alone.    Grief Share (https://www.[email or web address].gl):    [employer] of [address] [place], [place]      [employer]  [phone] [place], [place]     [facility] [address] [place], [place]      [facility] [address] [place], [place]  [phone]   [name] [address] [place], [place] [phone]   [facility] [address] [place], [place]     [phone]   [name] [address] [place], [place]     [phone]   If a Hospice or Palliative Care team has been involved in the care of your loved one, you may reach out to your contact there  or locally, you may reach out to [facility]:    [facility] [place] [facility] [place] [facility]   [address] [place], [place] [address] [phone]   [facility]  [facility] [place], [place]  [address], [place], [place] [address] Phone: [phone]       Follow up:  Patient agrees to Care Plan and Follow-up.  Plan: The Managed Medicaid care management team will reach out to the patient again over the next 90 days.  Dickie La, BSW, MSW, [employer] Managed Medicaid LCSW [facility]  [facility] [email or web address] Phone: [phone]

## 2023-09-24 NOTE — Patient Instructions (Signed)
Visit Information  [name] was given information about Medicaid Managed Care team care coordination services as a part of their Healthy [facility] Medicaid benefit. [name] verbally consented to engagement with the [facility] Managed Care team.   If you are experiencing a medical emergency, please call 911 or report to your local emergency department or urgent care.   If you have a non-emergency medical problem during routine business hours, please contact your provider's office and ask to speak with a nurse.   For questions related to your Healthy [facility] health plan, please call: [phone] or visit the homepage here: [email or web address]  If you would like to schedule transportation through your Healthy [facility] plan, please call the following number at least 2 days in advance of your appointment: [phone]  For information about your ride after you set it up, call Ride Assist at [phone]. Use this number to activate a Will Call pickup, or if your transportation is late for a scheduled pickup. Use this number, too, if you need to make a change or cancel a previously scheduled reservation.  If you need transportation services right away, call [phone]. The after-hours call center is staffed 24 hours to handle ride assistance and urgent reservation requests (including discharges) 365 days a year. Urgent trips include sick visits, hospital discharge requests and life-sustaining treatment.  Call the [facility] Line at [phone], at any time, 24 hours a day, 7 days a week. If you are in danger or need immediate medical attention call 911.  If you would like help to quit smoking, call 1-800-QUIT-NOW (308-595-7502) OR Espaol: 1-855-Djelo-Ya (5-956-387-5643) o para ms informacin haga clic aqu or Text READY to 329-518 to register via text  Following is a copy of your plan of care:  Care Plan : LCSW Plan of Care   Updates made by [name], LCSW since 09/24/2023 12:00 AM     Problem: Depression Identification (Depression)      Goal: Grief Symptoms Identified   Note:   Priority: High  Timeframe:  Short-Range Goal Priority:  High Start Date:   06/27/23              Expected End Date:  ongoing                     Follow Up Date--90 day follow up request per pt scheduled for 12/25/23 (patient prefers to start [facility] therapy after the holidays)  - check out bereavement counseling and long term counseling options -resources sent to you by email  - keep 90 percent of counseling appointments - schedule initial counseling appointment    Why is this important?             Beating grief and depression may take some time.            If you don't feel better right away, don't give up on your treatment plan.    Current barriers:   Chronic Mental Health needs related to bipolar and grief from losing her sister in May of 2024. Patient requires Support, Education, Resources, Referrals, Advocacy, and Care Coordination, in order to meet Unmet Mental Health Needs. Patient will implement clinical interventions discussed today to decrease symptoms of grief and increase knowledge and/or ability of: coping skills. Mental Health Concerns and Social Isolation Patient lacks knowledge of available community counseling agencies and resources.  Clinical Goal(s): verbalize understanding of plan for management of Grief, Bipolar, Depression, and Stress and demonstrate a reduction  in symptoms. Patient will connect with a provider for ongoing mental health treatment, increase coping skills, healthy habits, self-management skills, and stress reduction      Patient Goals/Self-Care Activities: Over the next 120 days Attend scheduled medical appointments Utilize healthy coping skills and supportive resources discussed Contact PCP with any questions or concerns Keep 90 percent of counseling appointments Call your insurance  provider for more information about your Enhanced Benefits  Check out counseling resources provided  Begin personal counseling with LCSW, to reduce and manage symptoms of Depression and Stress, until well-established with mental health provider Accept all calls from mental health representatives as an effort to establish ongoing mental health counseling and supportive grief services.  Incorporate into daily practice - relaxation techniques, deep breathing exercises, and mindfulness meditation strategies. Talk about feelings with friends, family members, spiritual advisor, etc. Contact LCSW directly [phone]), if you have questions, need assistance, or if additional social work needs are identified between now and our next scheduled telephone outreach call. Call 988 for mental health hotline/crisis line if needed (24/7 available) Try techniques to reduce symptoms of anxiety/negative thinking (deep breathing, distraction, positive self talk, etc)  - develop a personal safety plan - develop a plan to deal with triggers like holidays, anniversaries - exercise at least 2 to 3 times per week - have a plan for how to handle bad days - journal feelings and what helps to feel better or worse - spend time or talk with others at least 2 to 3 times per week - watch for early signs of feeling worse - begin personal counseling - call and visit an old friend - check out volunteer opportunities - join a support group - laugh; watch a funny movie or comedian - learn and use visualization or guided imagery - perform a random act of kindness - practice relaxation or meditation daily - start or continue a personal journal - practice positive thinking and self-talk -continue with compliance of taking medication  -identify current effective and ineffective coping strategies.  -implement positive self-talk in care to increase self-esteem, confidence and feelings of control.  -consider alternative and  complementary therapy approaches such as meditation, mindfulness or yoga.  -journaling, prayer, worship services, meditation or pastoral counseling.  -increase participation in pleasurable group activities such as hobbies, singing, sports or volunteering).  -consider the use of meditative movement therapy such as tai chi, yoga or qigong.  -start a regular daily exercise program based on tolerance, ability and patient choice to support positive thinking and activity     Managing Loss, Adult People experience loss in many different ways throughout their lives. Events such as moving, changing jobs, and losing friends can create a sense of loss. The loss may be as serious as a major health change, divorce, death of a pet, or death of a loved one. All of these types of loss are likely to create a physical and emotional reaction known as grief. Grief is the result of a major change or an absence of something or someone that you count on. Grief is a normal reaction to loss. A variety of factors can affect your grieving experience, including: The nature of your loss. Your relationship to what or whom you lost. Your understanding of grief and how to manage it. Your support system. How to manage lifestyle changes Keep to your normal routine as much as possible. If you have trouble focusing or doing normal activities, it is acceptable to take some time away from your normal  routine. Spend time with friends and loved ones. Eat a healthy diet, get plenty of sleep, and rest when you feel tired. How to recognize changes  The way that you deal with your grief will affect your ability to function as you normally do. When grieving, you may experience these changes: Numbness, shock, sadness, anxiety, anger, denial, and guilt. Thoughts about death. Unexpected crying. A physical sensation of emptiness in your stomach. Problems sleeping and eating. Tiredness (fatigue). Loss of interest in normal  activities. Dreaming about or imagining seeing the person who died. A need to remember what or whom you lost. Difficulty thinking about anything other than your loss for a period of time. Relief. If you have been expecting the loss for a while, you may feel a sense of relief when it happens. Follow these instructions at home:    Activity Express your feelings in healthy ways, such as: Talking with others about your loss. It may be helpful to find others who have had a similar loss, such as a support group. Writing down your feelings in a journal. Doing physical activities to release stress and emotional energy. Doing creative activities like painting, sculpting, or playing or listening to music. Practicing resilience. This is the ability to recover and adjust after facing challenges. Reading some resources that encourage resilience may help you to learn ways to practice those behaviors. General instructions Be patient with yourself and others. Allow the grieving process to happen, and remember that grieving takes time. It is likely that you may never feel completely done with some grief. You may find a way to move on while still cherishing memories and feelings about your loss. Accepting your loss is a process. It can take months or longer to adjust. Keep all follow-up visits as told by your health care provider. This is important. Where to find support To get support for managing loss: Ask your health care provider for help and recommendations, such as grief counseling or therapy. Think about joining a support group for people who are managing a loss. Where to find more information You can find more information about managing loss from: American Society of Clinical Oncology: www.cancer.net American Psychological Association: DiceTournament.ca Contact a health care provider if: Your grief is extreme and keeps getting worse. You have ongoing grief that does not improve. Your body shows  symptoms of grief, such as illness. You feel depressed, anxious, or lonely. Get help right away if: You have thoughts about hurting yourself or others. If you ever feel like you may hurt yourself or others, or have thoughts about taking your own life, get help right away. You can go to your nearest emergency department or call: Your local emergency services (911 in the [place] A suicide crisis helpline, such as the National Suicide Prevention Lifeline at (787) 548-2742. This is open 24 hours a day. Summary Grief is the result of a major change or an absence of someone or something that you count on. Grief is a normal reaction to loss. The depth of grief and the period of recovery depend on the type of loss and your ability to adjust to the change and process your feelings. Processing grief requires patience and a willingness to accept your feelings and talk about your loss with people who are supportive. It is important to find resources that work for you and to realize that people experience grief differently. There is not one grieving process that works for everyone in the same way. Be aware that when grief  becomes extreme, it can lead to more severe issues like isolation, depression, anxiety, or suicidal thoughts. Talk with your health care provider if you have any of these issues. This information is not intended to replace advice given to you by your health care provider. Make sure you discuss any questions you have with your health care provider. Document Revised: 01/30/2019 Document Reviewed: 04/11/2017 Elsevier Patient Education  2020 [employer].  Help for Managing Grief   When you are experiencing grief, many resources and support are available to help you. You are not alone.    Grief Share (https://www.SunglassSpecialist.gl):    [employer] of [address] [place], [place]      [facility]V  [phone] [address] [place], [place]     [facility] [address] [place], [place]      [facility] [address] [place], [place]  [phone]   [facility] [address] [place], [place] [phone]   [facility] [address] [place], [place]     [phone]   [facility] [address] [place], [place]     [phone]   If a Hospice or Palliative Care team has been involved in the care of your loved one, you may reach out to your contact there or locally, you may reach out to Hosp[facility] of [facility]:    [facility]ospice and Palliative Care of [place] [facility]   [address] [place], [place] [address] [phone]   [facility]  [facility] in [place], [place]  [address], [place], [place] [address] Phone: [phone]     24- Hour Availability:    [facility]  [address] [place], [place] [phone] Crisis [phone]   [facility] of the [facility] [phone]  [place] [facility]  [phone]    [facility]  [phone] (after hours)   Therapeutic Alternative/Mobile Crisis   [phone]   [place] National Suicide Hotline  870 252 2331 [address]) [place] 017   Call 39 for mental health emergencies   [facility]  [phone]);  [place] and [employer]  [phone]); [place], [place], [place], [place], [place], [place], [place]    [facility] [facility] for [facility] Residents For 24/7 walk-up access to mental health services for [facility] children (4+), adolescents and adults, please visit the [facility] located at [address] in [place], [place].  *[facility] also provides comprehensive outpatient behavioral health services in a variety of locations around the [place].  Connect With [place] [address] [place],  [place] [address] HelpLine: (907)243-8315 or 1-(413)847-3695  Get Directions  Find Help 24/7 By Phone Call our 24-hour HelpLine at 3255059179 or 863-154-2525 for immediate assistance for mental health and substance abuse issues.  Walk-In Help [place]: [facility] (Ages 4 and Up) [place]: Emergency Dept., [facility] Additional Resources National Hopeline Network: 1-800-SUICIDE The National Suicide Prevention Lifeline: 1-800-273-TALK     Dickie La, BSW, MSW, LCSW Managed Medicaid LCSW [facility]  [facility] [email or web address] Phone: [phone]

## 2023-10-25 ENCOUNTER — Ambulatory Visit: Payer: Medicaid Other

## 2023-10-28 ENCOUNTER — Other Ambulatory Visit: Payer: Self-pay

## 2023-10-28 NOTE — Patient Instructions (Signed)
Visit Information  Ms. [name] was given information about Medicaid Managed Care team care coordination services as a part of their Healthy [facility] Medicaid benefit. [name] verbally consented to engagement with the [facility] Managed Care team.   If you are experiencing a medical emergency, please call 911 or report to your local emergency department or urgent care.   If you have a non-emergency medical problem during routine business hours, please contact your provider's office and ask to speak with a nurse.   For questions related to your Healthy [facility] health plan, please call: [phone] or visit the homepage here: [email or web address]  If you would like to schedule transportation through your Healthy [facility] plan, please call the following number at least 2 days in advance of your appointment: [phone]  For information about your ride after you set it up, call Ride Assist at [phone]. Use this number to activate a Will Call pickup, or if your transportation is late for a scheduled pickup. Use this number, too, if you need to make a change or cancel a previously scheduled reservation.  If you need transportation services right away, call [phone]. The after-hours call center is staffed 24 hours to handle ride assistance and urgent reservation requests (including discharges) 365 days a year. Urgent trips include sick visits, hospital discharge requests and life-sustaining treatment.  Call the [facility] Line at [phone], at any time, 24 hours a day, 7 days a week. If you are in danger or need immediate medical attention call 911.  If you would like help to quit smoking, call 1-800-QUIT-NOW (220-548-8076) OR Espaol: 1-855-Djelo-Ya (2-694-854-6270) o para ms informacin haga clic aqu or Text READY to 350-093 to register via text  [name] - following are the goals we discussed in your visit today:   Goals  Addressed   None      The  Patient                                              has been provided with contact information for the Managed Medicaid care management team and has been advised to call with any health related questions or concerns.   [name], [name], MHA [facility]  Managed Medicaid Social Worker [phone]   Following is a copy of your plan of care:  There are no care plans that you recently modified to display for this patient.

## 2023-10-28 NOTE — Patient Outreach (Signed)
  Medicaid Managed Care Social Work Note  10/28/2023 Name:  [name] MRN:  [id number] DOB:  [birth year]  [name] is an 52 y.o. year old female who is a primary patient of Laurie Sheldon, MD.  The [facility] Managed Care Coordination team was consulted for assistance with:  Food Insecurity  [name] was given information about Medicaid Managed Care Coordination team services today. [name] Patient agreed to services and verbal consent obtained.  Engaged with patient  for by telephone forfollow up visit in response to referral for case management and/or care coordination services.   Assessments/Interventions:  Review of past medical history, allergies, medications, health status, including review of consultants reports, laboratory and other test data, was performed as part of comprehensive evaluation and provision of chronic care management services.  SDOH: (Social Determinant of Health) assessments and interventions performed: SDOH Interventions    Flowsheet Row Patient Outreach Telephone from 09/24/2023 in [place] POPULATION HEALTH DEPARTMENT Patient Outreach Telephone from 09/10/2023 in [place] POPULATION HEALTH DEPARTMENT Patient Outreach Telephone from 08/22/2023 in [place] POPULATION HEALTH DEPARTMENT Patient Outreach Telephone from 07/25/2023 in [place] POPULATION HEALTH DEPARTMENT Patient Outreach Telephone from 07/11/2023 in [place] POPULATION HEALTH DEPARTMENT Patient Outreach Telephone from 06/27/2023 in [place] POPULATION HEALTH DEPARTMENT  SDOH Interventions        Depression Interventions/Treatment  -- -- -- -- -- Counseling  [Pt is interested in gaining grief therapy]  Stress Interventions Offered [employer], Provide Counseling Offered [employer] Resources, Provide Counseling Offered [facility] Wellness Resources, Provide Counseling Offered Community Wellness Resources, Provide Counseling Offered Community Wellness  Resources, Provide Counseling Offered Community Wellness Resources, Provide Counseling      BSW completed a telephone outreach with patient, she states she was able to go to her appointment for the food pantry last month and has another appointment for the food pantry coming up. Patient states this will be the first thanksgiving without her sister she is trying to gather food. Patient states no other resources are needed at this time. Advanced Directives Status:  Not addressed in this encounter.  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   Follow up:  Patient agrees to Care Plan and Follow-up.  Plan: The  Patient has been provided with contact information for the Managed Medicaid care management team and has been advised to call with any health related questions or concerns.    Abelino Derrick, MHA [facility]  Managed [facility] Social Worker [phone]

## 2023-12-25 ENCOUNTER — Other Ambulatory Visit: Payer: Self-pay | Admitting: Licensed Clinical Social Worker

## 2023-12-25 NOTE — Patient Outreach (Signed)
 Medicaid Managed Care Social Work Note  12/25/2023 Name:  CHAUNA LASHLEY MRN:  [id number] DOB:  07-03-1971  YOSELIN PEED is an 53 y.o. year old female who is a primary patient of Maxie Spaniel, MD.  The Medicaid Managed Care Coordination team was consulted for assistance with:  Mental Health Counseling and Resources Grief Counseling  Ms. Legault was given information about Medicaid Managed Care Coordination team services today. [name] Patient agreed to services and verbal consent obtained.  Engaged with patient  for by telephone forfollow up visit in response to referral for case management and/or care coordination services.   Patient is participating in a Managed Medicaid Plan:  Yes  Assessments/Interventions:  Review of past medical history, allergies, medications, health status, including review of consultants reports, laboratory and other test data, was performed as part of comprehensive evaluation and provision of chronic care management services.  SDOH: (Social Drivers of Health) assessments and interventions performed: SDOH Interventions    Flowsheet Row Patient Outreach Telephone from 12/25/2023 in [place] POPULATION HEALTH DEPARTMENT Patient Outreach Telephone from 09/24/2023 in [place] POPULATION HEALTH DEPARTMENT Patient Outreach Telephone from 09/10/2023 in [place] POPULATION HEALTH DEPARTMENT Patient Outreach Telephone from 08/22/2023 in [place] POPULATION HEALTH DEPARTMENT Patient Outreach Telephone from 07/25/2023 in [place] POPULATION HEALTH DEPARTMENT Patient Outreach Telephone from 07/11/2023 in [place] POPULATION HEALTH DEPARTMENT  SDOH Interventions        Stress Interventions Provide Counseling, Community Resources Provided [employer], Provide Counseling Offered [employer] Resources, Provide Counseling Offered Community Wellness Resources, Provide Counseling Offered Community Wellness Resources, Provide  Counseling Offered Community Wellness Resources, Provide Counseling       Advanced Directives Status:  See Care Plan for related entries.  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   Vaginal candidiasis 01/03/2023   Elevated LFTs 01/01/2022   STI (sexually transmitted infection) 06/27/2021   Amenorrhea 06/26/2021   Tobacco abuse 01/30/2018   Bipolar disorder (HCC) 06/14/2016   Vitamin D  deficiency 02/04/2016   Encounter for screening for cervical cancer 03/17/2015   Healthcare maintenance 03/17/2015   Anxiety disorder 06/04/2014   Asthma in adult 06/04/2014   Hypertension 06/04/2014   Chronic allergic rhinitis 06/04/2014    Conditions to be addressed/monitored per PCP order:  Depression  Care Plan : LCSW Plan of Care  Updates made by Elie [place], LCSW since 12/25/2023 12:00 AM     Problem: Depression Identification (Depression)      Goal: Grief Symptoms Identified   Note:   Priority: High  Timeframe:  Short-Range Goal Priority:  High Start Date:   06/27/23              Expected End Date:  ongoing                     Discharge date- 12/25/23  - check out bereavement counseling and long term counseling options -resources sent to you by email  - keep 90 percent of counseling appointments - schedule initial counseling appointment    Why is this important?             Beating grief and depression may take some time.  If you don't feel better right away, don't give up on your treatment plan.    Current barriers:   Chronic Mental Health needs related to bipolar and grief from losing her sister in May of 2024. Patient requires Support, Education, Resources, Referrals, Advocacy, and Care Coordination, in order to meet Unmet Mental Health Needs. Patient will  implement clinical interventions discussed today to decrease symptoms of grief and increase knowledge and/or ability of: coping skills. Mental Health Concerns and Social Isolation Patient lacks knowledge of available community counseling agencies and resources.  Clinical Goal(s): verbalize understanding of plan for management of Grief, Bipolar, Depression, and Stress and demonstrate a reduction in symptoms. Patient will connect with a provider for ongoing mental health treatment, increase coping skills, healthy habits, self-management skills, and stress reduction        Clinical Interventions:  Assessed patient's previous and current treatment, coping skills, support system and barriers to care. Patient recently lost her sister tragically to cancer after two weeks of finding out. Patient lost her mother in 2016. She has an amazing support system of family members. She resides with her father and brother.  Verbalization of feelings encouraged, motivational interviewing employed Emotional support provided, positive coping strategies explored Self care emphasized Patient reports significant worsening grief impacting their ability to function appropriately and carry out daily task. Patient receives strong support from her children as well who recently got her a new shih tzu puppy named buttercup. LCSW provided education on relaxation techniques such as meditation, deep breathing, massage, grounding exercises or yoga that can activate the body's relaxation response and ease symptoms of stress and anxiety. LCSW ask that when pt is struggling with difficult emotions and racing thoughts that they start this relaxation response process. LCSW provided extensive education on healthy coping skills for anxiety. SW used active and reflective listening, validated patient's feelings/concerns, and provided emotional support. Patient will work on implementing appropriate self-care habits into their daily routine such  as: staying positive, writing a gratitude list, drinking water, staying active around the house, taking their medications as prescribed, combating negative thoughts or emotions and staying connected with their family and friends. Positive reinforcement provided for this decision to work on this. Patient has issues with sleep. LCSW provided education on healthy sleep hygiene and what that looks like. LCSW encouraged patient to implement a night time routine into their schedule that works best for them and that they are able to maintain. Advised patient to implement deep breathing/grounding/meditation/self-care exercises into their nightly routine to combat racing thoughts at night. LCSW encouraged patient to wake up at the same time each day, make their sleeping environment comfortable, exercise when able, to limit naps and to not eat or drink anything right before bed.  Motivational Interviewing employed Depression screen reviewed  PHQ2/ PHQ9 completed Mindfulness or Relaxation training provided Active listening / Reflection utilized  Advance Care and HCPOA education provided Emotional Support Provided Problem Solving /Task Center strategies reviewed Provided psychoeducation for mental health needs  Provided brief CBT  Reviewed mental health medications and discussed importance of compliance:  Quality of sleep assessed & Sleep Hygiene techniques promoted  Participation in counseling encouraged  Verbalization of feelings encouraged  Suicidal Ideation/Homicidal Ideation assessed: Patient denies SI/HI  Review resources, discussed options and provided patient information about  Mental Health Resources Inter-disciplinary care team collaboration (see longitudinal plan of care) Patient wishes for grief support/therapy resources to be mailed to her address instead of email. She will review these resources and make a  decision on where she wishes to gain services at. [facility] LCSW will assist with this grief  support and program enrollment process. [facility] LCSW mailed resources on 06/27/23. Update - Patient successfully received resources that were mailed out to her but reports that she just got them Monday and would like two more weeks to review. She reports also needing food and financial support so an additional referral was made for [facility] BSW. Brief emotional support and coping skill education provided to patient today. Update- Patient reports maintaining stability and managing her grief as best as possible with the new addition of her puppy Buttercup. She reports that she does not wish for an additional referral for grief counseling with [facility] as wishes to wait for her initial counseling sessions at [facility] on 09/06/23. [facility] LCSW will continue to support patient until she is established with a long term mental health provider. 08/22/23 Update- Patient reports that she is doing well and is looking forward to initial virtual therapy appointment in a few weeks. She shares that she received food support resources from [facility] BSW during their last appointment and they have been helpful. She was provided [facility] contact information in case she has issues signing into her upcoming virtual therapy appointment. [facility] LCSW provided brief grief management, self-care and breath work education. [facility] LCSW will follow up next month to ensure patient was established successfully with a long term therapist. Update- Patient reports she was a no show for her therapy appointment this past 09/06/23 because she never received a link to log into the session. [facility] LCSW and patient made joint phone call to [facility] to attempt rescheduling this for an in person meeting but was unsuccessful in reaching anyone. Patient agreed to try again tomorrow and contact information was provided again. [facility] LCSW educated her on their enrollment process at [facility]. Brief self-care education provided as well. Update- Patient reports that she has decided that she does not wish  to pursue [facility] treatment until after the holidays. Patient prefers for [facility] LCSW to follow up in 90 days. Brief self-care education provided.  12/25/23 update- Patient shares that she is doing well and adjusting to her new way of life well. She shares that she gains a lot of support from her dog Buttercup, father and siblings. She states that she is not interested in starting behavioral health therapy at this time but will notify [facility] LCSW directly or PCP if she changes her mind at anytime in the future. [facility] LCSW will sign off at this time as all needs have been met. Patient was provided positive reinforcement for recent healthy self care implementation.   Patient Goals/Self-Care Activities: Over the next 120 days Attend scheduled medical appointments Utilize healthy coping skills and supportive resources discussed Contact PCP with any questions or concerns Keep 90 percent of counseling appointments Call your insurance provider for more information about your Enhanced Benefits  Check out counseling resources provided  Begin personal counseling with LCSW, to reduce and manage symptoms of Depression and Stress, until well-established with mental health provider Accept all calls from mental health representatives as an effort to establish ongoing mental health counseling and supportive grief services.  Incorporate into daily practice - relaxation techniques, deep breathing exercises, and mindfulness meditation strategies. Talk about feelings with friends, family members, spiritual advisor, etc. Contact LCSW directly [phone]), if you have questions, need assistance, or if additional social work needs are identified between now and our next scheduled telephone outreach call. Call [phone] for mental health  hotline/crisis line if needed (24/7 available) Try techniques to reduce symptoms of anxiety/negative thinking (deep breathing, distraction, positive self talk, etc)  - develop a personal safety plan -  develop a plan to deal with triggers like holidays, anniversaries - exercise at least 2 to 3 times per week - have a plan for how to handle bad days - journal feelings and what helps to feel better or worse - spend time or talk with others at least 2 to 3 times per week - watch for early signs of feeling worse - begin personal counseling - call and visit an old friend - check out volunteer opportunities - join a support group - laugh; watch a funny movie or comedian - learn and use visualization or guided imagery - perform a random act of kindness - practice relaxation or meditation daily - start or continue a personal journal - practice positive thinking and self-talk -continue with compliance of taking medication  -identify current effective and ineffective coping strategies.  -implement positive self-talk in care to increase self-esteem, confidence and feelings of control.  -consider alternative and complementary therapy approaches such as meditation, mindfulness or yoga.  -journaling, prayer, worship services, meditation or pastoral counseling.  -increase participation in pleasurable group activities such as hobbies, singing, sports or volunteering).  -consider the use of meditative movement therapy such as tai chi, yoga or qigong.  -start a regular daily exercise program based on tolerance, ability and patient choice to support positive thinking and activity     Managing Loss, Adult People experience loss in many different ways throughout their lives. Events such as moving, changing jobs, and losing friends can create a sense of loss. The loss may be as serious as a major health change, divorce, death of a pet, or death of a loved one. All of these types of loss are likely to create a physical and emotional reaction known as grief. Grief is the result of a major change or an absence of something or someone that you count on. Grief is a normal reaction to loss. A variety of factors can  affect your grieving experience, including: The nature of your loss. Your relationship to what or whom you lost. Your understanding of grief and how to manage it. Your support system. How to manage lifestyle changes Keep to your normal routine as much as possible. If you have trouble focusing or doing normal activities, it is acceptable to take some time away from your normal routine. Spend time with friends and loved ones. Eat a healthy diet, get plenty of sleep, and rest when you feel tired. How to recognize changes  The way that you deal with your grief will affect your ability to function as you normally do. When grieving, you may experience these changes: Numbness, shock, sadness, anxiety, anger, denial, and guilt. Thoughts about death. Unexpected crying. A physical sensation of emptiness in your stomach. Problems sleeping and eating. Tiredness (fatigue). Loss of interest in normal activities. Dreaming about or imagining seeing the person who died. A need to remember what or whom you lost. Difficulty thinking about anything other than your loss for a period of time. Relief. If you have been expecting the loss for a while, you may feel a sense of relief when it happens. Follow these instructions at home:    Activity Express your feelings in healthy ways, such as: Talking with others about your loss. It may be helpful to find others who have had a similar loss, such as a  support group. Writing down your feelings in a journal. Doing physical activities to release stress and emotional energy. Doing creative activities like painting, sculpting, or playing or listening to music. Practicing resilience. This is the ability to recover and adjust after facing challenges. Reading some resources that encourage resilience may help you to learn ways to practice those behaviors. General instructions Be patient with yourself and others. Allow the grieving process to happen, and remember that  grieving takes time. It is likely that you may never feel completely done with some grief. You may find a way to move on while still cherishing memories and feelings about your loss. Accepting your loss is a process. It can take months or longer to adjust. Keep all follow-up visits as told by your health care provider. This is important. Where to find support To get support for managing loss: Ask your health care provider for help and recommendations, such as grief counseling or therapy. Think about joining a support group for people who are managing a loss. Where to find more information You can find more information about managing loss from: American Society of Clinical Oncology: www.cancer.net American Psychological Association: [employer].ca Contact a health care provider if: Your grief is extreme and keeps getting worse. You have ongoing grief that does not improve. Your body shows symptoms of grief, such as illness. You feel depressed, anxious, or lonely. Get help right away if: You have thoughts about hurting yourself or others. If you ever feel like you may hurt yourself or others, or have thoughts about taking your own life, get help right away. You can go to your nearest emergency department or call: Your local emergency services (911 in the [place] A suicide crisis helpline, such as the National Suicide Prevention Lifeline at 215 495 8249. This is open 24 hours a day. Summary Grief is the result of a major change or an absence of someone or something that you count on. Grief is a normal reaction to loss. The depth of grief and the period of recovery depend on the type of loss and your ability to adjust to the change and process your feelings. Processing grief requires patience and a willingness to accept your feelings and talk about your loss with people who are supportive. It is important to find resources that work for you and to realize that people experience grief differently.  There is not one grieving process that works for everyone in the same way. Be aware that when grief becomes extreme, it can lead to more severe issues like isolation, depression, anxiety, or suicidal thoughts. Talk with your health care provider if you have any of these issues. This information is not intended to replace advice given to you by your health care provider. Make sure you discuss any questions you have with your health care provider. Document Revised: 01/30/2019 Document Reviewed: 04/11/2017 Elsevier Patient Education  2020 [employer].  Help for Managing Grief   When you are experiencing grief, many resources and support are available to help you. You are not alone.    Grief Share (https://www.SunglassSpecialist.gl):    [employer] of [address] [place], [place]      [employer]  [phone] [address] [place], [place]     [employer] [address] [place], [place]      [employer] [address] [place], [place]  [place]- [phone]   [facility] [address] [place], [place] [phone]   [place]  [address] [place], [place]     [phone]   [facility] [address] [place], [place]     [phone]   If a Hospice or Palliative Care team has been involved in the care of your loved one, you may reach out to your contact there or locally, you may reach out to [facility]:    [facility] [place] [facility]   [address] [place], [place] [address] [phone]   [facility]  [facility] [place], [place]   [address] [place], [place], [place] [address] Phone: [phone]       Follow up:  Patient requests no follow-up at this time.  Plan: The Managed Medicaid care management team is available to follow up with the patient after provider conversation with the  patient regarding recommendation for care management engagement and subsequent re-referral to the care management team.   Kolleen Perone, BSW, MSW, LCSW Licensed Clinical Social Worker [place]   [email or web address] Direct Dial: [phone]

## 2023-12-25 NOTE — Patient Instructions (Signed)
 Visit Information  [name] was given information about Medicaid Managed Care team care coordination services as a part of their Healthy [facility] Medicaid benefit. [name] verbally consented to engagement with the [facility] Managed Care team.   If you are experiencing a medical emergency, please call 911 or report to your local emergency department or urgent care.   If you have a non-emergency medical problem during routine business hours, please contact your provider's office and ask to speak with a nurse.   For questions related to your Healthy [facility] health plan, please call: [phone] or visit the homepage here: [email or web address]  If you would like to schedule transportation through your Healthy [facility] plan, please call the following number at least 2 days in advance of your appointment: [phone]  For information about your ride after you set it up, call Ride Assist at [phone]. Use this number to activate a Will Call pickup, or if your transportation is late for a scheduled pickup. Use this number, too, if you need to make a change or cancel a previously scheduled reservation.  If you need transportation services right away, call [phone]. The after-hours call center is staffed 24 hours to handle ride assistance and urgent reservation requests (including discharges) 365 days a year. Urgent trips include sick visits, hospital discharge requests and life-sustaining treatment.  Call the [facility] Line at [phone], at any time, 24 hours a day, 7 days a week. If you are in danger or need immediate medical attention call 911.  If you would like help to quit smoking, call 1-800-QUIT-NOW (714-780-0986) OR Espaol: 1-855-Djelo-Ya (4-742-595-6387) o para ms informacin haga clic aqu or Text READY to 564-332 to register via text  Following is a copy of your plan of care:  Care Plan : LCSW Plan of Care   Updates made by Elie Grove, LCSW since 12/25/2023 12:00 AM     Problem: Depression Identification (Depression)      Goal: Grief Symptoms Identified   Note:   Priority: High  Timeframe:  Short-Range Goal Priority:  High Start Date:   06/27/23              Expected End Date:  ongoing                     Discharge date- 12/25/23  - check out bereavement counseling and long term counseling options -resources sent to you by email  - keep 90 percent of counseling appointments - schedule initial counseling appointment    Why is this important?             Beating grief and depression may take some time.            If you don't feel better right away, don't give up on your treatment plan.    Current barriers:   Chronic Mental Health needs related to bipolar and grief from losing her sister in May of 2024. Patient requires Support, Education, Resources, Referrals, Advocacy, and Care Coordination, in order to meet Unmet Mental Health Needs. Patient will implement clinical interventions discussed today to decrease symptoms of grief and increase knowledge and/or ability of: coping skills. Mental Health Concerns and Social Isolation Patient lacks knowledge of available community counseling agencies and resources.  Clinical Goal(s): verbalize understanding of plan for management of Grief, Bipolar, Depression, and Stress and demonstrate a reduction in symptoms. Patient will connect with a provider for ongoing mental health treatment, increase coping skills, healthy habits,  self-management skills, and stress reduction        Patient Goals/Self-Care Activities: Over the next 120 days Attend scheduled medical appointments Utilize healthy coping skills and supportive resources discussed Contact PCP with any questions or concerns Keep 90 percent of counseling appointments Call your insurance provider for more information about your Enhanced Benefits  Check out counseling resources provided   Begin personal counseling with LCSW, to reduce and manage symptoms of Depression and Stress, until well-established with mental health provider Accept all calls from mental health representatives as an effort to establish ongoing mental health counseling and supportive grief services.  Incorporate into daily practice - relaxation techniques, deep breathing exercises, and mindfulness meditation strategies. Talk about feelings with friends, family members, spiritual advisor, etc. Contact LCSW directly [phone]), if you have questions, need assistance, or if additional social work needs are identified between now and our next scheduled telephone outreach call. Call 988 for mental health hotline/crisis line if needed (24/7 available) Try techniques to reduce symptoms of anxiety/negative thinking (deep breathing, distraction, positive self talk, etc)  - develop a personal safety plan - develop a plan to deal with triggers like holidays, anniversaries - exercise at least 2 to 3 times per week - have a plan for how to handle bad days - journal feelings and what helps to feel better or worse - spend time or talk with others at least 2 to 3 times per week - watch for early signs of feeling worse - begin personal counseling - call and visit an old friend - check out volunteer opportunities - join a support group - laugh; watch a funny movie or comedian - learn and use visualization or guided imagery - perform a random act of kindness - practice relaxation or meditation daily - start or continue a personal journal - practice positive thinking and self-talk -continue with compliance of taking medication  -identify current effective and ineffective coping strategies.  -implement positive self-talk in care to increase self-esteem, confidence and feelings of control.  -consider alternative and complementary therapy approaches such as meditation, mindfulness or yoga.  -journaling, prayer, worship  services, meditation or pastoral counseling.  -increase participation in pleasurable group activities such as hobbies, singing, sports or volunteering).  -consider the use of meditative movement therapy such as tai chi, yoga or qigong.  -start a regular daily exercise program based on tolerance, ability and patient choice to support positive thinking and activity     Managing Loss, Adult People experience loss in many different ways throughout their lives. Events such as moving, changing jobs, and losing friends can create a sense of loss. The loss may be as serious as a major health change, divorce, death of a pet, or death of a loved one. All of these types of loss are likely to create a physical and emotional reaction known as grief. Grief is the result of a major change or an absence of something or someone that you count on. Grief is a normal reaction to loss. A variety of factors can affect your grieving experience, including: The nature of your loss. Your relationship to what or whom you lost. Your understanding of grief and how to manage it. Your support system. How to manage lifestyle changes Keep to your normal routine as much as possible. If you have trouble focusing or doing normal activities, it is acceptable to take some time away from your normal routine. Spend time with friends and loved ones. Eat a healthy diet, get plenty of sleep,  and rest when you feel tired. How to recognize changes  The way that you deal with your grief will affect your ability to function as you normally do. When grieving, you may experience these changes: Numbness, shock, sadness, anxiety, anger, denial, and guilt. Thoughts about death. Unexpected crying. A physical sensation of emptiness in your stomach. Problems sleeping and eating. Tiredness (fatigue). Loss of interest in normal activities. Dreaming about or imagining seeing the person who died. A need to remember what or whom you  lost. Difficulty thinking about anything other than your loss for a period of time. Relief. If you have been expecting the loss for a while, you may feel a sense of relief when it happens. Follow these instructions at home:    Activity Express your feelings in healthy ways, such as: Talking with others about your loss. It may be helpful to find others who have had a similar loss, such as a support group. Writing down your feelings in a journal. Doing physical activities to release stress and emotional energy. Doing creative activities like painting, sculpting, or playing or listening to music. Practicing resilience. This is the ability to recover and adjust after facing challenges. Reading some resources that encourage resilience may help you to learn ways to practice those behaviors. General instructions Be patient with yourself and others. Allow the grieving process to happen, and remember that grieving takes time. It is likely that you may never feel completely done with some grief. You may find a way to move on while still cherishing memories and feelings about your loss. Accepting your loss is a process. It can take months or longer to adjust. Keep all follow-up visits as told by your health care provider. This is important. Where to find support To get support for managing loss: Ask your health care provider for help and recommendations, such as grief counseling or therapy. Think about joining a support group for people who are managing a loss. Where to find more information You can find more information about managing loss from: American Society of Clinical Oncology: www.cancer.net American Psychological Association: DiceTournament.ca Contact a health care provider if: Your grief is extreme and keeps getting worse. You have ongoing grief that does not improve. Your body shows symptoms of grief, such as illness. You feel depressed, anxious, or lonely. Get help right away if: You have  thoughts about hurting yourself or others. If you ever feel like you may hurt yourself or others, or have thoughts about taking your own life, get help right away. You can go to your nearest emergency department or call: Your local emergency services (911 in the [place] A suicide crisis helpline, such as the National Suicide Prevention Lifeline at (831) 491-2036. This is open 24 hours a day. Summary Grief is the result of a major change or an absence of someone or something that you count on. Grief is a normal reaction to loss. The depth of grief and the period of recovery depend on the type of loss and your ability to adjust to the change and process your feelings. Processing grief requires patience and a willingness to accept your feelings and talk about your loss with people who are supportive. It is important to find resources that work for you and to realize that people experience grief differently. There is not one grieving process that works for everyone in the same way. Be aware that when grief becomes extreme, it can lead to more severe issues like isolation, depression, anxiety, or suicidal thoughts.  Talk with your health care provider if you have any of these issues. This information is not intended to replace advice given to you by your health care provider. Make sure you discuss any questions you have with your health care provider. Document Revised: 01/30/2019 Document Reviewed: 04/11/2017 Elsevier Patient Education  2020 [employer].  Help for Managing Grief   When you are experiencing grief, many resources and support are available to help you. You are not alone.    Grief Share (https://www.SunglassSpecialist.gl):    [employer] of [address] [place], [place]      [facility]  [phone] [address] [place], [place]     [facility] [address] [place], [place]      [facility] [address] [place], [place]  [phone]   [facility] [address] [place], [place] [phone]   [facility] [address] [place], [place]     [phone]   [facility] [address] [place], [place]     [phone]   If a Hospice or Palliative Care team has been involved in the care of your loved one, you may reach out to your contact there or locally, you may reach out to [facility]:    [facility]   [address] [place], [place] [address] [phone]   [facility]  [facility] in [place], [place]   [address] [place], [place], [place] [address] Phone: [phone]  Kolleen Perone, BSW, MSW, LCSW Licensed Clinical Social Worker [facility]   [email or web address] Direct Dial: [phone]

## 2024-02-06 ENCOUNTER — Other Ambulatory Visit: Payer: Self-pay | Admitting: Student

## 2024-02-06 DIAGNOSIS — Z1231 Encounter for screening mammogram for malignant neoplasm of breast: Secondary | ICD-10-CM

## 2024-02-10 ENCOUNTER — Ambulatory Visit: Payer: Medicaid Other

## 2024-02-26 ENCOUNTER — Ambulatory Visit
Admission: RE | Admit: 2024-02-26 | Discharge: 2024-02-26 | Disposition: A | Discharge status: Home / Self Care | Source: Ambulatory Visit | Attending: Family Medicine | Admitting: Family Medicine

## 2024-02-26 DIAGNOSIS — Z1231 Encounter for screening mammogram for malignant neoplasm of breast: Secondary | ICD-10-CM | POA: Diagnosis not present

## 2024-05-19 ENCOUNTER — Encounter: Payer: Self-pay | Admitting: *Deleted

## 2024-06-02 ENCOUNTER — Encounter: Payer: Self-pay | Admitting: Student

## 2024-06-02 NOTE — Progress Notes (Deleted)
 06/17/2023  Chronic allergic rhinitis Patient has been out of her allergy medicine for the past month. Says allergies have been worse recently.   Plan: -Begin Zyrtec . -Discussed holding Zyrtec  when taking Atarax .   Asthma in adult Patient says she is having some asthma symptoms, predominantly when she is crying.  Denies nocturnal awakenings, endorses some shortness of breath during fits of grief.  Lungs clear to auscultation today. Plan: -Restart montelukast , albuterol . -Flonase  for allergies -Zyrtec  for allergies as needed, discussed overlapping effects of hydroxyzine  with patient   Healthcare maintenance Patient was unable to follow-up with GI to get colonoscopy scheduled. Hesitant to get scoped. We discussed the importance of colorectal cancer screening.  Patient amenable to scheduling colonoscopy. Plan: -GI referral for colonoscopy.   Grief Lost her sister in May.  She was diagnosed with stage IV breast cancer, and passed away sadly 2 weeks later.  Patient has had trouble processing.  She has lost nearly 20 pounds since we last saw her.  She says he has no appetite recently, following the loss of her sister.  She denies any weight loss or loss of appetite prior to her loss. Plan: -Restart Seroquel , Remeron  -Referral to integrated behavioral health -CMP today to check for electrolyte abnormalities in the setting of profound weight loss.   Bipolar disorder (HCC) History of bipolar disorder, not currently taking any medications.  Says she recently moved in with her father and her medication was lost during that transition.  Recently lost her sister and has had loss of appetite, sadness, insomnia since then.  She denies any manic episodes.  Patient does follow-up with psychiatry, however she does not met  them since beginning of the pandemic Plan: -Restart risperidone 25 -Restart Remeron  15 -Atarax  PRN for anxiety -Referral for psychiatric management.      Hypertension Hypertension, BP 164/103 today. Has not been taking medicine (lost during move) Plan: -Refill Amlodipine  -Follow-up blood pressures 1 month  Physical? Pap*** ***Overdue for colonoscopy ***Vaccines pneumococcal, zoster, COVID    CC: {Clinic Visit Type:31040}  HPI:  [name] is a 53 y.o. female with pertinent PMH of HTN, asthma, chronic allergic rhinitis, anxiety, bipolar, vitamin D  deficiency who presents as above. Please see assessment and plan below for further details.  Medications: Current Outpatient Medications  Medication Instructions   albuterol  (VENTOLIN  HFA) 108 (90 Base) MCG/ACT inhaler 2 puffs, Inhalation, Every 6 hours PRN   amLODipine  (NORVASC ) 5 mg, Oral, Daily   cetirizine  (ZYRTEC  ALLERGY) 10 mg, Oral, Daily   fluconazole  (DIFLUCAN ) 150 mg, Oral, Daily   fluticasone  (FLONASE  SENSIMIST) 27.5 MCG/SPRAY nasal spray 2 sprays, Nasal, Daily PRN   fluticasone  (FLONASE ) 50 MCG/ACT nasal spray 1 spray, Each Nare, Daily   hydrOXYzine  (ATARAX ) 25 MG tablet Take 1 tablet (25 mg total) by mouth every 8 (eight) hours as needed. For anxiety   ketotifen  (ZADITOR ) 0.025 % ophthalmic solution 1 drop, Both Eyes, 2 times daily   mirtazapine  (REMERON ) 15 mg, Oral, Daily at bedtime   montelukast  (SINGULAIR ) 10 mg, Oral, Daily at bedtime   Multiple Vitamins-Minerals (ONE-A-DAY 50 PLUS PO) Oral   QUEtiapine  (SEROQUEL ) 25 mg, Oral, Daily at bedtime   Vitamin D , Cholecalciferol , 25 MCG (1000 UT) TABS 1 tablet, Oral, Daily     Review of Systems:   Pertinent items noted in HPI and/or A&P.  Physical Exam:  There were no vitals filed for this visit.  Constitutional:***. In no acute distress. HEENT: Normocephalic, atraumatic, Sclera non-icteric, PERRL, EOM intact Cardio:Regular rate and  rhythm. 2+ bilateral {PulseLoc:28294} pulses. Pulm:Clear to auscultation bilaterally. Normal work of breathing on room air. Abdomen: Soft, non-tender, non-distended, positive bowel  sounds. FDX:Wzhjupcz for extremity edema. Skin:Warm and dry. Neuro:Alert and oriented x3. No focal deficit noted. Psych:Pleasant mood and affect.   Assessment & Plan:   No problem-specific Assessment & Plan notes found for this encounter.    Patient {GC/GE:3044014::discussed with,seen with} {JGIMTSattending2025/2026:32954}  Fairy Pool, DO [facility] Internal Medicine Resident PGY-2 Clinic Phone: [phone] Please contact the on call pager at [phone] for any urgent or emergent needs.

## 2024-06-14 NOTE — Assessment & Plan Note (Signed)
 Cough and asthma symptoms are stable. She denies increased cough, no increase in inhaler use, no nocturnal awakening, and no shortness of breath. On physical exam, lungs are clear without wheezing. The patient is currently using Ventolin  PRN, and we will continue this treatment as before.

## 2024-06-14 NOTE — Assessment & Plan Note (Addendum)
 Patient reports an average home blood pressure of 140/68. She has been out of her prescribed medication for the past three months but has been taking leftover medication she had on hand. Her blood pressure today is 160/96. She denies headache, chest pain, shortness of breath, or issues with medication adherence. Lung and heart exams, as well as pulse examination, were normal. A prescription for amlodipine  5 mg daily was sent to the pharmacy.  Due to a personal commitment, she had to leave early, so we scheduled a follow-up appointment in one month to check her BMP.

## 2024-06-14 NOTE — Assessment & Plan Note (Signed)
 Ask about tobacco use, and any need for cessation,

## 2024-06-14 NOTE — Progress Notes (Unsigned)
 Patient name: [name] Date of birth: August 17, 1971 Date of visit: 06/15/24  Type of visit: Complete Physical Exam   Subjective   Chief concern: HTN F/U   [name] is a 53 y.o. female with a PMHx of HTN, Asthma and anxiety who presents to [facility] clinic for following up her HTN medication and complete physical exam.   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   Elevated LFTs 01/01/2022   STI (sexually transmitted infection) 06/27/2021   Amenorrhea 06/26/2021   Tobacco abuse 01/30/2018   Bipolar disorder (HCC) 06/14/2016   Vitamin D  deficiency 02/04/2016   Encounter for screening for cervical cancer 03/17/2015   Healthcare maintenance 03/17/2015   Anxiety disorder 06/04/2014   Asthma in adult 06/04/2014   Hypertension 06/04/2014   Chronic allergic rhinitis 06/04/2014     Past Surgical History:  Procedure Laterality Date   NO PAST SURGERIES      ROS  Current Outpatient Medications  Medication Instructions   albuterol  (VENTOLIN  HFA) 108 (90 Base) MCG/ACT inhaler 2 puffs, Inhalation, Every 6 hours PRN   amLODipine  (NORVASC ) 5 mg, Oral, Daily   cetirizine  (ZYRTEC  ALLERGY) 10 mg, Oral, Daily   fluconazole  (DIFLUCAN ) 150 mg, Oral, Daily   fluticasone  (FLONASE  SENSIMIST) 27.5 MCG/SPRAY nasal spray 2 sprays, Nasal, Daily PRN   fluticasone  (FLONASE ) 50 MCG/ACT nasal spray 1 spray, Each Nare, Daily   hydrOXYzine  (ATARAX ) 25 MG tablet Take 1 tablet (25 mg total) by mouth every 8 (eight) hours as needed. For anxiety   ketotifen  (ZADITOR ) 0.025 % ophthalmic solution 1 drop, Both Eyes, 2 times daily   mirtazapine  (REMERON ) 15 mg, Oral, Daily at bedtime   montelukast  (SINGULAIR ) 10 mg, Oral, Daily at bedtime   Multiple Vitamins-Minerals (ONE-A-DAY 50 PLUS PO) Oral   QUEtiapine  (SEROQUEL ) 25 mg, Oral, Daily at bedtime   Vitamin D , Cholecalciferol , 25 MCG (1000 UT) TABS 1 tablet, Oral, Daily    Social  History   Tobacco Use   Smoking status: Every Day    Current packs/day: 0.30    Average packs/day: 0.3 packs/day for 25.0 years (7.5 ttl pk-yrs)    Types: Cigarettes   Smokeless tobacco: Never  Vaping Use   Vaping status: Never Used  Substance Use Topics   Alcohol use: Yes    Alcohol/week: 2.0 standard drinks of alcohol    Types: 1 Glasses of wine, 1 Cans of beer per week    Comment: Socially., less than monthly   Drug use: Yes    Types: Marijuana    Comment: Socially.      Objective  Today's Vitals   06/15/24 0959 06/15/24 1000  BP: (!) 166/93 (!) 160/96  Pulse: 79 79  Temp: 97.9 F (36.6 C)   TempSrc: Oral   SpO2: 97%   Weight: 113 lb (51.3 kg)   Height: 5' 4 (1.626 m)   Body mass index is 19.4 kg/m.   Physical Exam  General: Alert, no acute distress.  HEENT: Soft, supple, with full range of motion. No cervical lymphadenopathy or thyromegaly appreciated. Cardiac: Regular rate and rhythm, no murmurs Lungs: Clear to auscultation bilaterally in all fields, with no wheezes or crackles appreciated      Assessment & Plan  Problem List Items Addressed This Visit       Cardiovascular and Mediastinum   Hypertension - Primary   Patient reports an average home blood pressure of 140/68. She has  been out of her prescribed medication for the past three months but has been taking leftover medication she had on hand. Her blood pressure today is 160/96. She denies headache, chest pain, shortness of breath, or issues with medication adherence. Lung and heart exams, as well as pulse examination, were normal. A prescription for amlodipine  5 mg daily was sent to the pharmacy.  Due to a personal commitment, she had to leave early, so we scheduled a follow-up appointment in one month to check her BMP.        Relevant Medications   amLODipine  (NORVASC ) 5 MG tablet     Respiratory   Asthma in adult   Cough and asthma symptoms are stable. She denies increased cough, no  increase in inhaler use, no nocturnal awakening, and no shortness of breath. On physical exam, lungs are clear without wheezing. The patient is currently using Ventolin  PRN, and we will continue this treatment as before.        Other   Healthcare maintenance   The patient's last Pap smear was done three years ago, and she expressed a desire to have it done again. We also recommended the pneumococcal vaccine, and she agreed. However, she had to leave early today due to personal tasks and limited time. Both the Pap smear and vaccine will be completed at her next visit in one month.  She lost her sister nearly a year ago due to breast cancer. She had a mammogram in March 2025, which was normal. Due to her dense breast tissue and positive family history of breast cancer, we would like to discuss the option of a breast MRI for further evaluation.      Grief   She is still coping with the loss of your sister, we recommend continuing integrated behavioral therapy with [name] to support her mental well-being. Additionally, we have prescribed mirtazapine  15 mg to be taken every night to improve mental health and also help her appetite too.       No follow-ups on file.  Patient discussed with Dr. Trudy, who also saw and evaluated the patient.  Armando Rossetti, MD [facility]  PGY-1 06/15/2024, 11:49 AM

## 2024-06-14 NOTE — Assessment & Plan Note (Signed)
 The patient's last Pap smear was done three years ago, and she expressed a desire to have it done again. We also recommended the pneumococcal vaccine, and she agreed. However, she had to leave early today due to personal tasks and limited time. Both the Pap smear and vaccine will be completed at her next visit in one month.  She lost her sister nearly a year ago due to breast cancer. She had a mammogram in March 2025, which was normal. Due to her dense breast tissue and positive family history of breast cancer, we would like to discuss the option of a breast MRI for further evaluation.

## 2024-06-15 ENCOUNTER — Ambulatory Visit: Payer: Self-pay

## 2024-06-15 ENCOUNTER — Other Ambulatory Visit (HOSPITAL_COMMUNITY): Payer: Self-pay

## 2024-06-15 VITALS — BP 160/96 | HR 79 | Temp 97.9°F | Ht 64.0 in | Wt 113.0 lb

## 2024-06-15 DIAGNOSIS — F4321 Adjustment disorder with depressed mood: Secondary | ICD-10-CM | POA: Diagnosis not present

## 2024-06-15 DIAGNOSIS — J45909 Unspecified asthma, uncomplicated: Secondary | ICD-10-CM

## 2024-06-15 DIAGNOSIS — I10 Essential (primary) hypertension: Secondary | ICD-10-CM

## 2024-06-15 DIAGNOSIS — Z720 Tobacco use: Secondary | ICD-10-CM

## 2024-06-15 DIAGNOSIS — J4520 Mild intermittent asthma, uncomplicated: Secondary | ICD-10-CM

## 2024-06-15 DIAGNOSIS — Z0000 Encounter for general adult medical examination without abnormal findings: Secondary | ICD-10-CM

## 2024-06-15 MED ORDER — MIRTAZAPINE 15 MG PO TABS
15.0000 mg | ORAL_TABLET | Freq: Every day | ORAL | 3 refills | Status: DC
  Filled 2024-06-15: qty 90, 90d supply, fill #0

## 2024-06-15 MED ORDER — AMLODIPINE BESYLATE 5 MG PO TABS
5.0000 mg | ORAL_TABLET | Freq: Every day | ORAL | 3 refills | Status: DC
  Filled 2024-06-15 – 2024-06-25 (×2): qty 90, 90d supply, fill #0

## 2024-06-15 MED ORDER — MIRTAZAPINE 15 MG PO TABS
15.0000 mg | ORAL_TABLET | Freq: Every day | ORAL | 3 refills | Status: DC
  Filled 2024-06-15 – 2024-06-25 (×2): qty 90, 90d supply, fill #0

## 2024-06-15 NOTE — Assessment & Plan Note (Signed)
 She is still coping with the loss of your sister, we recommend continuing integrated behavioral therapy with [facility] to support her mental well-being. Additionally, we have prescribed mirtazapine  15 mg to be taken every night to improve mental health and also help her appetite too.

## 2024-06-15 NOTE — Patient Instructions (Addendum)
  Thank you, Ms. [name], for allowing us  to provide your care today. We discussed your blood pressure and refilled your amlodipine  prescription. We also talked about your mental health, and it's encouraging that you're continuing therapy at the clinic with [facility]. Please continue taking mirtazapine  at night as prescribed.  Since you were short on time today, we've scheduled a follow-up appointment to check your BMP, update your vaccinations, and complete your Pap smear at your next visit.  We look forward to seeing you again and continuing to support your health and well-being   I have ordered the following labs for you:  Lab Orders  No laboratory test(s) ordered today      Referrals ordered today:   Referral Orders  No referral(s) requested today     I have ordered the following medication/changed the following medications:   Stop the following medications: Medications Discontinued During This Encounter  Medication Reason   amLODipine  (NORVASC ) 5 MG tablet Duplicate   mirtazapine  (REMERON ) 15 MG tablet Duplicate     Start the following medications: Meds ordered this encounter  Medications   amLODipine  (NORVASC ) 5 MG tablet    Sig: Take 1 tablet (5 mg total) by mouth daily.    Dispense:  90 tablet    Refill:  3   mirtazapine  (REMERON ) 15 MG tablet    Sig: Take 1 tablet (15 mg total) by mouth at bedtime.    Dispense:  90 tablet    Refill:  3     Follow up: 1 month Remember: take your Mirtazapine  at night  Should you have any questions or concerns please call the internal medicine clinic at [phone].    Armando Rossetti, M.D [facility]

## 2024-06-18 NOTE — Progress Notes (Signed)
Internal Medicine Clinic Attending  I was physically present during the key portions of the resident provided service and participated in the medical decision making of patient's management care. I reviewed pertinent patient test results.  The assessment, diagnosis, and plan were formulated together and I agree with the documentation in the resident's note.  Williams, Julie Anne, MD  

## 2024-06-25 ENCOUNTER — Telehealth: Payer: Self-pay | Admitting: *Deleted

## 2024-06-25 ENCOUNTER — Other Ambulatory Visit (HOSPITAL_COMMUNITY): Payer: Self-pay

## 2024-06-25 ENCOUNTER — Other Ambulatory Visit: Payer: Self-pay | Admitting: *Deleted

## 2024-06-25 ENCOUNTER — Other Ambulatory Visit: Payer: Self-pay

## 2024-06-25 DIAGNOSIS — J309 Allergic rhinitis, unspecified: Secondary | ICD-10-CM

## 2024-06-25 MED ORDER — AMLODIPINE BESYLATE 5 MG PO TABS
5.0000 mg | ORAL_TABLET | Freq: Every day | ORAL | 3 refills | Status: AC
  Filled 2024-06-25: qty 90, 90d supply, fill #0

## 2024-06-25 MED ORDER — CETIRIZINE HCL 10 MG PO TABS
10.0000 mg | ORAL_TABLET | Freq: Every day | ORAL | 3 refills | Status: AC
  Filled 2024-06-25: qty 90, 90d supply, fill #0

## 2024-06-25 MED ORDER — AMLODIPINE BESYLATE 5 MG PO TABS
5.0000 mg | ORAL_TABLET | Freq: Every day | ORAL | 3 refills | Status: DC
Start: 2024-06-25 — End: 2024-06-25

## 2024-06-25 MED ORDER — MIRTAZAPINE 15 MG PO TABS
15.0000 mg | ORAL_TABLET | Freq: Every day | ORAL | 3 refills | Status: DC
Start: 2024-06-25 — End: 2024-06-25

## 2024-06-25 MED ORDER — MIRTAZAPINE 15 MG PO TABS
15.0000 mg | ORAL_TABLET | Freq: Every day | ORAL | 3 refills | Status: AC
  Filled 2024-06-25: qty 90, 90d supply, fill #0

## 2024-06-25 NOTE — Telephone Encounter (Signed)
 Copied from CRM [id number]. Topic: Clinical - Prescription Issue >> Jun 25, 2024  1:59 PM Graeme ORN wrote: Reason for CRM: Patient called. States there is an issue with medication that was sent to pharmacy. Insurance will not cover it. Needs another provider to send. Pharmacy would like provider to contact them at [facility] to assist patient with getting medication. Thank you

## 2024-06-25 NOTE — Addendum Note (Signed)
 Addended by: CAMMIE GAETANA DEL on: 06/25/2024 02:48 PM   Modules accepted: Orders

## 2024-06-25 NOTE — Telephone Encounter (Signed)
 [facility] pharmacy stated Dr Azadegan is not Medicaid approved.

## 2024-06-26 ENCOUNTER — Other Ambulatory Visit (HOSPITAL_COMMUNITY): Payer: Self-pay

## 2024-06-26 ENCOUNTER — Other Ambulatory Visit: Payer: Self-pay

## 2024-08-11 ENCOUNTER — Emergency Department (HOSPITAL_COMMUNITY)
Admission: EM | Admit: 2024-08-11 | Discharge: 2024-08-12 | Discharge status: Against Medical Advice | Attending: Emergency Medicine | Admitting: Emergency Medicine

## 2024-08-11 ENCOUNTER — Other Ambulatory Visit: Payer: Self-pay

## 2024-08-11 DIAGNOSIS — Z5321 Procedure and treatment not carried out due to patient leaving prior to being seen by health care provider: Secondary | ICD-10-CM | POA: Insufficient documentation

## 2024-08-11 DIAGNOSIS — K0889 Other specified disorders of teeth and supporting structures: Secondary | ICD-10-CM | POA: Diagnosis not present

## 2024-08-11 NOTE — ED Triage Notes (Signed)
 Right dental pain for the past 2 weeks unable to get dentist appointment.

## 2024-08-12 NOTE — ED Notes (Signed)
 Pt stated she was leaving and will return tomorrow.

## 2025-01-05 ENCOUNTER — Telehealth: Payer: Self-pay

## 2025-01-05 NOTE — Telephone Encounter (Signed)
"   Called pt to  make her appt and do the 1st half of the COPD  questions  .the patient requested a call back  due to dealing with a family emergency  will call back in a few hours  "
